# Patient Record
Sex: Male | Born: 1935 | Race: White | Hispanic: No | Smoking: Former smoker
Health system: Southern US, Community
[De-identification: ages and names within clinical notes are randomized; demographics above are authoritative.]

## PROBLEM LIST (undated history)

## (undated) DIAGNOSIS — C3490 Malignant neoplasm of unspecified part of unspecified bronchus or lung: Secondary | ICD-10-CM

## (undated) DIAGNOSIS — K219 Gastro-esophageal reflux disease without esophagitis: Secondary | ICD-10-CM

## (undated) DIAGNOSIS — E785 Hyperlipidemia, unspecified: Secondary | ICD-10-CM

## (undated) DIAGNOSIS — J449 Chronic obstructive pulmonary disease, unspecified: Secondary | ICD-10-CM

## (undated) DIAGNOSIS — I509 Heart failure, unspecified: Secondary | ICD-10-CM

## (undated) DIAGNOSIS — I214 Non-ST elevation (NSTEMI) myocardial infarction: Secondary | ICD-10-CM

## (undated) HISTORY — PX: FRACTURE SURGERY: SHX138

## (undated) HISTORY — PX: ANKLE FRACTURE SURGERY: SHX122

## (undated) HISTORY — PX: CATARACT EXTRACTION: SUR2

## (undated) HISTORY — PX: LUNG BIOPSY: SHX232

---

## 2007-05-24 DIAGNOSIS — C3490 Malignant neoplasm of unspecified part of unspecified bronchus or lung: Secondary | ICD-10-CM

## 2007-05-24 HISTORY — DX: Malignant neoplasm of unspecified part of unspecified bronchus or lung: C34.90

## 2008-05-20 ENCOUNTER — Ambulatory Visit: Payer: Self-pay | Admitting: Cardiology

## 2008-11-10 ENCOUNTER — Ambulatory Visit: Payer: Self-pay | Admitting: Cardiology

## 2009-01-21 ENCOUNTER — Ambulatory Visit (HOSPITAL_COMMUNITY): Admission: RE | Admit: 2009-01-21 | Discharge: 2009-01-21 | Payer: Self-pay | Admitting: Optometry

## 2009-02-04 ENCOUNTER — Ambulatory Visit: Payer: Self-pay | Admitting: Thoracic Surgery

## 2009-02-10 ENCOUNTER — Encounter (INDEPENDENT_AMBULATORY_CARE_PROVIDER_SITE_OTHER): Payer: Self-pay | Admitting: Interventional Radiology

## 2009-02-10 ENCOUNTER — Ambulatory Visit (HOSPITAL_COMMUNITY): Admission: RE | Admit: 2009-02-10 | Discharge: 2009-02-10 | Payer: Self-pay | Admitting: Thoracic Surgery

## 2009-02-11 ENCOUNTER — Ambulatory Visit: Payer: Self-pay | Admitting: Thoracic Surgery

## 2009-02-25 ENCOUNTER — Ambulatory Visit: Admission: RE | Admit: 2009-02-25 | Discharge: 2009-03-31 | Payer: Self-pay | Admitting: Radiation Oncology

## 2009-05-14 ENCOUNTER — Ambulatory Visit: Admission: RE | Admit: 2009-05-14 | Discharge: 2009-05-14 | Payer: Self-pay | Admitting: Radiation Oncology

## 2009-05-14 LAB — CBC WITH DIFFERENTIAL/PLATELET
Basophils Absolute: 0 10*3/uL (ref 0.0–0.1)
Eosinophils Absolute: 0.1 10*3/uL (ref 0.0–0.5)
HCT: 49 % (ref 38.4–49.9)
HGB: 16.2 g/dL (ref 13.0–17.1)
LYMPH%: 17.6 % (ref 14.0–49.0)
MCV: 90.2 fL (ref 79.3–98.0)
MONO#: 0.7 10*3/uL (ref 0.1–0.9)
MONO%: 10.9 % (ref 0.0–14.0)
NEUT#: 4.2 10*3/uL (ref 1.5–6.5)
NEUT%: 69.2 % (ref 39.0–75.0)
Platelets: 156 10*3/uL (ref 140–400)
RBC: 5.43 10*6/uL (ref 4.20–5.82)
WBC: 6.1 10*3/uL (ref 4.0–10.3)

## 2009-05-14 LAB — BUN: BUN: 14 mg/dL (ref 6–23)

## 2010-08-27 LAB — CBC
HCT: 47.5 % (ref 39.0–52.0)
Hemoglobin: 15.8 g/dL (ref 13.0–17.0)
MCHC: 33.2 g/dL (ref 30.0–36.0)
MCV: 87.6 fL (ref 78.0–100.0)
RBC: 5.42 MIL/uL (ref 4.22–5.81)
RDW: 17.6 % — ABNORMAL HIGH (ref 11.5–15.5)

## 2010-10-05 NOTE — Letter (Signed)
February 11, 2009   Elise Benne, MD  [address], [address]  [place], [place] [address]   Re:  Paul Snyder, Paul Snyder                DOB:  02-Jan-1936   Dear Dr. Orson Aloe:   I saw the patient back for followup today.  He is doing remarkably well  after his needle biopsy and unfortunately the biopsy showed  adenocarcinoma.  Since his lung functions are borderline, I already had  recommended that he get SBRT treatment for this lung cancer, which is a  clinically stage I.  The patient is in agreement with this.  I will  refer him to Dr. Lurline Hare for evaluation.  I appreciate the  opportunity of seeing the patient.   Sincerely,   Paul Snyder, M.D.  Electronically Signed   DPB/MEDQ  D:  02/11/2009  T:  02/12/2009  Job:  [id number]

## 2010-10-05 NOTE — Letter (Signed)
February 04, 2009   Paul Snyder  [address] [place], [place] [address]   Re:  VIRLAN, KEMPKER                DOB:  March 07, 1936   Dear Dr. Orson Aloe:   I saw the patient in the office today.  This 75 year old patient has a  long history of smoking and has severe obstructive pulmonary disease  with an diffusion capacity of 40% and an FEV-1 of 1.02 or 33% predicted  with an FVC of 1.81 or 39% predicted.  According to your notes, he is a  GOLD stage III.  He gets short of breath when walking up stairs.  He is  found to have a left upper lobe 2 or 3-cm lesion.  PET scan is positive  with an standard uptake value of 2.9.  He has had no fever, chills,  excessive sputum, or no hemoptysis.  He is referred here for evaluation.   MEDICATIONS:  Aspirin, Spiriva, Advair, Lasix 20 mg, Simvastatin 40 mg,  and nystatin.   ALLERGIES:  He has no allergies.   PAST MEDICAL HISTORY:  He has hypercholesterolemia, congestive heart  failure, chronic obstructive pulmonary disease, coronary artery disease.   FAMILY HISTORY:  Noncontributory.   SOCIAL HISTORY:  He is married, he has 2 children, he is retired, quit  smoking in 1996 and does not drink alcohol on a regular basis.   REVIEW OF SYSTEMS:  VITAL SIGNS:  He is 6 feet.  He is 236 pounds, his  weight has been stable.  CARDIAC:  No angina or atrial fibrillation.  PULMONARY:  He has bronchitis.  GI:  No nausea, or vomiting.  No hiatal hernia.  GU:  No kidney disease, dysuria or frequent urination.  VASCULAR:  No claudication, DVT or TIAs.  NEUROLOGIC:  No dizziness headaches, blackouts, or seizures.  MUSCULOSKELETAL:  Some arthralgias and arthritis.  PSYCHIATRIC:  No depression or nervousness.  EYE/ENT:  No changes in his eyesight or hearing.  HEMATOLOGIC:  No problems with bleeding, clotting disorders or anemia.   PHYSICAL EXAMINATION:  Vital Signs:  His blood pressure is 145/79, pulse  69, respirations 18, sats were 90% on room  air.  Head, Eyes, Ears, Nose  and Throat:  Unremarkable.  Neck:  Supple without thyromegaly.  There is  no supraclavicular or axillary adenopathy.  Chest:  Clear to  auscultation and percussion.  Heart:  Regular sinus rhythm.  No murmurs.  Abdomen:  Soft.  There is no hepatosplenomegaly.  Extremities:  Pulses  are 2+.  There is no clubbing or edema.  Neurologic:  He is oriented x3.  Sense and motor intact.  Cranial nerves intact.   He probably has non-small-cell lung cancer but we need to prove this.  Since he had a negative bronchoscopy, I think the best way to prove this  is with a needle biopsy, although it does carry some risk of  pneumothorax and hemoptysis.  I have scheduled him to have this done  here at the [facility] with our radiologist.  If that is positive  then I will refer him for SBRT.  I think this type of radiation therapy  would be beneficial with minimum loss of his lung capacity.  I  appreciate the opportunity of seeing the patient.   Sincerely,   Ines Bloomer, M.D.  Electronically Signed   DPB/MEDQ  D:  02/04/2009  T:  02/05/2009  Job:  [id number]

## 2016-07-19 ENCOUNTER — Encounter (INDEPENDENT_AMBULATORY_CARE_PROVIDER_SITE_OTHER): Payer: Self-pay

## 2017-07-16 DIAGNOSIS — I214 Non-ST elevation (NSTEMI) myocardial infarction: Secondary | ICD-10-CM

## 2017-07-16 HISTORY — DX: Non-ST elevation (NSTEMI) myocardial infarction: I21.4

## 2017-07-19 ENCOUNTER — Encounter (HOSPITAL_COMMUNITY): Payer: Self-pay | Admitting: Emergency Medicine

## 2017-07-19 ENCOUNTER — Observation Stay (HOSPITAL_COMMUNITY)
Admission: EM | Admit: 2017-07-19 | Discharge: 2017-07-20 | Disposition: A | Discharge status: Home / Self Care | Payer: Medicare Other | Attending: Interventional Cardiology | Admitting: Interventional Cardiology

## 2017-07-19 ENCOUNTER — Ambulatory Visit (HOSPITAL_COMMUNITY)
Admission: EM | Disposition: A | Discharge status: Home / Self Care | Payer: Self-pay | Source: Home / Self Care | Attending: Emergency Medicine

## 2017-07-19 ENCOUNTER — Other Ambulatory Visit: Payer: Self-pay

## 2017-07-19 ENCOUNTER — Emergency Department (HOSPITAL_COMMUNITY): Payer: Medicare Other

## 2017-07-19 DIAGNOSIS — I2584 Coronary atherosclerosis due to calcified coronary lesion: Secondary | ICD-10-CM | POA: Insufficient documentation

## 2017-07-19 DIAGNOSIS — I252 Old myocardial infarction: Secondary | ICD-10-CM | POA: Diagnosis present

## 2017-07-19 DIAGNOSIS — I4510 Unspecified right bundle-branch block: Secondary | ICD-10-CM | POA: Insufficient documentation

## 2017-07-19 DIAGNOSIS — Z85118 Personal history of other malignant neoplasm of bronchus and lung: Secondary | ICD-10-CM | POA: Diagnosis not present

## 2017-07-19 DIAGNOSIS — I2510 Atherosclerotic heart disease of native coronary artery without angina pectoris: Secondary | ICD-10-CM | POA: Diagnosis not present

## 2017-07-19 DIAGNOSIS — Z7982 Long term (current) use of aspirin: Secondary | ICD-10-CM | POA: Diagnosis not present

## 2017-07-19 DIAGNOSIS — I509 Heart failure, unspecified: Secondary | ICD-10-CM | POA: Diagnosis not present

## 2017-07-19 DIAGNOSIS — I214 Non-ST elevation (NSTEMI) myocardial infarction: Principal | ICD-10-CM | POA: Insufficient documentation

## 2017-07-19 DIAGNOSIS — Z87891 Personal history of nicotine dependence: Secondary | ICD-10-CM | POA: Diagnosis not present

## 2017-07-19 DIAGNOSIS — Z955 Presence of coronary angioplasty implant and graft: Secondary | ICD-10-CM

## 2017-07-19 DIAGNOSIS — I10 Essential (primary) hypertension: Secondary | ICD-10-CM

## 2017-07-19 DIAGNOSIS — Z79899 Other long term (current) drug therapy: Secondary | ICD-10-CM | POA: Insufficient documentation

## 2017-07-19 DIAGNOSIS — Z8249 Family history of ischemic heart disease and other diseases of the circulatory system: Secondary | ICD-10-CM | POA: Insufficient documentation

## 2017-07-19 DIAGNOSIS — E782 Mixed hyperlipidemia: Secondary | ICD-10-CM | POA: Diagnosis not present

## 2017-07-19 DIAGNOSIS — E785 Hyperlipidemia, unspecified: Secondary | ICD-10-CM | POA: Insufficient documentation

## 2017-07-19 HISTORY — PX: CORONARY ANGIOPLASTY WITH STENT PLACEMENT: SHX49

## 2017-07-19 HISTORY — DX: Hyperlipidemia, unspecified: E78.5

## 2017-07-19 HISTORY — DX: Heart failure, unspecified: I50.9

## 2017-07-19 HISTORY — PX: CORONARY STENT INTERVENTION: CATH118234

## 2017-07-19 HISTORY — DX: Gastro-esophageal reflux disease without esophagitis: K21.9

## 2017-07-19 HISTORY — PX: LEFT HEART CATH AND CORONARY ANGIOGRAPHY: CATH118249

## 2017-07-19 HISTORY — DX: Malignant neoplasm of unspecified part of unspecified bronchus or lung: C34.90

## 2017-07-19 HISTORY — DX: Chronic obstructive pulmonary disease, unspecified: J44.9

## 2017-07-19 HISTORY — DX: Non-ST elevation (NSTEMI) myocardial infarction: I21.4

## 2017-07-19 LAB — BASIC METABOLIC PANEL
Anion gap: 12 (ref 5–15)
BUN: 12 mg/dL (ref 6–20)
CHLORIDE: 101 mmol/L (ref 101–111)
CO2: 26 mmol/L (ref 22–32)
CREATININE: 1.05 mg/dL (ref 0.61–1.24)
Calcium: 9.5 mg/dL (ref 8.9–10.3)
GFR calc Af Amer: >60 mL/min (ref >60)
GFR calc non Af Amer: >60 mL/min (ref >60)
GLUCOSE: 105 mg/dL — AB (ref 65–99)
POTASSIUM: 3.6 mmol/L (ref 3.5–5.1)
SODIUM: 139 mmol/L (ref 135–145)

## 2017-07-19 LAB — CBC
HEMATOCRIT: 46.4 % (ref 39.0–52.0)
Hemoglobin: 15 g/dL (ref 13.0–17.0)
MCH: 29.4 pg (ref 26.0–34.0)
MCHC: 32.3 g/dL (ref 30.0–36.0)
MCV: 91 fL (ref 78.0–100.0)
PLATELETS: 208 10*3/uL (ref 150–400)
RBC: 5.1 MIL/uL (ref 4.22–5.81)
RDW: 13.5 % (ref 11.5–15.5)
WBC: 6.8 10*3/uL (ref 4.0–10.5)

## 2017-07-19 LAB — TROPONIN I: TROPONIN I: 0.14 ng/mL — AB (ref <0.03)

## 2017-07-19 LAB — I-STAT TROPONIN, ED: Troponin i, poc: 0.1 ng/mL (ref 0.00–0.08)

## 2017-07-19 SURGERY — LEFT HEART CATH AND CORONARY ANGIOGRAPHY
Anesthesia: LOCAL

## 2017-07-19 MED ORDER — LIDOCAINE HCL (PF) 1 % IJ SOLN
INTRAMUSCULAR | Status: DC | PRN
  Administered 2017-07-19: 2 mL

## 2017-07-19 MED ORDER — SODIUM CHLORIDE 0.9% FLUSH: 3.0000 mL | INTRAVENOUS | Status: DC | PRN

## 2017-07-19 MED ORDER — VERAPAMIL HCL 2.5 MG/ML IV SOLN
INTRAVENOUS | Status: DC | PRN
  Administered 2017-07-19: 10 mL via INTRA_ARTERIAL

## 2017-07-19 MED ORDER — LIDOCAINE HCL 1 % IJ SOLN
INTRAMUSCULAR | Status: AC
  Filled 2017-07-19: qty 20

## 2017-07-19 MED ORDER — ASPIRIN EC 81 MG PO TBEC
81.0000 mg | DELAYED_RELEASE_TABLET | Freq: Every day | ORAL | Status: DC
  Administered 2017-07-20: 10:00:00 81 mg via ORAL
  Filled 2017-07-19: qty 1

## 2017-07-19 MED ORDER — SODIUM CHLORIDE 0.9% FLUSH: 3.0000 mL | Freq: Two times a day (BID) | INTRAVENOUS | Status: DC

## 2017-07-19 MED ORDER — IOPAMIDOL (ISOVUE-370) INJECTION 76%
INTRAVENOUS | Status: AC
  Filled 2017-07-19: qty 100

## 2017-07-19 MED ORDER — HEPARIN (PORCINE) IN NACL 2-0.9 UNIT/ML-% IJ SOLN
INTRAMUSCULAR | Status: AC
  Filled 2017-07-19: qty 1000

## 2017-07-19 MED ORDER — IOPAMIDOL (ISOVUE-370) INJECTION 76%
INTRAVENOUS | Status: DC | PRN
  Administered 2017-07-19: 175 mL via INTRA_ARTERIAL

## 2017-07-19 MED ORDER — ATORVASTATIN CALCIUM 80 MG PO TABS: 80.0000 mg | ORAL_TABLET | Freq: Every day | ORAL | Status: DC

## 2017-07-19 MED ORDER — TICAGRELOR 90 MG PO TABS
ORAL_TABLET | ORAL | Status: DC | PRN
  Administered 2017-07-19: 180 mg via ORAL

## 2017-07-19 MED ORDER — ONDANSETRON HCL 4 MG/2ML IJ SOLN: 4.0000 mg | Freq: Four times a day (QID) | INTRAMUSCULAR | Status: DC | PRN

## 2017-07-19 MED ORDER — MIDAZOLAM HCL 2 MG/2ML IJ SOLN
INTRAMUSCULAR | Status: AC
  Filled 2017-07-19: qty 2

## 2017-07-19 MED ORDER — BIVALIRUDIN TRIFLUOROACETATE 250 MG IV SOLR
INTRAVENOUS | Status: AC
  Filled 2017-07-19: qty 250

## 2017-07-19 MED ORDER — ACETAMINOPHEN 325 MG PO TABS: 650.0000 mg | ORAL_TABLET | ORAL | Status: DC | PRN

## 2017-07-19 MED ORDER — SODIUM CHLORIDE 0.9 % IV SOLN: INTRAVENOUS | Status: DC

## 2017-07-19 MED ORDER — SIMVASTATIN 40 MG PO TABS: 40.0000 mg | ORAL_TABLET | Freq: Every day | ORAL | Status: DC

## 2017-07-19 MED ORDER — TICAGRELOR 90 MG PO TABS
90.0000 mg | ORAL_TABLET | Freq: Two times a day (BID) | ORAL | Status: DC
  Administered 2017-07-20: 06:00:00 90 mg via ORAL
  Filled 2017-07-19: qty 1

## 2017-07-19 MED ORDER — HEART ATTACK BOUNCING BOOK
Freq: Once | Status: AC
  Administered 2017-07-19: 1
  Filled 2017-07-19: qty 1

## 2017-07-19 MED ORDER — HEPARIN SODIUM (PORCINE) 1000 UNIT/ML IJ SOLN
INTRAMUSCULAR | Status: DC | PRN
  Administered 2017-07-19: 4200 [IU] via INTRAVENOUS

## 2017-07-19 MED ORDER — NITROGLYCERIN 0.4 MG SL SUBL: 0.4000 mg | SUBLINGUAL_TABLET | SUBLINGUAL | Status: DC | PRN

## 2017-07-19 MED ORDER — ALBUTEROL SULFATE (2.5 MG/3ML) 0.083% IN NEBU
2.5000 mL | INHALATION_SOLUTION | Freq: Four times a day (QID) | RESPIRATORY_TRACT | Status: DC
  Administered 2017-07-19: 2.5 mL via RESPIRATORY_TRACT
  Filled 2017-07-19 (×3): qty 3

## 2017-07-19 MED ORDER — ANGIOPLASTY BOOK
Freq: Once | Status: AC
  Administered 2017-07-19: 1
  Filled 2017-07-19: qty 1

## 2017-07-19 MED ORDER — SODIUM CHLORIDE 0.9 % IV SOLN
INTRAVENOUS | Status: AC | PRN
  Administered 2017-07-19 (×2): 1.75 mg/kg/h via INTRAVENOUS

## 2017-07-19 MED ORDER — FENTANYL CITRATE (PF) 100 MCG/2ML IJ SOLN
INTRAMUSCULAR | Status: AC
  Filled 2017-07-19: qty 2

## 2017-07-19 MED ORDER — HYDRALAZINE HCL 20 MG/ML IJ SOLN: 5.0000 mg | INTRAMUSCULAR | Status: AC | PRN

## 2017-07-19 MED ORDER — HEPARIN BOLUS VIA INFUSION
4000.0000 [IU] | Freq: Once | INTRAVENOUS | Status: DC
  Filled 2017-07-19: qty 4000

## 2017-07-19 MED ORDER — METOPROLOL TARTRATE 12.5 MG HALF TABLET
12.5000 mg | ORAL_TABLET | Freq: Two times a day (BID) | ORAL | Status: DC
  Administered 2017-07-19 – 2017-07-20 (×2): 12.5 mg via ORAL
  Filled 2017-07-19 (×2): qty 1

## 2017-07-19 MED ORDER — MIDAZOLAM HCL 2 MG/2ML IJ SOLN
INTRAMUSCULAR | Status: DC | PRN
  Administered 2017-07-19 (×2): 1 mg via INTRAVENOUS

## 2017-07-19 MED ORDER — ENSURE ENLIVE PO LIQD
237.0000 mL | Freq: Two times a day (BID) | ORAL | Status: DC
  Administered 2017-07-20: 10:00:00 237 mL via ORAL
  Filled 2017-07-19 (×4): qty 237

## 2017-07-19 MED ORDER — HEPARIN SODIUM (PORCINE) 1000 UNIT/ML IJ SOLN
INTRAMUSCULAR | Status: AC
  Filled 2017-07-19: qty 1

## 2017-07-19 MED ORDER — ASPIRIN 81 MG PO CHEW
81.0000 mg | CHEWABLE_TABLET | Freq: Every day | ORAL | Status: DC
  Filled 2017-07-19: qty 1

## 2017-07-19 MED ORDER — BIVALIRUDIN BOLUS VIA INFUSION - CUPID
INTRAVENOUS | Status: DC | PRN
  Administered 2017-07-19: 62.925 mg via INTRAVENOUS

## 2017-07-19 MED ORDER — NITROGLYCERIN 1 MG/10 ML FOR IR/CATH LAB
INTRA_ARTERIAL | Status: DC | PRN
  Administered 2017-07-19 (×4): 200 ug via INTRACORONARY

## 2017-07-19 MED ORDER — LABETALOL HCL 5 MG/ML IV SOLN: 10.0000 mg | INTRAVENOUS | Status: AC | PRN

## 2017-07-19 MED ORDER — SODIUM CHLORIDE 0.9 % IV SOLN: 250.0000 mL | INTRAVENOUS | Status: DC | PRN

## 2017-07-19 MED ORDER — VERAPAMIL HCL 2.5 MG/ML IV SOLN
INTRAVENOUS | Status: AC
  Filled 2017-07-19: qty 2

## 2017-07-19 MED ORDER — ACETAMINOPHEN 325 MG PO TABS
650.0000 mg | ORAL_TABLET | ORAL | Status: DC | PRN
  Administered 2017-07-19: 650 mg via ORAL
  Filled 2017-07-19: qty 2

## 2017-07-19 MED ORDER — FENTANYL CITRATE (PF) 100 MCG/2ML IJ SOLN
INTRAMUSCULAR | Status: DC | PRN
  Administered 2017-07-19 (×2): 25 ug via INTRAVENOUS

## 2017-07-19 MED ORDER — HEPARIN (PORCINE) IN NACL 100-0.45 UNIT/ML-% IJ SOLN
1050.0000 [IU]/h | INTRAMUSCULAR | Status: DC
  Filled 2017-07-19: qty 250

## 2017-07-19 MED ORDER — NITROGLYCERIN 1 MG/10 ML FOR IR/CATH LAB
INTRA_ARTERIAL | Status: AC
  Filled 2017-07-19: qty 10

## 2017-07-19 MED ORDER — SODIUM CHLORIDE 0.9 % IV SOLN
INTRAVENOUS | Status: AC | PRN
  Administered 2017-07-19: 75 mL/h via INTRAVENOUS

## 2017-07-19 SURGICAL SUPPLY — 16 items
BALLN EMERGE MR 2.5X12 (BALLOONS) ×2
BALLN SAPPHIRE ~~LOC~~ 3.25X12 (BALLOONS) ×2 IMPLANT
BALLOON EMERGE MR 2.5X12 (BALLOONS) ×1 IMPLANT
CATH OPTITORQUE TIG 4.0 5F (CATHETERS) ×2 IMPLANT
CATH VISTA GUIDE 6FR XB3.5 (CATHETERS) ×2 IMPLANT
DEVICE RAD COMP TR BAND LRG (VASCULAR PRODUCTS) ×2 IMPLANT
GLIDESHEATH SLEND SS 6F .021 (SHEATH) ×2 IMPLANT
GUIDEWIRE INQWIRE 1.5J.035X260 (WIRE) ×1 IMPLANT
INQWIRE 1.5J .035X260CM (WIRE) ×2
KIT ENCORE 26 ADVANTAGE (KITS) ×2 IMPLANT
KIT HEART LEFT (KITS) ×2 IMPLANT
PACK CARDIAC CATHETERIZATION (CUSTOM PROCEDURE TRAY) ×2 IMPLANT
STENT SYNERGY DES 3X16 (Permanent Stent) ×2 IMPLANT
TRANSDUCER W/STOPCOCK (MISCELLANEOUS) ×2 IMPLANT
TUBING CIL FLEX 10 FLL-RA (TUBING) ×2 IMPLANT
WIRE PT2 MS 185 (WIRE) ×2 IMPLANT

## 2017-07-19 NOTE — Interval H&P Note (Signed)
Cath Lab Visit (complete for each Cath Lab visit)  Clinical Evaluation Leading to the Procedure:   ACS: Yes.    Non-ACS:    Anginal Classification: CCS IV  Anti-ischemic medical therapy: No Therapy  Non-Invasive Test Results: No non-invasive testing performed  Prior CABG: No previous CABG      History and Physical Interval Note:  07/19/2017 4:26 PM  Paul Snyder  has presented today for surgery, with the diagnosis of cp  The various methods of treatment have been discussed with the patient and family. After consideration of risks, benefits and other options for treatment, the patient has consented to  Procedure(s): LEFT HEART CATH AND CORONARY ANGIOGRAPHY (N/A) as a surgical intervention .  The patient's history has been reviewed, patient examined, no change in status, stable for surgery.  I have reviewed the patient's chart and labs.  Questions were answered to the patient's satisfaction.     Shelva Majestic

## 2017-07-19 NOTE — Progress Notes (Signed)
ANTICOAGULATION CONSULT NOTE - Initial Consult  Pharmacy Consult for heparin Indication: chest pain/ACS  Not on File  Patient Measurements: Height: 6' (182.9 cm) Weight: 185 lb (83.9 kg) IBW/kg (Calculated) : 77.6 Heparin Dosing Weight: 84kg  Vital Signs: Temp: 97.5 F (36.4 C) (02/27 1128) Temp Source: Oral (02/27 1128) BP: 169/65 (02/27 1530) Pulse Rate: 58 (02/27 1530)  Labs: Recent Labs    07/19/17 1142  HGB 15.0  HCT 46.4  PLT 208  CREATININE 1.05    Estimated Creatinine Clearance: 61.6 mL/min (by C-G formula based on SCr of 1.05 mg/dL). Assessment:  58 YOM here with chest pain that started on Sunday. Cardiology planning to take him for cath. No anticoagulation PTA.  Goal of Therapy:  Heparin level 0.3-0.7 units/ml Monitor platelets by anticoagulation protocol: Yes   Plan:  Heparin bolus 4000 units IV x1, then start infusion at 1050 units/hr Heparin level in 8h Daily heparin level and CBC Follow post-cath  Liev Brockbank D. Damian Hofstra, PharmD, BCPS Clinical Pharmacist 07/19/2017 4:24 PM

## 2017-07-19 NOTE — ED Notes (Addendum)
Patient states that he had chest pain off and on Saturday and Sunday night.  Chest pain has not occurred since.  Describes pain as tightness.  Denies any chest pain at this moment.  Denies worsening SOB, lightheadedness, or diaphoresis.  Does say that when he gets up in the morning, he feels "swimmy headed".

## 2017-07-19 NOTE — H&P (Addendum)
History & Physical    Patient ID: Paul Snyder MRN: [id number], DOB/AGE: 07/27/1935   Admit date: 07/19/2017  Primary Physician: System, Pcp Not In Primary Cardiologist: New   Patient Profile    82 yo male with PMH of HL, CHF, remote Lung CA with left sided thoracotomy and remote tobacco use who presented with chest pain.   Past Medical History   Past Medical History:  Diagnosis Date  . Cancer ([place]) 2009   lung  . CHF (congestive heart failure) ([place])   . Hyperlipidemia     History reviewed. No pertinent surgical history.   Allergies  Not on File  History of Present Illness    Mr. Maraj is an 82 yo male with PMH of HL, CHF, remote Lung CA with left sided thoracotomy and tobacco use. Reports he was seen by a cardiologist back in the 90s and told he had CHF. No cardiac issues since that time. Followed by his PCP. Currently lives at home. Works around this home, mows grass, and works in his garden. Does not normally have any anginal symptoms.   Saturday night he was awoken with left sided chest pain that lasted a couple of minutes, subsided and he was able to fall back asleep. Sunday afternoon took a nap around noon and woke up with chest tightness again, but subsided quickly. Then Sunday night had terrible chest tightness, felt like he may die. Prayed and the pain went away in several minutes. Told his children about his symptoms and they brought him to the ED today for evaluation. No further chest pain since Sunday night. Does report family hx of CAD with brother having stents in his early 72s.   In the ED his labs showed stable electrolytes, Cr 1.05, POC trop 0.10, Hgb 15.0. CXR without acute edema. EKG showed SB with RBBB and 1st degree AVB. No chest pain at the time of exam.   Home Medications    Prior to Admission medications   Medication Sig Start Date End Date Taking? Authorizing Provider  albuterol (PROVENTIL HFA;VENTOLIN HFA) 108 (90 Base) MCG/ACT inhaler Inhale 2  puffs into the lungs 4 (four) times daily.    Yes [provider]  aspirin 325 MG EC tablet Take 325 mg by mouth daily.   Yes [provider]  furosemide (LASIX) 20 MG tablet Take 20 mg by mouth daily.   Yes [provider]  meclizine (ANTIVERT) 25 MG tablet Take 25 mg by mouth daily as needed for dizziness. 06/06/17  Yes [provider]  niacin 500 MG tablet Take 500 mg by mouth daily.    Yes [provider]  pantoprazole (PROTONIX) 40 MG tablet Take 40 mg by mouth daily.   Yes [provider]  simvastatin (ZOCOR) 40 MG tablet Take 40 mg by mouth at bedtime.    Yes [provider]    Family History    Family History  Problem Relation Age of Onset  . CAD Brother     Social History    Social History   Socioeconomic History  . Marital status: Married    Spouse name: Not on file  . Number of children: Not on file  . Years of education: Not on file  . Highest education level: Not on file  Social Needs  . Financial resource strain: Not on file  . Food insecurity - worry: Not on file  . Food insecurity - inability: Not on file  . Transportation needs - medical:  Not on file  . Transportation needs - non-medical: Not on file  Occupational History  . Not on file  Tobacco Use  . Smoking status: Former Smoker    Types: Cigarettes    Last attempt to quit: 1996    Years since quitting: 23.1  . Smokeless tobacco: Never Used  Substance and Sexual Activity  . Alcohol use: No    Frequency: Never  . Drug use: No  . Sexual activity: No  Other Topics Concern  . Not on file  Social History Narrative  . Not on file     Review of Systems    See HPI  All other systems reviewed and are otherwise negative except as noted above.  Physical Exam    Blood pressure (!) 169/65, pulse (!) 58, temperature (!) 97.5 F (36.4 C), temperature source Oral, resp. rate 16, height 6' (1.829 m), weight 185 lb (83.9 kg), SpO2 97 %.    General: Pleasant, NAD Psych: Normal affect. Neuro: Alert and oriented X 3. Moves all extremities spontaneously. HEENT: Normal  Neck: Supple without bruits or JVD. Lungs:  Resp regular and unlabored, crackles in the lower lobes. Heart: RRR no s3, s4, or murmurs. Abdomen: Soft, non-tender, non-distended, BS + x 4.  Extremities: No clubbing, cyanosis or edema. DP/PT/Radials 2+ and equal bilaterally.  Labs    Troponin [facility] Test) Recent Labs    07/19/17 1212  TROPIPOC 0.10*   No results for input(s): CKTOTAL, CKMB, TROPONINI in the last 72 hours. Lab Results  Component Value Date   WBC 6.8 07/19/2017   HGB 15.0 07/19/2017   HCT 46.4 07/19/2017   MCV 91.0 07/19/2017   PLT 208 07/19/2017    Recent Labs  Lab 07/19/17 1142  NA 139  K 3.6  CL 101  CO2 26  BUN 12  CREATININE 1.05  CALCIUM 9.5  GLUCOSE 105*   No results found for: CHOL, HDL, LDLCALC, TRIG No results found for: [facility]   Radiology Studies    Dg Chest 2 View  Result Date: 07/19/2017 CLINICAL DATA:  Chest pain and productive cough for 2-3 weeks. EXAM: CHEST  2 VIEW COMPARISON:  Chest CT 01/13/2011 FINDINGS: The cardiac silhouette, mediastinal and hilar contours are within normal limits and stable. There is mild tortuosity and calcification of the thoracic aorta. Emphysematous changes are noted with hyperinflation and attenuation of the pulmonary vasculature. Biapical pleural and parenchymal scarring changes. Evidence of prior left-sided thoracotomy and radiation changes in the left upper lobe. When compared to the prior scout film from the CT scan of 2012 there appears to be slightly more rounded fullness in the area of radiation change. Could not exclude the possibility of recurrent tumor. Chest CT with contrast may be helpful for further evaluation. No infiltrates or effusions. The bony thorax is intact. Stable appearing lower thoracic compression fractures are noted. IMPRESSION: No active cardiopulmonary  disease. Electronically Signed   By: Marijo Sanes M.D.   On: 07/19/2017 12:17    ECG & Cardiac Imaging    EKG: SB with RBBB, LAFB and 1st degree AVB  Assessment & Plan    82 yo male with PMH of HL, CHF, remote Lung CA with left sided thoracotomy and remote tobacco use who presented with chest pain.   1. NSTEMI: Symptoms concerning for ACS. Family hx of CAD, with RFs of remote tobacco use, and HL. POC trop 0.10. Given findings will proceed with cardiac cath. The patient understands that risks included but are  not limited to stroke (1 in 1000), death (1 in 58), kidney failure [usually temporary] (1 in 500), bleeding (1 in 200), allergic reaction [possibly serious] (1 in 200).  -- IV heparin -- cycle troponins -- check echo  2. HL: on simvastatin 40mg  daily -- check lipids in am  3. CHF: reported back in the early 90s. No overt signs of volume overload on exam.  -- check echo  4. Remote hx of Lung Ca s/p left sided thoracotomy   Signed, Reino Bellis, NP-C Pager [phone] 07/19/2017, 4:02 PM   I have examined the patient and reviewed assessment and plan and discussed with patient.  Agree with above as stated.  NSTEMI.  Discussed cath with the patient.  He is willing to proceed. Cardiac catheterization was discussed with the patient fully. The patient understands that risks include but are not limited to stroke (1 in 1000), death (1 in 2), kidney failure [usually temporary] (1 in 500), bleeding (1 in 200), allergic reaction [possibly serious] (1 in 200).  The patient understands and is willing to proceed.    All questions answered.   Start IV heparin.  Right radial pulse palpable, 2+.    H/o CHF. Medical treatment for hyperlipidemia.  Larae Grooms

## 2017-07-19 NOTE — ED Triage Notes (Signed)
Per Pt; Chest tightness started Saturday PM. Pain started to get better sun morning but returned worse Sun night. Since Sun night pt reports no pain/tighness. Pt A&Ox4, ambulatory, Hx of CHF and took 324 of ASP this AM.  R and Central Chest.

## 2017-07-19 NOTE — ED Provider Notes (Signed)
[facility] EMERGENCY DEPARTMENT Provider Note   CSN: [id number] Arrival date & time: 07/19/17  1120     History   Chief Complaint Chief Complaint  Patient presents with  . Chest Pain    HPI Paul Snyder is a 82 y.o. male.  Chief complaint is chest pain 2 days ago.  HPI 82 year old male.  Distant history of tobacco use.  He stopped in 1993 after an episode of CHF.  Also history of lung cancer status post left thoracotomy and resection.  No history of known coronary artery disease.  Today is Wednesday.  He states on Saturday night he had an episode of pain in his anterior chest that radiated to the base of his neck.  This happened again Sunday night and was "much much worse".  He states that he prayed "that if it was going to me and by the time that the Reita Cliche take me quickly".  He has been pain-free since but told his family about it last night and presents now for evaluation.  Past Medical History:  Diagnosis Date  . Cancer ([place]) 2009   lung  . CHF (congestive heart failure) ([place])   . Hyperlipidemia     Patient Active Problem List   Diagnosis Date Noted  . NSTEMI (non-ST elevated myocardial infarction) ([place]) 07/19/2017    History reviewed. No pertinent surgical history.     Home Medications    Prior to Admission medications   Medication Sig Start Date End Date Taking? Authorizing Provider  albuterol (PROVENTIL HFA;VENTOLIN HFA) 108 (90 Base) MCG/ACT inhaler Inhale 2 puffs into the lungs 4 (four) times daily.    Yes [provider]  aspirin 325 MG EC tablet Take 325 mg by mouth daily.   Yes [provider]  furosemide (LASIX) 20 MG tablet Take 20 mg by mouth daily.   Yes [provider]  meclizine (ANTIVERT) 25 MG tablet Take 25 mg by mouth daily as needed for dizziness. 06/06/17  Yes [provider]  niacin 500 MG tablet Take 500 mg by mouth daily.    Yes [provider]  pantoprazole (PROTONIX) 40 MG  tablet Take 40 mg by mouth daily.   Yes [provider]  simvastatin (ZOCOR) 40 MG tablet Take 40 mg by mouth at bedtime.    Yes [provider]    Family History Family History  Problem Relation Age of Onset  . CAD Brother     Social History Social History   Tobacco Use  . Smoking status: Former Smoker    Types: Cigarettes    Last attempt to quit: 1996    Years since quitting: 23.1  . Smokeless tobacco: Never Used  Substance Use Topics  . Alcohol use: No    Frequency: Never  . Drug use: No     Allergies   Patient has no allergy information on record.   Review of Systems Review of Systems  Constitutional: Negative for appetite change, chills, diaphoresis, fatigue and fever.  HENT: Negative for mouth sores, sore throat and trouble swallowing.   Eyes: Negative for visual disturbance.  Respiratory: Negative for cough, chest tightness, shortness of breath and wheezing.   Cardiovascular: Positive for chest pain.  Gastrointestinal: Negative for abdominal distention, abdominal pain, diarrhea, nausea and vomiting.  Endocrine: Negative for polydipsia, polyphagia and polyuria.  Genitourinary: Negative for dysuria, frequency and hematuria.  Musculoskeletal: Negative for gait problem.  Skin: Negative for color change, pallor and rash.  Neurological: Negative for  dizziness, syncope, light-headedness and headaches.  Hematological: Does not bruise/bleed easily.  Psychiatric/Behavioral: Negative for behavioral problems and confusion.     Physical Exam Updated Vital Signs BP (!) 169/65   Pulse (!) 58   Temp (!) 97.5 F (36.4 C) (Oral)   Resp 16   Ht 6' (1.829 m)   Wt 83.9 kg (185 lb)   SpO2 97%   BMI 25.09 kg/m   Physical Exam  Constitutional: He is oriented to person, place, and time. He appears well-developed and well-nourished. No distress.  HENT:  Head: Normocephalic.  Eyes: Conjunctivae are normal. Pupils are equal, round, and reactive to light.  No scleral icterus.  Neck: Normal range of motion. Neck supple. No thyromegaly present.  Cardiovascular: Normal rate and regular rhythm. Exam reveals no gallop and no friction rub.  No murmur heard. Pulmonary/Chest: Effort normal and breath sounds normal. No respiratory distress. He has no wheezes. He has no rales.  Abdominal: Soft. Bowel sounds are normal. He exhibits no distension. There is no tenderness. There is no rebound.  Musculoskeletal: Normal range of motion.  Neurological: He is alert and oriented to person, place, and time.  Skin: Skin is warm and dry. No rash noted.  Psychiatric: He has a normal mood and affect. His behavior is normal.     ED Treatments / Results  Labs (all labs ordered are listed, but only abnormal results are displayed) Labs Reviewed  BASIC METABOLIC PANEL - Abnormal; Notable for the following components:      Result Value   Glucose, Bld 105 (*)    All other components within normal limits  I-STAT TROPONIN, ED - Abnormal; Notable for the following components:   Troponin i, poc 0.10 (*)    All other components within normal limits  CBC  TROPONIN I  TROPONIN I  TROPONIN I    EKG   Radiology Dg Chest 2 View  Result Date: 07/19/2017 CLINICAL DATA:  Chest pain and productive cough for 2-3 weeks. EXAM: CHEST  2 VIEW COMPARISON:  Chest CT 01/13/2011 FINDINGS: The cardiac silhouette, mediastinal and hilar contours are within normal limits and stable. There is mild tortuosity and calcification of the thoracic aorta. Emphysematous changes are noted with hyperinflation and attenuation of the pulmonary vasculature. Biapical pleural and parenchymal scarring changes. Evidence of prior left-sided thoracotomy and radiation changes in the left upper lobe. When compared to the prior scout film from the CT scan of 2012 there appears to be slightly more rounded fullness in the area of radiation change. Could not exclude the possibility of recurrent tumor. Chest CT with  contrast may be helpful for further evaluation. No infiltrates or effusions. The bony thorax is intact. Stable appearing lower thoracic compression fractures are noted. IMPRESSION: No active cardiopulmonary disease. Electronically Signed   By: Marijo Sanes M.D.   On: 07/19/2017 12:17    Procedures Procedures (including critical care time)  Medications Ordered in ED Medications  albuterol (PROVENTIL HFA;VENTOLIN HFA) 108 (90 Base) MCG/ACT inhaler 2 puff (not administered)  simvastatin (ZOCOR) tablet 40 mg (not administered)  aspirin EC tablet 81 mg (not administered)  nitroGLYCERIN (NITROSTAT) SL tablet 0.4 mg (not administered)  acetaminophen (TYLENOL) tablet 650 mg (not administered)  ondansetron (ZOFRAN) injection 4 mg (not administered)     Initial Impression / Assessment and Plan / ED Course  I have reviewed the triage vital signs and the nursing notes.  Pertinent labs & imaging results that were available during my care of the patient were  reviewed by me and considered in my medical decision making (see chart for details).  EKG Interpretation  Date/Time:  Wednesday July 19 2017 11:30:39 EST Ventricular Rate:  59 PR Interval:  232 QRS Duration: 154 QT Interval:  440 QTC Calculation: 435 R Axis:   -78 Text Interpretation: Bifascicular block.  No comparison.  No obvious ST elevation or concordant changes.     Pain-free now.  Elevated troponin.  Probable N STEMI.  EKG shows bifascicular block.  I contacted his primary care physician Dr. Edrick Oh in [place].  He does not have old EKG, nor is there one in our system.  He has had aspirin today and symptom-free here.  Care discussed with cardiology.  They are planning admission for coronary angiography.  Final Clinical Impressions(s) / ED Diagnoses   Final diagnoses:  NSTEMI (non-ST elevated myocardial infarction) [facility])    ED Discharge Orders    None       Tanna Furry, MD 07/19/17 [phone]

## 2017-07-20 ENCOUNTER — Encounter (HOSPITAL_COMMUNITY): Payer: Self-pay | Admitting: Cardiovascular Disease

## 2017-07-20 ENCOUNTER — Observation Stay (HOSPITAL_BASED_OUTPATIENT_CLINIC_OR_DEPARTMENT_OTHER): Payer: Medicare Other

## 2017-07-20 DIAGNOSIS — I10 Essential (primary) hypertension: Secondary | ICD-10-CM

## 2017-07-20 DIAGNOSIS — I214 Non-ST elevation (NSTEMI) myocardial infarction: Secondary | ICD-10-CM | POA: Diagnosis not present

## 2017-07-20 DIAGNOSIS — E782 Mixed hyperlipidemia: Secondary | ICD-10-CM | POA: Diagnosis not present

## 2017-07-20 DIAGNOSIS — E785 Hyperlipidemia, unspecified: Secondary | ICD-10-CM | POA: Diagnosis not present

## 2017-07-20 DIAGNOSIS — I2584 Coronary atherosclerosis due to calcified coronary lesion: Secondary | ICD-10-CM | POA: Diagnosis not present

## 2017-07-20 DIAGNOSIS — R072 Precordial pain: Secondary | ICD-10-CM | POA: Diagnosis not present

## 2017-07-20 DIAGNOSIS — I2510 Atherosclerotic heart disease of native coronary artery without angina pectoris: Secondary | ICD-10-CM | POA: Diagnosis not present

## 2017-07-20 LAB — BASIC METABOLIC PANEL
ANION GAP: 9 (ref 5–15)
BUN: 10 mg/dL (ref 6–20)
CHLORIDE: 104 mmol/L (ref 101–111)
CO2: 25 mmol/L (ref 22–32)
Calcium: 9 mg/dL (ref 8.9–10.3)
Creatinine, Ser: 0.84 mg/dL (ref 0.61–1.24)
GFR calc Af Amer: >60 mL/min (ref >60)
GLUCOSE: 122 mg/dL — AB (ref 65–99)
POTASSIUM: 3.2 mmol/L — AB (ref 3.5–5.1)
SODIUM: 138 mmol/L (ref 135–145)

## 2017-07-20 LAB — ECHOCARDIOGRAM COMPLETE
CHL CUP RV SYS PRESS: 23 mmHg
CHL CUP TV REG PEAK VELOCITY: 223 cm/s
EERAT: 16.89
EWDT: 324 ms
FS: 33 % (ref 28–44)
HEIGHTINCHES: 72 in
IV/PV OW: 1.22
LA diam end sys: 39 mm
LA vol A4C: 55.4 ml
LA vol: 59.7 mL
LADIAMINDEX: 1.89 cm/m2
LASIZE: 39 mm
LAVOLIN: 29 mL/m2
LV E/e' medial: 16.89
LV TDI E'MEDIAL: 6.2
LV e' LATERAL: 5.98 cm/s
LVEEAVG: 16.89
LVOT SV: 87 mL
LVOT VTI: 27.6 cm
LVOT area: 3.14 cm2
LVOT diameter: 20 mm
LVOT peak grad rest: 5 mmHg
LVOT peak vel: 117 cm/s
Lateral S' vel: 10.7 cm/s
MV Dec: 324
MV Peak grad: 4 mmHg
MVPKAVEL: 109 m/s
MVPKEVEL: 101 m/s
PV Reg grad dias: 8 mmHg
PV Reg vel dias: 140 cm/s
PW: 9 mm — AB (ref 0.6–1.1)
RV TAPSE: 18.2 mm
TDI e' lateral: 5.98
TR max vel: 223 cm/s
Weight: 2962.98 oz

## 2017-07-20 LAB — LIPID PANEL
CHOLESTEROL: 126 mg/dL (ref 0–200)
HDL: 32 mg/dL — ABNORMAL LOW (ref >40)
LDL Cholesterol: 71 mg/dL (ref 0–99)
Total CHOL/HDL Ratio: 3.9 RATIO
Triglycerides: 117 mg/dL (ref <150)
VLDL: 23 mg/dL (ref 0–40)

## 2017-07-20 LAB — CBC
HCT: 41.6 % (ref 39.0–52.0)
Hemoglobin: 13.5 g/dL (ref 13.0–17.0)
MCH: 29.3 pg (ref 26.0–34.0)
MCHC: 32.5 g/dL (ref 30.0–36.0)
MCV: 90.2 fL (ref 78.0–100.0)
Platelets: 182 10*3/uL (ref 150–400)
RBC: 4.61 MIL/uL (ref 4.22–5.81)
RDW: 13.8 % (ref 11.5–15.5)
WBC: 5.4 10*3/uL (ref 4.0–10.5)

## 2017-07-20 LAB — POCT ACTIVATED CLOTTING TIME: ACTIVATED CLOTTING TIME: 560 s

## 2017-07-20 LAB — TROPONIN I
TROPONIN I: 0.13 ng/mL — AB (ref <0.03)
Troponin I: 0.18 ng/mL (ref <0.03)

## 2017-07-20 LAB — HEMOGLOBIN A1C
Hgb A1c MFr Bld: 5.8 % — ABNORMAL HIGH (ref 4.8–5.6)
MEAN PLASMA GLUCOSE: 119.76 mg/dL

## 2017-07-20 MED ORDER — ASPIRIN 81 MG PO TBEC: 81.0000 mg | DELAYED_RELEASE_TABLET | Freq: Every day | ORAL | Status: DC

## 2017-07-20 MED ORDER — POTASSIUM CHLORIDE CRYS ER 20 MEQ PO TBCR
40.0000 meq | EXTENDED_RELEASE_TABLET | Freq: Once | ORAL | Status: AC
  Administered 2017-07-20: 10:00:00 40 meq via ORAL
  Filled 2017-07-20: qty 2

## 2017-07-20 MED ORDER — NITROGLYCERIN 0.4 MG SL SUBL: 0.4000 mg | SUBLINGUAL_TABLET | SUBLINGUAL | 2 refills | Status: DC | PRN

## 2017-07-20 MED ORDER — TICAGRELOR 90 MG PO TABS: 90.0000 mg | ORAL_TABLET | Freq: Two times a day (BID) | ORAL | 2 refills | Status: DC

## 2017-07-20 MED ORDER — ATORVASTATIN CALCIUM 80 MG PO TABS: 80.0000 mg | ORAL_TABLET | Freq: Every day | ORAL | 0 refills | Status: DC

## 2017-07-20 MED ORDER — METOPROLOL TARTRATE 25 MG PO TABS: 12.5000 mg | ORAL_TABLET | Freq: Two times a day (BID) | ORAL | 1 refills | Status: DC

## 2017-07-20 MED FILL — Heparin Sodium (Porcine) 2 Unit/ML in Sodium Chloride 0.9%: INTRAMUSCULAR | Qty: 1000 | Status: AC

## 2017-07-20 MED FILL — Lidocaine HCl Local Inj 1%: INTRAMUSCULAR | Qty: 20 | Status: AC

## 2017-07-20 NOTE — Progress Notes (Signed)
  Echocardiogram 2D Echocardiogram has been performed.  Paul Snyder 07/20/2017, 8:49 AM

## 2017-07-20 NOTE — Discharge Summary (Addendum)
Discharge Summary    Patient ID: JAYVION STEFANSKI,  MRN: [id number], DOB/AGE: 1936/01/11 82 y.o.  Admit date: 07/19/2017 Discharge date: 07/20/2017  Primary Care Provider: [name] Primary Cardiologist: Reola Calkins [name])  Discharge Diagnoses    Principal Problem:   NSTEMI (non-ST elevated myocardial infarction) [facility]) Active Problems:   Hyperlipidemia   Hypertension   Allergies Not on File  Diagnostic Studies/Procedures    Cath: 07/19/17  Conclusion     Prox Cx lesion is 30% stenosed.  Prox RCA to Mid RCA lesion is 55% stenosed.  Mid RCA lesion is 50% stenosed.  Prox LAD to Mid LAD lesion is 20% stenosed.  A stent was successfully placed.  Post intervention, there is a 0% residual stenosis.  Mid Cx lesion is 99% stenosed.   Multi vessel CAD with proximal irregularity of the LAD, 20% narrowing of the LAD in an intramyocardial segment beyond the diagonal vessel; mildly calcified proximal circumflex with 20-30% narrowing followed by a 99% stenosis after a bend in the mid vessel; 50-60% mid, and 50% stenosis at the acute margin of the RCA.  LVEDP 14 mmHg.  Successful percutaneous coronary intervention to the left circumflex vessel with ultimate insertion of a 3.016 mm Synergy DES stent postdilated 3.21 mm with a 99% stenosis being reduced to 0%.  RECOMMENDATION: DAPT for a minimum of 1 year.  Medical therapy for concomitant CAD.    _____________   History of Present Illness     82 yo male with PMH of HL, CHF, remote Lung CA with left sided thoracotomy and tobacco use. Reports he was seen by a cardiologist back in the 90s and told he had CHF. No cardiac issues since that time. Followed by his PCP. Currently lives at home. Works around this home, mows grass, and works in his garden. Does not normally have any anginal symptoms.   Saturday night he was awoken with left sided chest pain that lasted a couple of minutes, subsided and he was able to fall  back asleep. Sunday afternoon took a nap around noon and woke up with chest tightness again, but subsided quickly. Then Sunday night had terrible chest tightness, felt like he may die. Prayed and the pain went away in several minutes. Told his children about his symptoms and they brought him to the ED today for evaluation. No further chest pain since Sunday night. Does report family hx of CAD with brother having stents in his early 72s.   In the ED his labs showed stable electrolytes, Cr 1.05, POC trop 0.10, Hgb 15.0. CXR without acute edema. EKG showed SB with RBBB and 1st degree AVB. No chest pain at the time of exam.   Hospital Course     Given symptoms and clinical findings he was set for cardiac cath noted above with PCI/DES to mLCx, with residual disease 50% mRCA and 20% mLAD. Plan for DAPT with ASA/Brilinta for at least one year. Medical therapy for residual disease. His statin was switched to high dose Lipitor 80mg  daily. LDL 71. Also added metoprolol titrated to 12.5mg  BID. No recurrent chest pain. Worked well with cardiac rehab. Follow up echo was completed but official read was pending at the time of discharge.   Ayson Cherubini Arntson was seen by Dr. Irish Lack and determined stable for discharge home. Follow up in the office has been arranged. Medications are listed below.   _____________  Discharge Vitals Blood pressure 140/65, pulse (!) 56, temperature 98.1 F (36.7 C), temperature source  Oral, resp. rate 18, height 6' (1.829 m), weight 185 lb 3 oz (84 kg), SpO2 96 %.  Filed Weights   07/19/17 1141 07/20/17 0653  Weight: 185 lb (83.9 kg) 185 lb 3 oz (84 kg)    Labs & Radiologic Studies    CBC Recent Labs    07/19/17 1142 07/20/17 0012  WBC 6.8 5.4  HGB 15.0 13.5  HCT 46.4 41.6  MCV 91.0 90.2  PLT 208 767   Basic Metabolic Panel Recent Labs    07/19/17 1142 07/20/17 0012  NA 139 138  K 3.6 3.2*  CL 101 104  CO2 26 25  GLUCOSE 105* 122*  BUN 12 10  CREATININE 1.05 0.84   CALCIUM 9.5 9.0   Liver Function Tests No results for input(s): AST, ALT, ALKPHOS, BILITOT, PROT, ALBUMIN in the last 72 hours. No results for input(s): LIPASE, AMYLASE in the last 72 hours. Cardiac Enzymes Recent Labs    07/19/17 1821 07/20/17 0012 07/20/17 0714  TROPONINI 0.14* 0.18* 0.13*   BNP Invalid input(s): POCBNP D-Dimer No results for input(s): DDIMER in the last 72 hours. Hemoglobin A1C Recent Labs    07/20/17 0012  HGBA1C 5.8*   Fasting Lipid Panel Recent Labs    07/20/17 0012  CHOL 126  HDL 32*  LDLCALC 71  TRIG 117  CHOLHDL 3.9   Thyroid Function Tests No results for input(s): TSH, T4TOTAL, T3FREE, THYROIDAB in the last 72 hours.  Invalid input(s): FREET3 _____________  Dg Chest 2 View  Result Date: 07/19/2017 CLINICAL DATA:  Chest pain and productive cough for 2-3 weeks. EXAM: CHEST  2 VIEW COMPARISON:  Chest CT 01/13/2011 FINDINGS: The cardiac silhouette, mediastinal and hilar contours are within normal limits and stable. There is mild tortuosity and calcification of the thoracic aorta. Emphysematous changes are noted with hyperinflation and attenuation of the pulmonary vasculature. Biapical pleural and parenchymal scarring changes. Evidence of prior left-sided thoracotomy and radiation changes in the left upper lobe. When compared to the prior scout film from the CT scan of 2012 there appears to be slightly more rounded fullness in the area of radiation change. Could not exclude the possibility of recurrent tumor. Chest CT with contrast may be helpful for further evaluation. No infiltrates or effusions. The bony thorax is intact. Stable appearing lower thoracic compression fractures are noted. IMPRESSION: No active cardiopulmonary disease. Electronically Signed   By: Marijo Sanes M.D.   On: 07/19/2017 12:17   Disposition   Pt is being discharged home today in good condition.  Follow-up Plans & Appointments    Follow-up Information    Erma Heritage, PA-C Follow up on 08/10/2017.   Specialties:  Physician Assistant, Cardiology Why:  at 1:30pm for your follow up appt.  Contact information: [place] [id number] [phone]          Discharge Instructions    Amb Referral to Cardiac Rehabilitation   Complete by:  As directed    Diagnosis:   NSTEMI Coronary Stents PTCA     Call MD for:  redness, tenderness, or signs of infection (pain, swelling, redness, odor or green/yellow discharge around incision site)   Complete by:  As directed    Diet - low sodium heart healthy   Complete by:  As directed    Discharge instructions   Complete by:  As directed    Radial Site Care Refer to this sheet in the next few weeks. These instructions provide you with information on  caring for yourself after your procedure. Your caregiver may also give you more specific instructions. Your treatment has been planned according to current medical practices, but problems sometimes occur. Call your caregiver if you have any problems or questions after your procedure. HOME CARE INSTRUCTIONS You may shower the day after the procedure.Remove the bandage (dressing) and gently wash the site with plain soap and water.Gently pat the site dry.  Do not apply powder or lotion to the site.  Do not submerge the affected site in water for 3 to 5 days.  Inspect the site at least twice daily.  Do not flex or bend the affected arm for 24 hours.  No lifting over 5 pounds (2.3 kg) for 5 days after your procedure.  Do not drive home if you are discharged the same day of the procedure. Have someone else drive you.  You may drive 24 hours after the procedure unless otherwise instructed by your caregiver.  What to expect: Any bruising will usually fade within 1 to 2 weeks.  Blood that collects in the tissue (hematoma) may be painful to the touch. It should usually decrease in size and tenderness within 1 to 2 weeks.  SEEK IMMEDIATE MEDICAL CARE  IF: You have unusual pain at the radial site.  You have redness, warmth, swelling, or pain at the radial site.  You have drainage (other than a small amount of blood on the dressing).  You have chills.  You have a fever or persistent symptoms for more than 72 hours.  You have a fever and your symptoms suddenly get worse.  Your arm becomes pale, cool, tingly, or numb.  You have heavy bleeding from the site. Hold pressure on the site.   PLEASE DO NOT MISS ANY DOSES OF YOUR BRILINTA!!!!! Also keep a log of you blood pressures and bring back to your follow up appt. Please call the office with any questions.   Patients taking blood thinners should generally stay away from medicines like ibuprofen, Advil, Motrin, naproxen, and Aleve due to risk of stomach bleeding. You may take Tylenol as directed or talk to your primary doctor about alternatives.   Increase activity slowly   Complete by:  As directed       Discharge Medications     Medication List    STOP taking these medications   simvastatin 40 MG tablet Commonly known as:  ZOCOR     TAKE these medications   albuterol 108 (90 Base) MCG/ACT inhaler Commonly known as:  PROVENTIL HFA;VENTOLIN HFA Inhale 2 puffs into the lungs 4 (four) times daily.   aspirin 81 MG EC tablet Take 1 tablet (81 mg total) by mouth daily. What changed:    medication strength  how much to take   atorvastatin 80 MG tablet Commonly known as:  LIPITOR Take 1 tablet (80 mg total) by mouth daily at 6 PM.   furosemide 20 MG tablet Commonly known as:  LASIX Take 20 mg by mouth daily.   meclizine 25 MG tablet Commonly known as:  ANTIVERT Take 25 mg by mouth daily as needed for dizziness.   metoprolol tartrate 25 MG tablet Commonly known as:  LOPRESSOR Take 0.5 tablets (12.5 mg total) by mouth 2 (two) times daily.   niacin 500 MG tablet Take 500 mg by mouth daily.   nitroGLYCERIN 0.4 MG SL tablet Commonly known as:  NITROSTAT Place 1 tablet  (0.4 mg total) under the tongue every 5 (five) minutes x 3 doses as needed  for chest pain.   pantoprazole 40 MG tablet Commonly known as:  PROTONIX Take 40 mg by mouth daily.   ticagrelor 90 MG Tabs tablet Commonly known as:  BRILINTA Take 1 tablet (90 mg total) by mouth 2 (two) times daily.        Aspirin prescribed at discharge?  Yes High Intensity Statin Prescribed? (Lipitor 40-80mg  or Crestor 20-40mg ): Yes Beta Blocker Prescribed? Yes For EF <40%, was ACEI/ARB Prescribed? No ADP Receptor Inhibitor Prescribed? (i.e. Plavix etc.-Includes Medically Managed Patients): Yes For EF <40%, Aldosterone Inhibitor Prescribed? No Was EF assessed during THIS hospitalization? Yes, echo pending.  Was Cardiac Rehab II ordered? (Included Medically managed Patients): Yes   Outstanding Labs/Studies   FLP/LFTs in 6 weeks if tolerating statin.   Duration of Discharge Encounter   Greater than 30 minutes including physician time.  Signed, Reino Bellis NP-C 07/20/2017, 10:50 AM   I have examined the patient and reviewed assessment and plan and discussed with patient.  Agree with above as stated.   GEN: Well nourished, well developed, in no acute distress  HEENT: normal  Neck: no JVD, carotid bruits, or masses Cardiac: RRR; no murmurs, rubs, or gallops,no edema  Respiratory:  clear to auscultation bilaterally, normal work of breathing GI: soft, nontender, nondistended,  MS: no deformity or atrophy ; right wrist is stable Skin: warm and dry, no rash Neuro:  Strength and sensation are intact Psych: euthymic mood, full affect  Doing well post cath.  OK to discharge.  I stressed the importance of DAPT.  Aggressive secondary prevention should be followed with LDL target of 70.   Larae Grooms

## 2017-07-20 NOTE — Progress Notes (Signed)
TR BAND REMOVAL  LOCATION:    right radial  DEFLATED PER PROTOCOL:    Yes.    TIME BAND OFF / DRESSING APPLIED:    01:30   SITE UPON ARRIVAL:    Level 0  SITE AFTER BAND REMOVAL:    Level 0  CIRCULATION SENSATION AND MOVEMENT:    Within Normal Limits   Yes.    COMMENTS:   Post TR band instructions given. Pt tolerated well.

## 2017-07-20 NOTE — Progress Notes (Addendum)
CARDIAC REHAB PHASE I   PRE:  Rate/Rhythm: 29 SR  BP:  Sitting: 124/66        MODE:  Ambulation: 450 ft   POST:  Rate/Rhythm: 77 SR  BP:  Sitting: 107/97      0900-1005 Pt ambulated with cardiac rehab with standby assist. Gait unsteady at first but with some improvements at the end of the walk.  Pt has a cane at home but does not use it.  Encourage patient to use.  Education provided to patient and family including information about stent, Brilinta use, Nitroglycerin, Exercise guidelines, and restrictions.  Case manager to see about Brilinta.  Referral to [facility] made.  Would prefer follow up in [facility] for cardiology.  Noel Christmas, RN 07/20/2017 10:12 AM

## 2017-07-20 NOTE — Care Management Obs Status (Signed)
[place] NOTIFICATION   Patient Details  Name: Paul Snyder MRN: [id number] Date of Birth: 1935-09-13   Medicare Observation Status Notification Given:       Zenon Mayo, RN 07/20/2017, 10:08 AM

## 2017-07-20 NOTE — Care Management Obs Status (Deleted)
[place] NOTIFICATION   Patient Details  Name: Paul Snyder MRN: [id number] Date of Birth: 11/12/1935   Medicare Observation Status Notification Given:  Yes    Zenon Mayo, RN 07/20/2017, 10:08 AM

## 2017-07-20 NOTE — Care Management Note (Signed)
Case Management Note  Patient Details  Name: Paul Snyder MRN: [id number] Date of Birth: Oct 07, 1935  Subjective/Objective:  From home, pta indep, s/p stent intervention, will be on brilinta.  NCM gave patient the 30 day savings coupon, he will be going to the [facility] in [place] and they do have in stock.  NCM informed Girtha Rm, RN Nere and Patient and daughter of co pay of 141.00 for refills.  Per PA they will let him take the 30 day free and then will switch to a different medication on hospital follow up with cardiologist.                   Action/Plan: DC home today with daughter transporting.    Expected Discharge Date:  07/20/17               Expected Discharge Plan:  (P) Home/Self Care  In-House Referral:     Discharge planning Services  (P) CM Consult  Post Acute Care Choice:    Choice offered to:     DME Arranged:    DME Agency:     HH Arranged:    HH Agency:     Status of Service:  (P) Completed, signed off  If discussed at [place] of Stay Meetings, dates discussed:    Additional Comments:  Zenon Mayo, RN 07/20/2017, 11:08 AM

## 2017-07-20 NOTE — Progress Notes (Signed)
#   2.  S/W DEBRA @ [employer] # [phone] OPT- 2    BRILINTA 90 MG BID  COVER- YES  CO-PAY- $ 141.00  TIER- 3 DRUG  PRIOR APPROVAL- NO  NO DEDUCTIBLE   PREFERRED PHARMACY : [facility]

## 2017-08-09 NOTE — Progress Notes (Signed)
Cardiology Office Note    Date:  08/10/2017   ID:  Paul Snyder, DOB 1936-02-05, MRN [id number]  PCP:  Paul Housekeeper, MD  Cardiologist: Paul Snyder --> To follow-up with Dr. [name]  Chief Complaint  Patient presents with  . Hospitalization Follow-up    s/p NSTEMI    History of Present Illness:    Paul Snyder is a 82 y.o. male with past medical history of HTN, HLD, GERD, and remote lung cancer (s/p left thoracotomy) who presents to the office today for hospital follow-up.  He was recently admitted to [facility] on 07/19/2017 for evaluation of chest pain. Reported having episodes of chest pain which awoke him from sleep with associated dyspnea. Troponin values were found to be elevated to 0.18 and he was admitted for further management and started on IV Heparin. He underwent a cardiac catheterization later that day which showed multivessel CAD with 20% stenosis of proximal LAD, 55-60% stenosis along the proximal to mid RCA, 30% proximal LCx stenosis, and 99% mid LCx stenosis.  Successful PCI was performed with insertion of a synergy DES to the mid LCx. He was started on DAPT with ASA and Brilinta along with BB and statin therapy.   In talking with the patient today, he reports overall doing well since his recent hospitalization. He denies any repeat episodes of chest pain or dyspnea on exertion. No recent orthopnea, PND, or lower extremity edema.  He has been increasing his activity daily and is walking over 400 feet to his mailbox.  He reports good compliance with his medication regimen including ASA and Brilinta. Mentions his Brilinta co-pay is going to be over $140 per month and he is unable to afford this. Asks to be switched to a different medication.   Past Medical History:  Diagnosis Date  . CHF (congestive heart failure) ([place])   . COPD (chronic obstructive pulmonary disease) ([place])   . GERD (gastroesophageal reflux disease)   . Hyperlipidemia   . Lung cancer ([place])  2009  . NSTEMI (non-ST elevated myocardial infarction) ([place]) 07/16/2017   2/19 PCI/DES x1 to Lcx with moderate (50%) in the RCA, mild (20%) in LAD    Past Surgical History:  Procedure Laterality Date  . ANKLE FRACTURE SURGERY Right 1970s  . CATARACT EXTRACTION Left   . CORONARY ANGIOPLASTY WITH STENT PLACEMENT  07/19/2017  . CORONARY STENT INTERVENTION N/A 07/19/2017   Procedure: CORONARY STENT INTERVENTION;  Surgeon: [name], MD;  Location: [facility];  Service: Cardiovascular;  Laterality: N/A;  . FRACTURE SURGERY    . LEFT HEART CATH AND CORONARY ANGIOGRAPHY N/A 07/19/2017   Procedure: LEFT HEART CATH AND CORONARY ANGIOGRAPHY;  Surgeon: [name], MD;  Location: [facility];  Service: Cardiovascular;  Laterality: N/A;  . LUNG BIOPSY      Current Medications: Outpatient Medications Prior to Visit  Medication Sig Dispense Refill  . albuterol (PROVENTIL HFA;VENTOLIN HFA) 108 (90 Base) MCG/ACT inhaler Inhale 2 puffs into the lungs 4 (four) times daily.     Marland Kitchen aspirin EC 81 MG EC tablet Take 1 tablet (81 mg total) by mouth daily.    . furosemide (LASIX) 20 MG tablet Take 20 mg by mouth daily.    . meclizine (ANTIVERT) 25 MG tablet Take 25 mg by mouth daily as needed for dizziness.    . niacin 500 MG tablet Take 500 mg by mouth daily.     . nitroGLYCERIN (NITROSTAT) 0.4 MG SL  tablet Place 1 tablet (0.4 mg total) under the tongue every 5 (five) minutes x 3 doses as needed for chest pain. 25 tablet 2  . pantoprazole (PROTONIX) 40 MG tablet Take 40 mg by mouth daily.    . ticagrelor (BRILINTA) 90 MG TABS tablet Take 1 tablet (90 mg total) by mouth 2 (two) times daily. 180 tablet 2  . atorvastatin (LIPITOR) 80 MG tablet Take 1 tablet (80 mg total) by mouth daily at 6 PM. 90 tablet 0  . metoprolol tartrate (LOPRESSOR) 25 MG tablet Take 0.5 tablets (12.5 mg total) by mouth 2 (two) times daily. 60 tablet 1   No facility-administered medications prior to visit.       Allergies:   Patient has no allergy information on record.   Social History   Socioeconomic History  . Marital status: Widowed    Spouse name: Not on file  . Number of children: Not on file  . Years of education: Not on file  . Highest education level: Not on file  Occupational History  . Not on file  Social Needs  . Financial resource strain: Not on file  . Food insecurity:    Worry: Not on file    Inability: Not on file  . Transportation needs:    Medical: Not on file    Non-medical: Not on file  Tobacco Use  . Smoking status: Former Smoker    Packs/day: 1.00    Years: 42.00    Pack years: 42.00    Types: Cigarettes    Last attempt to quit: 1996    Years since quitting: 23.2  . Smokeless tobacco: Never Used  Substance and Sexual Activity  . Alcohol use: No    Frequency: Never  . Drug use: No  . Sexual activity: Never  Lifestyle  . Physical activity:    Days per week: Not on file    Minutes per session: Not on file  . Stress: Not on file  Relationships  . Social connections:    Talks on phone: Not on file    Gets together: Not on file    Attends religious service: Not on file    Active member of club or organization: Not on file    Attends meetings of clubs or organizations: Not on file    Relationship status: Not on file  Other Topics Concern  . Not on file  Social History Narrative  . Not on file     Family History:  The patient's family history includes CAD in his brother.   Review of Systems:   Please see the history of present illness.     General:  No chills, fever, night sweats or weight changes.  Cardiovascular:  No chest pain, edema, orthopnea, palpitations, paroxysmal nocturnal dyspnea. Positive for dyspnea on exertion (improving).  Dermatological: No rash, lesions/masses Respiratory: No cough, dyspnea Urologic: No hematuria, dysuria Abdominal:   No nausea, vomiting, diarrhea, bright red blood per rectum, melena, or  hematemesis Neurologic:  No visual changes, wkns, changes in mental status. All other systems reviewed and are otherwise negative except as noted above.   Physical Exam:    VS:  BP (!) 146/76 (BP Location: Left Arm)   Pulse (!) 56   Ht 6' (1.829 m)   Wt 182 lb (82.6 kg)   SpO2 95%   BMI 24.68 kg/m    General: Well developed, well nourished Caucasian male appearing in no acute distress. Head: Normocephalic, atraumatic, sclera non-icteric, no xanthomas, nares are  without discharge.  Neck: No carotid bruits. JVD not elevated.  Lungs: Respirations regular and unlabored, without wheezes or rales.  Heart: Regular rate and rhythm. No S3 or S4.  No murmur, no rubs, or gallops appreciated. Abdomen: Soft, non-tender, non-distended with normoactive bowel sounds. No hepatomegaly. No rebound/guarding. No obvious abdominal masses. Msk:  Strength and tone appear normal for age. No joint deformities or effusions. Extremities: No clubbing or cyanosis. No lower extremity edema.  Distal pedal pulses are 2+ bilaterally. Radial cath site stable with no ecchymosis or evidence of a hematoma.  Neuro: Alert and oriented X 3. Moves all extremities spontaneously. No focal deficits noted. Psych:  Responds to questions appropriately with a normal affect. Skin: No rashes or lesions noted  Wt Readings from Last 3 Encounters:  08/10/17 182 lb (82.6 kg)  07/20/17 185 lb 3 oz (84 kg)     Studies/Labs Reviewed:   EKG:  EKG is ordered today.  The ekg ordered today demonstrates sinus bradycardia, HR 54, with known RBBB and LAFB.   Recent Labs: 07/20/2017: BUN 10; Creatinine, Ser 0.84; Hemoglobin 13.5; Platelets 182; Potassium 3.2; Sodium 138   Lipid Panel    Component Value Date/Time   CHOL 126 07/20/2017 0012   TRIG 117 07/20/2017 0012   HDL 32 (L) 07/20/2017 0012   CHOLHDL 3.9 07/20/2017 0012   VLDL 23 07/20/2017 0012   LDLCALC 71 07/20/2017 0012    Additional studies/ records that were reviewed today  include:   Cardiac Catheterization: 07/19/2017  Prox Cx lesion is 30% stenosed.  Prox RCA to Mid RCA lesion is 55% stenosed.  Mid RCA lesion is 50% stenosed.  Prox LAD to Mid LAD lesion is 20% stenosed.  A stent was successfully placed.  Post intervention, there is a 0% residual stenosis.  Mid Cx lesion is 99% stenosed.   Multi vessel CAD with proximal irregularity of the LAD, 20% narrowing of the LAD in an intramyocardial segment beyond the diagonal vessel; mildly calcified proximal circumflex with 20-30% narrowing followed by a 99% stenosis after a bend in the mid vessel; 50-60% mid, and 50% stenosis at the acute margin of the RCA.  LVEDP 14 mmHg.  Successful percutaneous coronary intervention to the left circumflex vessel with ultimate insertion of a 3.016 mm Synergy DES stent postdilated 3.21 mm with a 99% stenosis being reduced to 0%.  RECOMMENDATION: DAPT for a minimum of 1 year.  Medical therapy for concomitant CAD.   Echocardiogram: 07/20/2017 Study Conclusions - Left ventricle: The cavity size was normal. Systolic function was   normal. The estimated ejection fraction was in the range of 55%   to 60%. Mild hypokinesis of the basal-midinferolateral   myocardium; consistent with ischemia in the distribution of the   right coronary or left circumflex coronary artery. Features are   consistent with a pseudonormal left ventricular filling pattern,   with concomitant abnormal relaxation and increased filling   pressure (grade 2 diastolic dysfunction). - Mitral valve: Calcified annulus.  Assessment:    1. Coronary artery disease involving native coronary artery of native heart without angina pectoris   2. Non-ST elevation myocardial infarction (NSTEMI), subsequent episode of care ([place])   3. Essential hypertension   4. Hyperlipidemia LDL goal <70      Plan:   In order of problems listed above:  1. CAD/ Subsequent Episode of Care for NSTEMI - recently admitted  for evaluation of chest pain and was found to have an NSTEMI. Cath showed multivessel CAD with  20% stenosis of proximal LAD, 55-60% stenosis along the proximal to mid RCA, 30% proximal LCx stenosis, and 99% mid LCx stenosis. Successful PCI was performed with insertion of a synergy DES to the mid LCx. Echocardiogram showed a preserved EF of 55 to 60% as outlined above.  - He reports overall doing well since hospital discharge and denies any recent chest pain.  His dyspnea continues to improve and he is increasing his ambulation.  - will continue on DAPT for at least one year. He is unable to afford Brilinta as his co-pay is going to be over $140 per month. Will finish out 30-day supply and provided with samples for an additional two weeks. Will then switch to Plavix 75mg  daily and continue on ASA 81mg  daily. Continue BB and statin therapy. Has been referred to cardiac rehab.   2. HTN - BP is slightly elevated at 146/76 during today's visit. Reports this has been well controlled when checked at home. -We will continue Lopressor 12.5 mg twice daily and Lasix 20 mg daily. Would not further titrate Lopressor as heart rate is in the 50's during today's visit.  3. HLD - FLP during recent admission showed total cholesterol 126, triglycerides 117, HDL 32, and LDL 71. He was switched from Simvastatin to Atorvastatin 80 mg daily at that time.  Will need repeat FLP and LFT's in 6-8 weeks.   4. COPD - Denies any recent changes in his respiratory status. Followed by PCP.   Medication Adjustments/Labs and Tests Ordered: Current medicines are reviewed at length with the patient today.  Concerns regarding medicines are outlined above.  Medication changes, Labs and Tests ordered today are listed in the Patient Instructions below. Patient Instructions  Medication Instructions:  START PLAVIX 75 MG DAILY (AFTER YOU FINISH THE SAMPLES OF BRILINTA)  Labwork: NONE  Testing/Procedures: NONE  Follow-Up: Your  physician recommends that you schedule a follow-up appointment in: 3 MONTHS   Any Other Special Instructions Will Be Listed Below (If Applicable).  If you need a refill on your cardiac medications before your next appointment, please call your pharmacy.    Signed, Erma Heritage, PA-C  08/10/2017 4:30 PM    [place] [address] [place], [place] [address] Phone: [phone]

## 2017-08-10 ENCOUNTER — Encounter: Payer: Self-pay | Admitting: Student

## 2017-08-10 ENCOUNTER — Ambulatory Visit (INDEPENDENT_AMBULATORY_CARE_PROVIDER_SITE_OTHER): Payer: Medicare Other | Admitting: Student

## 2017-08-10 VITALS — BP 146/76 | HR 56 | Ht 72.0 in | Wt 182.0 lb

## 2017-08-10 DIAGNOSIS — E785 Hyperlipidemia, unspecified: Secondary | ICD-10-CM

## 2017-08-10 DIAGNOSIS — I10 Essential (primary) hypertension: Secondary | ICD-10-CM | POA: Diagnosis not present

## 2017-08-10 DIAGNOSIS — I2510 Atherosclerotic heart disease of native coronary artery without angina pectoris: Secondary | ICD-10-CM | POA: Diagnosis not present

## 2017-08-10 DIAGNOSIS — I214 Non-ST elevation (NSTEMI) myocardial infarction: Secondary | ICD-10-CM | POA: Diagnosis not present

## 2017-08-10 MED ORDER — CLOPIDOGREL BISULFATE 75 MG PO TABS: 75.0000 mg | ORAL_TABLET | Freq: Every day | ORAL | 3 refills | Status: DC

## 2017-08-10 MED ORDER — ATORVASTATIN CALCIUM 80 MG PO TABS: 80.0000 mg | ORAL_TABLET | Freq: Every day | ORAL | 3 refills | Status: DC

## 2017-08-10 MED ORDER — METOPROLOL TARTRATE 25 MG PO TABS: 12.5000 mg | ORAL_TABLET | Freq: Two times a day (BID) | ORAL | 3 refills | Status: DC

## 2017-08-10 NOTE — Patient Instructions (Addendum)
Medication Instructions:  START PLAVIX 75 MG DAILY (AFTER YOU FINISH THE SAMPLES OF BRILINTA)  Labwork: NONE  Testing/Procedures: NONE  Follow-Up: Your physician recommends that you schedule a follow-up appointment in: 3 MONTHS     Any Other Special Instructions Will Be Listed Below (If Applicable).     If you need a refill on your cardiac medications before your next appointment, please call your pharmacy.

## 2017-08-24 ENCOUNTER — Encounter (HOSPITAL_COMMUNITY)
Admission: RE | Admit: 2017-08-24 | Discharge: 2017-08-24 | Disposition: A | Discharge status: Home / Self Care | Payer: Medicare Other | Source: Ambulatory Visit | Attending: Cardiology | Admitting: Cardiology

## 2017-08-24 VITALS — BP 100/50 | HR 51 | Ht 72.0 in | Wt 178.2 lb

## 2017-08-24 DIAGNOSIS — Z955 Presence of coronary angioplasty implant and graft: Secondary | ICD-10-CM | POA: Insufficient documentation

## 2017-08-24 DIAGNOSIS — I214 Non-ST elevation (NSTEMI) myocardial infarction: Secondary | ICD-10-CM | POA: Diagnosis present

## 2017-08-24 NOTE — Progress Notes (Signed)
Cardiac Individual Treatment Plan  Patient Details  Name: Paul Snyder MRN: [id number] Date of Birth: [birth year] Referring Provider:     CARDIAC REHAB PHASE II ORIENTATION from 08/24/2017 in [place]  Referring Provider  Dr. [name]      Initial Encounter Date:    CARDIAC REHAB PHASE II ORIENTATION from 08/24/2017 in [place]  Date  08/24/17  Referring Provider  Dr. [name]      Visit Diagnosis: NSTEMI (non-ST elevated myocardial infarction) [facility])  Status post coronary artery stent placement  Patient's Home Medications on Admission:  Current Outpatient Medications:  .  albuterol (PROVENTIL HFA;VENTOLIN HFA) 108 (90 Base) MCG/ACT inhaler, Inhale 2 puffs into the lungs 4 (four) times daily. , Disp: , Rfl:  .  aspirin EC 81 MG EC tablet, Take 1 tablet (81 mg total) by mouth daily., Disp: , Rfl:  .  atorvastatin (LIPITOR) 80 MG tablet, Take 1 tablet (80 mg total) by mouth daily at 6 PM., Disp: 90 tablet, Rfl: 3 .  furosemide (LASIX) 20 MG tablet, Take 20 mg by mouth daily., Disp: , Rfl:  .  metoprolol tartrate (LOPRESSOR) 25 MG tablet, Take 0.5 tablets (12.5 mg total) by mouth 2 (two) times daily., Disp: 180 tablet, Rfl: 3 .  niacin 500 MG tablet, Take 500 mg by mouth daily. , Disp: , Rfl:  .  nitroGLYCERIN (NITROSTAT) 0.4 MG SL tablet, Place 1 tablet (0.4 mg total) under the tongue every 5 (five) minutes x 3 doses as needed for chest pain., Disp: 25 tablet, Rfl: 2 .  pantoprazole (PROTONIX) 40 MG tablet, Take 40 mg by mouth daily., Disp: , Rfl:  .  ticagrelor (BRILINTA) 90 MG TABS tablet, Take 1 tablet (90 mg total) by mouth 2 (two) times daily., Disp: 180 tablet, Rfl: 2  Past Medical History: Past Medical History:  Diagnosis Date  . CHF (congestive heart failure) ([place])   . COPD (chronic obstructive pulmonary disease) ([place])   . GERD (gastroesophageal reflux disease)   . Hyperlipidemia   . Lung cancer ([place]) 2009  . NSTEMI (non-ST  elevated myocardial infarction) ([place]) 07/16/2017   2/19 PCI/DES x1 to Lcx with moderate (50%) in the RCA, mild (20%) in LAD    Tobacco Use: Social History   Tobacco Use  Smoking Status Former Smoker  . Packs/day: 1.00  . Years: 42.00  . Pack years: 42.00  . Types: Cigarettes  . Last attempt to quit: 1996  . Years since quitting: 23.2  Smokeless Tobacco Never Used    Labs: Recent Review Scientist, physiological    Labs for ITP Cardiac and Pulmonary Rehab Latest Ref Rng & Units 07/20/2017   Cholestrol 0 - 200 mg/dL 126   LDLCALC 0 - 99 mg/dL 71   HDL >40 mg/dL 32(L)   Trlycerides <150 mg/dL 117   Hemoglobin A1c 4.8 - 5.6 % 5.8(H)      Capillary Blood Glucose: Lab Results  Component Value Date   GLUCAP 94 01/21/2009     Exercise Target Goals: Date: 08/24/17  Exercise Program Goal: Individual exercise prescription set using results from initial 6 min walk test and THRR while considering  patient's activity barriers and safety.   Exercise Prescription Goal: Starting with aerobic activity 30 plus minutes a day, 3 days per week for initial exercise prescription. Provide home exercise prescription and guidelines that participant acknowledges understanding prior to discharge.  Activity Barriers & Risk Stratification: Activity Barriers & Cardiac Risk Stratification - 08/24/17 1002  Activity Barriers & Cardiac Risk Stratification   Activity Barriers  Shortness of Breath;Deconditioning    Cardiac Risk Stratification  High       6 Minute Walk: 6 Minute Walk    Row Name 08/24/17 1002         6 Minute Walk   Phase  Initial     Distance  850 feet     Distance % Change  0 %     Distance Feet Change  0 ft     Walk Time  6 minutes     # of Rest Breaks  0     MPH  1.6     METS  2.23     RPE  12     Perceived Dyspnea   11     VO2 Peak  6.16     Symptoms  No     Resting HR  51 bpm     Resting BP  100/50     Resting Oxygen Saturation   95 %     Exercise Oxygen Saturation   during 6 min walk  93 %     Max Ex. HR  89 bpm     Max Ex. BP  128/58     2 Minute Post BP  106/52        Oxygen Initial Assessment:   Oxygen Re-Evaluation:   Oxygen Discharge (Final Oxygen Re-Evaluation):   Initial Exercise Prescription: Initial Exercise Prescription - 08/24/17 1000      Date of Initial Exercise RX and Referring Provider   Date  08/24/17    Referring Provider  Dr. [name]      Treadmill   MPH  0.8    Grade  0    Minutes  15    METs  1.6      NuStep   Level  1    SPM  56    Minutes  20    METs  1.6      Prescription Details   Frequency (times per week)  3    Duration  Progress to 30 minutes of continuous aerobic without signs/symptoms of physical distress      Intensity   THRR 40-80% of Max Heartrate  [phone]    Ratings of Perceived Exertion  11-13    Perceived Dyspnea  0-4      Progression   Progression  Continue progressive overload as per policy without signs/symptoms or physical distress.      Resistance Training   Training Prescription  Yes    Weight  1    Reps  10-15       Perform Capillary Blood Glucose checks as needed.  Exercise Prescription Changes:   Exercise Comments:   Exercise Goals and Review:  Exercise Goals    Row Name 08/24/17 1004             Exercise Goals   Increase Physical Activity  Yes       Intervention  Develop an individualized exercise prescription for aerobic and resistive training based on initial evaluation findings, risk stratification, comorbidities and participant's personal goals.;Provide advice, education, support and counseling about physical activity/exercise needs.       Expected Outcomes  Short Term: Attend rehab on a regular basis to increase amount of physical activity.;Long Term: Exercising regularly at least 3-5 days a week.;Long Term: Add in home exercise to make exercise part of routine and to increase amount of physical activity.  Increase Strength and Stamina  Yes        Intervention  Develop an individualized exercise prescription for aerobic and resistive training based on initial evaluation findings, risk stratification, comorbidities and participant's personal goals.;Provide advice, education, support and counseling about physical activity/exercise needs.       Expected Outcomes  Short Term: Increase workloads from initial exercise prescription for resistance, speed, and METs.       Able to understand and use rate of perceived exertion (RPE) scale  Yes       Intervention  Provide education and explanation on how to use RPE scale       Expected Outcomes  Short Term: Able to use RPE daily in rehab to express subjective intensity level;Long Term:  Able to use RPE to guide intensity level when exercising independently       Able to understand and use Dyspnea scale  Yes       Intervention  Provide education and explanation on how to use Dyspnea scale       Expected Outcomes  Short Term: Able to use Dyspnea scale daily in rehab to express subjective sense of shortness of breath during exertion;Long Term: Able to use Dyspnea scale to guide intensity level when exercising independently       Knowledge and understanding of Target Heart Rate Range (THRR)  Yes       Intervention  Provide education and explanation of THRR including how the numbers were predicted and where they are located for reference       Expected Outcomes  Short Term: Able to state/look up THRR;Long Term: Able to use THRR to govern intensity when exercising independently;Short Term: Able to use daily as guideline for intensity in rehab       Able to check pulse independently  Yes       Intervention  Provide education and demonstration on how to check pulse in carotid and radial arteries.;Review the importance of being able to check your own pulse for safety during independent exercise       Expected Outcomes  Short Term: Able to explain why pulse checking is important during independent exercise;Long Term:  Able to check pulse independently and accurately       Understanding of Exercise Prescription  Yes       Intervention  Provide education, explanation, and written materials on patient's individual exercise prescription       Expected Outcomes  Short Term: Able to explain program exercise prescription;Long Term: Able to explain home exercise prescription to exercise independently          Exercise Goals Re-Evaluation :    Discharge Exercise Prescription (Final Exercise Prescription Changes):   Nutrition:  Target Goals: Understanding of nutrition guidelines, daily intake of sodium 1500mg , cholesterol 200mg , calories 30% from fat and 7% or less from saturated fats, daily to have 5 or more servings of fruits and vegetables.  Biometrics: Pre Biometrics - 08/24/17 1004      Pre Biometrics   Height  6' (1.829 m)    Weight  178 lb 3.2 oz (80.8 kg)    Waist Circumference  41.5 inches    Hip Circumference  39 inches    Waist to Hip Ratio  1.06 %    BMI (Calculated)  24.16    Triceps Skinfold  8 mm    % Body Fat  24.6 %    Grip Strength  61.26 kg    Flexibility  0 in    Single  Leg Stand  1 seconds        Nutrition Therapy Plan and Nutrition Goals: Nutrition Therapy & Goals - 08/24/17 1018      Personal Nutrition Goals   Personal Goal #2  Patient's family (son and daughter) are helping him with a heart healthy diet.     Additional Goals?  No       Nutrition Assessments:   Nutrition Goals Re-Evaluation:   Nutrition Goals Discharge (Final Nutrition Goals Re-Evaluation):   Psychosocial: Target Goals: Acknowledge presence or absence of significant depression and/or stress, maximize coping skills, provide positive support system. Participant is able to verbalize types and ability to use techniques and skills needed for reducing stress and depression.  Initial Review & Psychosocial Screening: Initial Psych Review & Screening - 08/24/17 1020      Initial Review   Current  issues with  None Identified      Family Dynamics   Good Support System?  Yes      Barriers   Psychosocial barriers to participate in program  There are no identifiable barriers or psychosocial needs.      Screening Interventions   Interventions  Encouraged to exercise    Expected Outcomes  Short Term goal: Identification and review with participant of any Quality of Life or Depression concerns found by scoring the questionnaire.;Long Term goal: The participant improves quality of Life and PHQ9 Scores as seen by post scores and/or verbalization of changes       Quality of Life Scores: Quality of Life - 08/24/17 1005      Quality of Life Scores   Health/Function Pre  22.83 %    Socioeconomic Pre  23.88 %    Psych/Spiritual Pre  29.14 %    Family Pre  28.8 %    GLOBAL Pre  25.19 %      Scores of 19 and below usually indicate a poorer quality of life in these areas.  A difference of  2-3 points is a clinically meaningful difference.  A difference of 2-3 points in the total score of the Quality of Life Index has been associated with significant improvement in overall quality of life, self-image, physical symptoms, and general health in studies assessing change in quality of life.  PHQ-9: Recent Review Flowsheet Data    Depression screen [facility] 2/9 08/24/2017   Decreased Interest 0   Down, Depressed, Hopeless 0   PHQ - 2 Score 0   Altered sleeping 0   Tired, decreased energy 0   Change in appetite 0   Feeling bad or failure about yourself  0   Trouble concentrating 0   Moving slowly or fidgety/restless 0   Suicidal thoughts 0   PHQ-9 Score 0   Difficult doing work/chores Not difficult at all     Interpretation of Total Score  Total Score Depression Severity:  1-4 = Minimal depression, 5-9 = Mild depression, 10-14 = Moderate depression, 15-19 = Moderately severe depression, 20-27 = Severe depression   Psychosocial Evaluation and Intervention: Psychosocial Evaluation - 08/24/17  1021      Psychosocial Evaluation & Interventions   Interventions  Encouraged to exercise with the program and follow exercise prescription    Continue Psychosocial Services   No Follow up required       Psychosocial Re-Evaluation:   Psychosocial Discharge (Final Psychosocial Re-Evaluation):   Vocational Rehabilitation: Provide vocational rehab assistance to qualifying candidates.   Vocational Rehab Evaluation & Intervention: Vocational Rehab - 08/24/17 1014  Initial Vocational Rehab Evaluation & Intervention   Assessment shows need for Vocational Rehabilitation  No       Education: Education Goals: Education classes will be provided on a weekly basis, covering required topics. Participant will state understanding/return demonstration of topics presented.  Learning Barriers/Preferences: Learning Barriers/Preferences - 08/24/17 1012      Learning Barriers/Preferences   Learning Barriers  Hearing    Learning Preferences  Video;Written Material;Pictoral;Computer/Internet       Education Topics: Hypertension, Hypertension Reduction -Define heart disease and high blood pressure. Discus how high blood pressure affects the body and ways to reduce high blood pressure.   Exercise and Your Heart -Discuss why it is important to exercise, the FITT principles of exercise, normal and abnormal responses to exercise, and how to exercise safely.   Angina -Discuss definition of angina, causes of angina, treatment of angina, and how to decrease risk of having angina.   Cardiac Medications -Review what the following cardiac medications are used for, how they affect the body, and side effects that may occur when taking the medications.  Medications include Aspirin, Beta blockers, calcium channel blockers, ACE Inhibitors, angiotensin receptor blockers, diuretics, digoxin, and antihyperlipidemics.   Congestive Heart Failure -Discuss the definition of CHF, how to live with CHF, the  signs and symptoms of CHF, and how keep track of weight and sodium intake.   Heart Disease and Intimacy -Discus the effect sexual activity has on the heart, how changes occur during intimacy as we age, and safety during sexual activity.   Smoking Cessation / COPD -Discuss different methods to quit smoking, the health benefits of quitting smoking, and the definition of COPD.   Nutrition I: Fats -Discuss the types of cholesterol, what cholesterol does to the heart, and how cholesterol levels can be controlled.   Nutrition II: Labels -Discuss the different components of food labels and how to read food label   Heart Parts/Heart Disease and PAD -Discuss the anatomy of the heart, the pathway of blood circulation through the heart, and these are affected by heart disease.   Stress I: Signs and Symptoms -Discuss the causes of stress, how stress may lead to anxiety and depression, and ways to limit stress.   Stress II: Relaxation -Discuss different types of relaxation techniques to limit stress.   Warning Signs of Stroke / TIA -Discuss definition of a stroke, what the signs and symptoms are of a stroke, and how to identify when someone is having stroke.   Knowledge Questionnaire Score: Knowledge Questionnaire Score - 08/24/17 1014      Knowledge Questionnaire Score   Pre Score  20/24       Core Components/Risk Factors/Patient Goals at Admission: Personal Goals and Risk Factors at Admission - 08/24/17 1018      Core Components/Risk Factors/Patient Goals on Admission    Weight Management  Weight Maintenance    Personal Goal Other  Yes    Personal Goal  Become more heart healthy and breathe better.     Intervention  Attend CR 3 x week and supplement with home exercise 2 x week.    Expected Outcomes  Reach personal goals       Core Components/Risk Factors/Patient Goals Review:    Core Components/Risk Factors/Patient Goals at Discharge (Final Review):    ITP  Comments: ITP Comments    Row Name 08/24/17 1016 08/24/17 1046 08/24/17 1247       ITP Comments  Patient is been referred to Cardiac Rehab by Dr. Irish Lack due  to NSTEMI and Stent placement.   Patient is beening referred to Cardiac Rehab by Dr. Irish Lack due to NSTEMI and Stent placement.   Patient new to program. Plans to start Monday 08/28/17. Will continue to monitor for progress.         Comments: ITP 30 Day REVIEW Patient new to program. Plans to start Monday 08/28/17. Will continue to monitor for progress.

## 2017-08-24 NOTE — Progress Notes (Signed)
Cardiac/Pulmonary Rehab Medication Review by a Pharmacist  Does the patient  feel that his/her medications are working for him/her?  yes  Has the patient been experiencing any side effects to the medications prescribed?  no  Does the patient measure his/her own blood pressure or blood glucose at home?  no   Does the patient have any problems obtaining medications due to transportation or finances?   no  Understanding of regimen: good Understanding of indications: fair Potential of compliance: good  Questions asked to Determine Patient Understanding of Medication Regimen:  1. What is the name of the medication?  2. What is the medication used for?  3. When should it be taken?  4. How much should be taken?  5. How will you take it?  6. What side effects should you report?  Understanding Defined as: Excellent: All questions above are correct Good: Questions 1-4 are correct Fair: Questions 1-2 are correct  Poor: 1 or none of the above questions are correct   Pharmacist comments: Pt does not c/o any side effects from medications.  Med list updated.  No problems noted.  Pt stated he does have a BP cuff and recommended he start checking his BP and keep a journal to show MD.  Pt states he will do that.    Hart Robinsons A 08/24/2017 9:03 AM

## 2017-08-24 NOTE — Progress Notes (Signed)
Cardiac Individual Treatment Plan  Patient Details  Name: Paul Snyder MRN: [id number] Date of Birth: 06/26/1935 Referring Provider:     CARDIAC REHAB PHASE II ORIENTATION from 08/24/2017 in [place]  Referring Provider  Dr. [name]      Initial Encounter Date:    CARDIAC REHAB PHASE II ORIENTATION from 08/24/2017 in [place]  Date  08/24/17  Referring Provider  Dr. [name]      Visit Diagnosis: NSTEMI (non-ST elevated myocardial infarction) [facility])  Status post coronary artery stent placement  Patient's Home Medications on Admission:  Current Outpatient Medications:  .  albuterol (PROVENTIL HFA;VENTOLIN HFA) 108 (90 Base) MCG/ACT inhaler, Inhale 2 puffs into the lungs 4 (four) times daily. , Disp: , Rfl:  .  aspirin EC 81 MG EC tablet, Take 1 tablet (81 mg total) by mouth daily., Disp: , Rfl:  .  atorvastatin (LIPITOR) 80 MG tablet, Take 1 tablet (80 mg total) by mouth daily at 6 PM., Disp: 90 tablet, Rfl: 3 .  furosemide (LASIX) 20 MG tablet, Take 20 mg by mouth daily., Disp: , Rfl:  .  metoprolol tartrate (LOPRESSOR) 25 MG tablet, Take 0.5 tablets (12.5 mg total) by mouth 2 (two) times daily., Disp: 180 tablet, Rfl: 3 .  niacin 500 MG tablet, Take 500 mg by mouth daily. , Disp: , Rfl:  .  nitroGLYCERIN (NITROSTAT) 0.4 MG SL tablet, Place 1 tablet (0.4 mg total) under the tongue every 5 (five) minutes x 3 doses as needed for chest pain., Disp: 25 tablet, Rfl: 2 .  pantoprazole (PROTONIX) 40 MG tablet, Take 40 mg by mouth daily., Disp: , Rfl:  .  ticagrelor (BRILINTA) 90 MG TABS tablet, Take 1 tablet (90 mg total) by mouth 2 (two) times daily., Disp: 180 tablet, Rfl: 2  Past Medical History: Past Medical History:  Diagnosis Date  . CHF (congestive heart failure) ([place])   . COPD (chronic obstructive pulmonary disease) ([place])   . GERD (gastroesophageal reflux disease)   . Hyperlipidemia   . Lung cancer ([place]) 2009  . NSTEMI (non-ST  elevated myocardial infarction) ([place]) 07/16/2017   2/19 PCI/DES x1 to Lcx with moderate (50%) in the RCA, mild (20%) in LAD    Tobacco Use: Social History   Tobacco Use  Smoking Status Former Smoker  . Packs/day: 1.00  . Years: 42.00  . Pack years: 42.00  . Types: Cigarettes  . Last attempt to quit: 1996  . Years since quitting: 23.2  Smokeless Tobacco Never Used    Labs: Recent Review Scientist, physiological    Labs for ITP Cardiac and Pulmonary Rehab Latest Ref Rng & Units 07/20/2017   Cholestrol 0 - 200 mg/dL 126   LDLCALC 0 - 99 mg/dL 71   HDL >40 mg/dL 32(L)   Trlycerides <150 mg/dL 117   Hemoglobin A1c 4.8 - 5.6 % 5.8(H)      Capillary Blood Glucose: Lab Results  Component Value Date   GLUCAP 94 01/21/2009     Exercise Target Goals: Date: 08/24/17  Exercise Program Goal: Individual exercise prescription set using results from initial 6 min walk test and THRR while considering  patient's activity barriers and safety.   Exercise Prescription Goal: Starting with aerobic activity 30 plus minutes a day, 3 days per week for initial exercise prescription. Provide home exercise prescription and guidelines that participant acknowledges understanding prior to discharge.  Activity Barriers & Risk Stratification: Activity Barriers & Cardiac Risk Stratification - 08/24/17 1002  Activity Barriers & Cardiac Risk Stratification   Activity Barriers  Shortness of Breath;Deconditioning    Cardiac Risk Stratification  High       6 Minute Walk: 6 Minute Walk    Row Name 08/24/17 1002         6 Minute Walk   Phase  Initial     Distance  850 feet     Distance % Change  0 %     Distance Feet Change  0 ft     Walk Time  6 minutes     # of Rest Breaks  0     MPH  1.6     METS  2.23     RPE  12     Perceived Dyspnea   11     VO2 Peak  6.16     Symptoms  No     Resting HR  51 bpm     Resting BP  100/50     Resting Oxygen Saturation   95 %     Exercise Oxygen Saturation   during 6 min walk  93 %     Max Ex. HR  89 bpm     Max Ex. BP  128/58     2 Minute Post BP  106/52        Oxygen Initial Assessment:   Oxygen Re-Evaluation:   Oxygen Discharge (Final Oxygen Re-Evaluation):   Initial Exercise Prescription: Initial Exercise Prescription - 08/24/17 1000      Date of Initial Exercise RX and Referring Provider   Date  08/24/17    Referring Provider  Dr. [name]      Treadmill   MPH  0.8    Grade  0    Minutes  15    METs  1.6      NuStep   Level  1    SPM  56    Minutes  20    METs  1.6      Prescription Details   Frequency (times per week)  3    Duration  Progress to 30 minutes of continuous aerobic without signs/symptoms of physical distress      Intensity   THRR 40-80% of Max Heartrate  [phone]    Ratings of Perceived Exertion  11-13    Perceived Dyspnea  0-4      Progression   Progression  Continue progressive overload as per policy without signs/symptoms or physical distress.      Resistance Training   Training Prescription  Yes    Weight  1    Reps  10-15       Perform Capillary Blood Glucose checks as needed.  Exercise Prescription Changes:   Exercise Comments:   Exercise Goals and Review:  Exercise Goals    Row Name 08/24/17 1004             Exercise Goals   Increase Physical Activity  Yes       Intervention  Develop an individualized exercise prescription for aerobic and resistive training based on initial evaluation findings, risk stratification, comorbidities and participant's personal goals.;Provide advice, education, support and counseling about physical activity/exercise needs.       Expected Outcomes  Short Term: Attend rehab on a regular basis to increase amount of physical activity.;Long Term: Exercising regularly at least 3-5 days a week.;Long Term: Add in home exercise to make exercise part of routine and to increase amount of physical activity.  Increase Strength and Stamina  Yes        Intervention  Develop an individualized exercise prescription for aerobic and resistive training based on initial evaluation findings, risk stratification, comorbidities and participant's personal goals.;Provide advice, education, support and counseling about physical activity/exercise needs.       Expected Outcomes  Short Term: Increase workloads from initial exercise prescription for resistance, speed, and METs.       Able to understand and use rate of perceived exertion (RPE) scale  Yes       Intervention  Provide education and explanation on how to use RPE scale       Expected Outcomes  Short Term: Able to use RPE daily in rehab to express subjective intensity level;Long Term:  Able to use RPE to guide intensity level when exercising independently       Able to understand and use Dyspnea scale  Yes       Intervention  Provide education and explanation on how to use Dyspnea scale       Expected Outcomes  Short Term: Able to use Dyspnea scale daily in rehab to express subjective sense of shortness of breath during exertion;Long Term: Able to use Dyspnea scale to guide intensity level when exercising independently       Knowledge and understanding of Target Heart Rate Range (THRR)  Yes       Intervention  Provide education and explanation of THRR including how the numbers were predicted and where they are located for reference       Expected Outcomes  Short Term: Able to state/look up THRR;Long Term: Able to use THRR to govern intensity when exercising independently;Short Term: Able to use daily as guideline for intensity in rehab       Able to check pulse independently  Yes       Intervention  Provide education and demonstration on how to check pulse in carotid and radial arteries.;Review the importance of being able to check your own pulse for safety during independent exercise       Expected Outcomes  Short Term: Able to explain why pulse checking is important during independent exercise;Long Term:  Able to check pulse independently and accurately       Understanding of Exercise Prescription  Yes       Intervention  Provide education, explanation, and written materials on patient's individual exercise prescription       Expected Outcomes  Short Term: Able to explain program exercise prescription;Long Term: Able to explain home exercise prescription to exercise independently          Exercise Goals Re-Evaluation :    Discharge Exercise Prescription (Final Exercise Prescription Changes):   Nutrition:  Target Goals: Understanding of nutrition guidelines, daily intake of sodium 1500mg , cholesterol 200mg , calories 30% from fat and 7% or less from saturated fats, daily to have 5 or more servings of fruits and vegetables.  Biometrics: Pre Biometrics - 08/24/17 1004      Pre Biometrics   Height  6' (1.829 m)    Weight  178 lb 3.2 oz (80.8 kg)    Waist Circumference  41.5 inches    Hip Circumference  39 inches    Waist to Hip Ratio  1.06 %    BMI (Calculated)  24.16    Triceps Skinfold  8 mm    % Body Fat  24.6 %    Grip Strength  61.26 kg    Flexibility  0 in    Single  Leg Stand  1 seconds        Nutrition Therapy Plan and Nutrition Goals: Nutrition Therapy & Goals - 08/24/17 1018      Personal Nutrition Goals   Personal Goal #2  Patient's family (son and daughter) are helping him with a heart healthy diet.     Additional Goals?  No       Nutrition Assessments:   Nutrition Goals Re-Evaluation:   Nutrition Goals Discharge (Final Nutrition Goals Re-Evaluation):   Psychosocial: Target Goals: Acknowledge presence or absence of significant depression and/or stress, maximize coping skills, provide positive support system. Participant is able to verbalize types and ability to use techniques and skills needed for reducing stress and depression.  Initial Review & Psychosocial Screening: Initial Psych Review & Screening - 08/24/17 1020      Initial Review   Current  issues with  None Identified      Family Dynamics   Good Support System?  Yes      Barriers   Psychosocial barriers to participate in program  There are no identifiable barriers or psychosocial needs.      Screening Interventions   Interventions  Encouraged to exercise    Expected Outcomes  Short Term goal: Identification and review with participant of any Quality of Life or Depression concerns found by scoring the questionnaire.;Long Term goal: The participant improves quality of Life and PHQ9 Scores as seen by post scores and/or verbalization of changes       Quality of Life Scores: Quality of Life - 08/24/17 1005      Quality of Life Scores   Health/Function Pre  22.83 %    Socioeconomic Pre  23.88 %    Psych/Spiritual Pre  29.14 %    Family Pre  28.8 %    GLOBAL Pre  25.19 %      Scores of 19 and below usually indicate a poorer quality of life in these areas.  A difference of  2-3 points is a clinically meaningful difference.  A difference of 2-3 points in the total score of the Quality of Life Index has been associated with significant improvement in overall quality of life, self-image, physical symptoms, and general health in studies assessing change in quality of life.  PHQ-9: Recent Review Flowsheet Data    Depression screen [facility] 2/9 08/24/2017   Decreased Interest 0   Down, Depressed, Hopeless 0   PHQ - 2 Score 0   Altered sleeping 0   Tired, decreased energy 0   Change in appetite 0   Feeling bad or failure about yourself  0   Trouble concentrating 0   Moving slowly or fidgety/restless 0   Suicidal thoughts 0   PHQ-9 Score 0   Difficult doing work/chores Not difficult at all     Interpretation of Total Score  Total Score Depression Severity:  1-4 = Minimal depression, 5-9 = Mild depression, 10-14 = Moderate depression, 15-19 = Moderately severe depression, 20-27 = Severe depression   Psychosocial Evaluation and Intervention: Psychosocial Evaluation - 08/24/17  1021      Psychosocial Evaluation & Interventions   Interventions  Encouraged to exercise with the program and follow exercise prescription    Continue Psychosocial Services   No Follow up required       Psychosocial Re-Evaluation:   Psychosocial Discharge (Final Psychosocial Re-Evaluation):   Vocational Rehabilitation: Provide vocational rehab assistance to qualifying candidates.   Vocational Rehab Evaluation & Intervention: Vocational Rehab - 08/24/17 1014  Initial Vocational Rehab Evaluation & Intervention   Assessment shows need for Vocational Rehabilitation  No       Education: Education Goals: Education classes will be provided on a weekly basis, covering required topics. Participant will state understanding/return demonstration of topics presented.  Learning Barriers/Preferences: Learning Barriers/Preferences - 08/24/17 1012      Learning Barriers/Preferences   Learning Barriers  Hearing    Learning Preferences  Video;Written Material;Pictoral;Computer/Internet       Education Topics: Hypertension, Hypertension Reduction -Define heart disease and high blood pressure. Discus how high blood pressure affects the body and ways to reduce high blood pressure.   Exercise and Your Heart -Discuss why it is important to exercise, the FITT principles of exercise, normal and abnormal responses to exercise, and how to exercise safely.   Angina -Discuss definition of angina, causes of angina, treatment of angina, and how to decrease risk of having angina.   Cardiac Medications -Review what the following cardiac medications are used for, how they affect the body, and side effects that may occur when taking the medications.  Medications include Aspirin, Beta blockers, calcium channel blockers, ACE Inhibitors, angiotensin receptor blockers, diuretics, digoxin, and antihyperlipidemics.   Congestive Heart Failure -Discuss the definition of CHF, how to live with CHF, the  signs and symptoms of CHF, and how keep track of weight and sodium intake.   Heart Disease and Intimacy -Discus the effect sexual activity has on the heart, how changes occur during intimacy as we age, and safety during sexual activity.   Smoking Cessation / COPD -Discuss different methods to quit smoking, the health benefits of quitting smoking, and the definition of COPD.   Nutrition I: Fats -Discuss the types of cholesterol, what cholesterol does to the heart, and how cholesterol levels can be controlled.   Nutrition II: Labels -Discuss the different components of food labels and how to read food label   Heart Parts/Heart Disease and PAD -Discuss the anatomy of the heart, the pathway of blood circulation through the heart, and these are affected by heart disease.   Stress I: Signs and Symptoms -Discuss the causes of stress, how stress may lead to anxiety and depression, and ways to limit stress.   Stress II: Relaxation -Discuss different types of relaxation techniques to limit stress.   Warning Signs of Stroke / TIA -Discuss definition of a stroke, what the signs and symptoms are of a stroke, and how to identify when someone is having stroke.   Knowledge Questionnaire Score: Knowledge Questionnaire Score - 08/24/17 1014      Knowledge Questionnaire Score   Pre Score  20/24       Core Components/Risk Factors/Patient Goals at Admission: Personal Goals and Risk Factors at Admission - 08/24/17 1018      Core Components/Risk Factors/Patient Goals on Admission    Weight Management  Weight Maintenance    Personal Goal Other  Yes    Personal Goal  Become more heart healthy and breathe better.     Intervention  Attend CR 3 x week and supplement with home exercise 2 x week.    Expected Outcomes  Reach personal goals       Core Components/Risk Factors/Patient Goals Review:    Core Components/Risk Factors/Patient Goals at Discharge (Final Review):    ITP  Comments: ITP Comments    Row Name 08/24/17 1016           ITP Comments  Patient is been referred to Cardiac Rehab by Dr. Irish Lack due  to NSTEMI and Stent placement.           Comments: Patient arrived for 1st visit/orientation/education at 0800. Patient was referred to CR by Dr. Irish Lack due to NSTEMI (I21.4) and Stent Placement (Z95.5). During orientation advised patient on arrival and appointment times what to wear, what to do before, during and after exercise. Reviewed attendance and class policy. Talked about inclement weather and class consultation policy. Pt is scheduled to return Cardiac Rehab on 08/28/17 at 0815. Pt was advised to come to class 15 minutes before class starts. Patient was also given instructions on meeting with the dietician and attending the Family Structure classes. Discussed RPE/Dpysnea scales. Discussed initial THR and how to find their radial and/or carotid pulse. Discussed the initial exercise prescription and how this effects their progress. Pt is eager to get started. Patient participated in warm up stretches followed by light weights and resistance bands. Patient was able to complete 6 minute walk test. Patient had to push w/c at 4 minutes 30 seconds due to fatigue and SOB, but was able to complete the test. Patient did not c/o pain. Patient was measured for the equipment. Discussed equipment safety with patient. Took patient pre-anthropometric measurements. Patient finished visit at 10:00.

## 2017-08-24 NOTE — Progress Notes (Signed)
Daily Session Note  Patient Details  Name: LOIS OSTROM MRN: [id number] Date of Birth: 04-10-1936 Referring Provider:     CARDIAC REHAB PHASE II ORIENTATION from 08/24/2017 in [place]  Referring Provider  Dr. [name]      Encounter Date: 08/24/2017  Check In: Session Check In - 08/24/17 1014      Check-In   Location  [facility]    Staff Present  Clerence Gubser Angelina Pih, MS, EP, [facility], Exercise Physiologist;Gregory Luther Parody, BS, EP, Exercise Physiologist;Debra Wynetta Emery, RN, BSN    Supervising physician immediately available to respond to emergencies  See telemetry face sheet for immediately available MD    Medication changes reported      No    Fall or balance concerns reported     No    Tobacco Cessation  -- Quit 07/27/1994    Warm-up and Cool-down  Performed as group-led instruction    Resistance Training Performed  Yes    VAD Patient?  No      Pain Assessment   Currently in Pain?  No/denies    Pain Score  0-No pain    Multiple Pain Sites  No       Capillary Blood Glucose: No results found for this or any previous visit (from the past 24 hour(s)).    Social History   Tobacco Use  Smoking Status Former Smoker  . Packs/day: 1.00  . Years: 42.00  . Pack years: 42.00  . Types: Cigarettes  . Last attempt to quit: 1996  . Years since quitting: 23.2  Smokeless Tobacco Never Used    Goals Met:  Independence with exercise equipment Exercise tolerated well No report of cardiac concerns or symptoms Strength training completed today  Goals Unmet:  Not Applicable  Comments: Check out: 10:00   Dr. Kate Sable is Medical Director for [place] and Pulmonary Rehab.

## 2017-08-28 ENCOUNTER — Encounter (HOSPITAL_COMMUNITY)
Admission: RE | Admit: 2017-08-28 | Discharge: 2017-08-28 | Disposition: A | Discharge status: Home / Self Care | Payer: Medicare Other | Source: Ambulatory Visit | Attending: Cardiology | Admitting: Cardiology

## 2017-08-28 DIAGNOSIS — I214 Non-ST elevation (NSTEMI) myocardial infarction: Secondary | ICD-10-CM | POA: Diagnosis not present

## 2017-08-28 NOTE — Progress Notes (Signed)
Daily Session Note  Patient Details  Name: Paul Snyder MRN: [id number] Date of Birth: [birth year] Referring Provider:     CARDIAC REHAB PHASE II ORIENTATION from 08/24/2017 in [place]  Referring Provider  Dr. [name]      Encounter Date: 08/28/2017  Check In: Session Check In - 08/28/17 0815      Check-In   Location  [facility]    Staff Present  Russella Dar, MS, EP, [facility], Exercise Physiologist;Gregory Luther Parody, BS, EP, Exercise Physiologist    Supervising physician immediately available to respond to emergencies  See telemetry face sheet for immediately available MD    Medication changes reported      No    Fall or balance concerns reported     No    Warm-up and Cool-down  Performed as group-led instruction    Resistance Training Performed  Yes    VAD Patient?  No      Pain Assessment   Currently in Pain?  No/denies    Pain Score  0-No pain    Multiple Pain Sites  No       Capillary Blood Glucose: No results found for this or any previous visit (from the past 24 hour(s)).    Social History   Tobacco Use  Smoking Status Former Smoker  . Packs/day: 1.00  . Years: 42.00  . Pack years: 42.00  . Types: Cigarettes  . Last attempt to quit: 1996  . Years since quitting: 23.2  Smokeless Tobacco Never Used    Goals Met:  Independence with exercise equipment Exercise tolerated well No report of cardiac concerns or symptoms Strength training completed today  Goals Unmet:  Not Applicable  Comments: Check out:    Dr. Kate Sable is Medical Director for [place] and Pulmonary Rehab.

## 2017-08-30 ENCOUNTER — Encounter (HOSPITAL_COMMUNITY)
Admission: RE | Admit: 2017-08-30 | Discharge: 2017-08-30 | Disposition: A | Discharge status: Home / Self Care | Payer: Medicare Other | Source: Ambulatory Visit | Attending: Cardiology | Admitting: Cardiology

## 2017-08-30 DIAGNOSIS — I214 Non-ST elevation (NSTEMI) myocardial infarction: Secondary | ICD-10-CM | POA: Diagnosis not present

## 2017-08-30 DIAGNOSIS — Z955 Presence of coronary angioplasty implant and graft: Secondary | ICD-10-CM

## 2017-08-30 NOTE — Progress Notes (Signed)
Daily Session Note  Patient Details  Name: Paul Snyder MRN: [id number] Date of Birth: [birth year] Referring Provider:     CARDIAC REHAB PHASE II ORIENTATION from 08/24/2017 in [place]  Referring Provider  Dr. [name]      Encounter Date: 08/30/2017  Check In: Session Check In - 08/30/17 0851      Check-In   Location  [facility]    Staff Present  Russella Dar, MS, EP, [facility], Exercise Physiologist;Ceonna Frazzini Luther Parody, BS, EP, Exercise Physiologist;Debra Wynetta Emery, RN, BSN    Supervising physician immediately available to respond to emergencies  See telemetry face sheet for immediately available MD    Medication changes reported      No    Fall or balance concerns reported     No    Warm-up and Cool-down  Performed as group-led instruction    Resistance Training Performed  Yes    VAD Patient?  No      Pain Assessment   Currently in Pain?  No/denies    Pain Score  0-No pain    Multiple Pain Sites  No       Capillary Blood Glucose: No results found for this or any previous visit (from the past 24 hour(s)).    Social History   Tobacco Use  Smoking Status Former Smoker  . Packs/day: 1.00  . Years: 42.00  . Pack years: 42.00  . Types: Cigarettes  . Last attempt to quit: 1996  . Years since quitting: 23.2  Smokeless Tobacco Never Used    Goals Met:  Independence with exercise equipment Exercise tolerated well No report of cardiac concerns or symptoms Strength training completed today  Goals Unmet:  Not Applicable  Comments: Check out 915   Dr. Kate Sable is Medical Director for [place] and Pulmonary Rehab.

## 2017-09-01 ENCOUNTER — Encounter (HOSPITAL_COMMUNITY)
Admission: RE | Admit: 2017-09-01 | Discharge: 2017-09-01 | Disposition: A | Discharge status: Home / Self Care | Payer: Medicare Other | Source: Ambulatory Visit | Attending: Cardiology | Admitting: Cardiology

## 2017-09-01 DIAGNOSIS — I214 Non-ST elevation (NSTEMI) myocardial infarction: Secondary | ICD-10-CM

## 2017-09-01 DIAGNOSIS — Z955 Presence of coronary angioplasty implant and graft: Secondary | ICD-10-CM

## 2017-09-01 NOTE — Progress Notes (Signed)
Daily Session Note  Patient Details  Name: Paul Snyder MRN: [id number] Date of Birth: 25-Jan-1936 Referring Provider:     CARDIAC REHAB PHASE II ORIENTATION from 08/24/2017 in [place]  Referring Provider  Dr. [name]      Encounter Date: 09/01/2017  Check In: Session Check In - 09/01/17 0815      Check-In   Location  [facility]    Staff Present  Aundra Dubin, RN, BSN;Gregory Luther Parody, BS, EP, Exercise Physiologist    Supervising physician immediately available to respond to emergencies  See telemetry face sheet for immediately available MD    Medication changes reported      No    Fall or balance concerns reported     No    Warm-up and Cool-down  Performed as group-led instruction    Resistance Training Performed  Yes    VAD Patient?  No      Pain Assessment   Currently in Pain?  No/denies    Pain Score  0-No pain    Multiple Pain Sites  No       Capillary Blood Glucose: No results found for this or any previous visit (from the past 24 hour(s)).    Social History   Tobacco Use  Smoking Status Former Smoker  . Packs/day: 1.00  . Years: 42.00  . Pack years: 42.00  . Types: Cigarettes  . Last attempt to quit: 1996  . Years since quitting: 23.2  Smokeless Tobacco Never Used    Goals Met:  Independence with exercise equipment Exercise tolerated well No report of cardiac concerns or symptoms Strength training completed today  Goals Unmet:  Not Applicable  Comments: Check out 915.   Dr. Kate Sable is Medical Director for [facility] Cardiac and Pulmonary Rehab.

## 2017-09-04 ENCOUNTER — Encounter (HOSPITAL_COMMUNITY)
Admission: RE | Admit: 2017-09-04 | Discharge: 2017-09-04 | Disposition: A | Discharge status: Home / Self Care | Payer: Medicare Other | Source: Ambulatory Visit | Attending: Cardiology | Admitting: Cardiology

## 2017-09-04 DIAGNOSIS — I214 Non-ST elevation (NSTEMI) myocardial infarction: Secondary | ICD-10-CM | POA: Diagnosis not present

## 2017-09-04 DIAGNOSIS — Z955 Presence of coronary angioplasty implant and graft: Secondary | ICD-10-CM

## 2017-09-04 NOTE — Progress Notes (Signed)
Daily Session Note  Patient Details  Name: HAFIZ IRION MRN: [id number] Date of Birth: December 29, 1935 Referring Provider:     CARDIAC REHAB PHASE II ORIENTATION from 08/24/2017 in [place]  Referring Provider  Dr. [name]      Encounter Date: 09/04/2017  Check In: Session Check In - 09/04/17 0815      Check-In   Location  [facility]    Staff Present  Aundra Dubin, RN, BSN;Gregory Luther Parody, BS, EP, Exercise Physiologist    Supervising physician immediately available to respond to emergencies  See telemetry face sheet for immediately available MD    Medication changes reported      No    Fall or balance concerns reported     No    Warm-up and Cool-down  Performed as group-led instruction    Resistance Training Performed  Yes    VAD Patient?  No      Pain Assessment   Currently in Pain?  No/denies    Pain Score  0-No pain    Multiple Pain Sites  No       Capillary Blood Glucose: No results found for this or any previous visit (from the past 24 hour(s)).    Social History   Tobacco Use  Smoking Status Former Smoker  . Packs/day: 1.00  . Years: 42.00  . Pack years: 42.00  . Types: Cigarettes  . Last attempt to quit: 1996  . Years since quitting: 23.3  Smokeless Tobacco Never Used    Goals Met:  Independence with exercise equipment Exercise tolerated well No report of cardiac concerns or symptoms Strength training completed today  Goals Unmet:  Not Applicable  Comments: Check out 915.   Dr. Kate Sable is Medical Director for [facility] and [facility]monary Rehab.

## 2017-09-06 ENCOUNTER — Encounter (HOSPITAL_COMMUNITY)
Admission: RE | Admit: 2017-09-06 | Discharge: 2017-09-06 | Disposition: A | Discharge status: Home / Self Care | Payer: Medicare Other | Source: Ambulatory Visit | Attending: Cardiology | Admitting: Cardiology

## 2017-09-06 DIAGNOSIS — Z955 Presence of coronary angioplasty implant and graft: Secondary | ICD-10-CM

## 2017-09-06 DIAGNOSIS — I214 Non-ST elevation (NSTEMI) myocardial infarction: Secondary | ICD-10-CM | POA: Diagnosis not present

## 2017-09-06 NOTE — Progress Notes (Signed)
Daily Session Note  Patient Details  Name: Paul Snyder MRN: [id number] Date of Birth: 1936/03/11 Referring Provider:     CARDIAC REHAB PHASE II ORIENTATION from 08/24/2017 in [place]  Referring Provider  Dr. [name]      Encounter Date: 09/06/2017  Check In: Session Check In - 09/06/17 1478      Check-In   Location  [facility]    Staff Present  Aundra Dubin, RN, BSN;Sparsh Callens Luther Parody, BS, EP, Exercise Physiologist    Supervising physician immediately available to respond to emergencies  See telemetry face sheet for immediately available MD    Medication changes reported      No    Fall or balance concerns reported     No    Warm-up and Cool-down  Performed as group-led instruction    Resistance Training Performed  Yes    VAD Patient?  No      Pain Assessment   Currently in Pain?  No/denies    Pain Score  0-No pain    Multiple Pain Sites  No       Capillary Blood Glucose: No results found for this or any previous visit (from the past 24 hour(s)).    Social History   Tobacco Use  Smoking Status Former Smoker  . Packs/day: 1.00  . Years: 42.00  . Pack years: 42.00  . Types: Cigarettes  . Last attempt to quit: 1996  . Years since quitting: 23.3  Smokeless Tobacco Never Used    Goals Met:  Independence with exercise equipment Exercise tolerated well No report of cardiac concerns or symptoms Strength training completed today  Goals Unmet:  Not Applicable  Comments: Check out 915   Dr. Kate Sable is Medical Director for [place] and Pulmonary Rehab.

## 2017-09-07 DIAGNOSIS — K219 Gastro-esophageal reflux disease without esophagitis: Secondary | ICD-10-CM | POA: Insufficient documentation

## 2017-09-07 DIAGNOSIS — J449 Chronic obstructive pulmonary disease, unspecified: Secondary | ICD-10-CM | POA: Insufficient documentation

## 2017-09-08 ENCOUNTER — Encounter (HOSPITAL_COMMUNITY)
Admission: RE | Admit: 2017-09-08 | Discharge: 2017-09-08 | Disposition: A | Discharge status: Home / Self Care | Payer: Medicare Other | Source: Ambulatory Visit | Attending: Cardiology | Admitting: Cardiology

## 2017-09-08 DIAGNOSIS — I214 Non-ST elevation (NSTEMI) myocardial infarction: Secondary | ICD-10-CM

## 2017-09-08 DIAGNOSIS — Z955 Presence of coronary angioplasty implant and graft: Secondary | ICD-10-CM

## 2017-09-08 NOTE — Progress Notes (Signed)
Daily Session Note  Patient Details  Name: Paul Snyder MRN: [id number] Date of Birth: 1936/01/04 Referring Provider:     CARDIAC REHAB PHASE II ORIENTATION from 08/24/2017 in [place]  Referring Provider  Dr. [name]      Encounter Date: 09/08/2017  Check In: Session Check In - 09/08/17 0815      Check-In   Location  [facility]    Staff Present  Aundra Dubin, RN, BSN;Gregory Luther Parody, BS, EP, Exercise Physiologist    Supervising physician immediately available to respond to emergencies  See telemetry face sheet for immediately available MD    Medication changes reported      No    Fall or balance concerns reported     No    Warm-up and Cool-down  Performed as group-led instruction    Resistance Training Performed  Yes    VAD Patient?  No      Pain Assessment   Currently in Pain?  No/denies    Pain Score  0-No pain    Multiple Pain Sites  No       Capillary Blood Glucose: No results found for this or any previous visit (from the past 24 hour(s)).    Social History   Tobacco Use  Smoking Status Former Smoker  . Packs/day: 1.00  . Years: 42.00  . Pack years: 42.00  . Types: Cigarettes  . Last attempt to quit: 1996  . Years since quitting: 23.3  Smokeless Tobacco Never Used    Goals Met:  Independence with exercise equipment Exercise tolerated well No report of cardiac concerns or symptoms Strength training completed today  Goals Unmet:  Not Applicable  Comments: Check out 915.   Dr. Kate Sable is Medical Director for [facility] Card[facility] and Pulmonary Rehab.

## 2017-09-11 ENCOUNTER — Encounter (HOSPITAL_COMMUNITY)
Admission: RE | Admit: 2017-09-11 | Discharge: 2017-09-11 | Disposition: A | Discharge status: Home / Self Care | Payer: Medicare Other | Source: Ambulatory Visit | Attending: Cardiology | Admitting: Cardiology

## 2017-09-11 DIAGNOSIS — I214 Non-ST elevation (NSTEMI) myocardial infarction: Secondary | ICD-10-CM | POA: Diagnosis not present

## 2017-09-11 DIAGNOSIS — Z955 Presence of coronary angioplasty implant and graft: Secondary | ICD-10-CM

## 2017-09-11 NOTE — Progress Notes (Signed)
Daily Session Note  Patient Details  Name: GUERIN LASHOMB MRN: [id number] Date of Birth: [birth year] Referring Provider:     CARDIAC REHAB PHASE II ORIENTATION from 08/24/2017 in [place]  Referring Provider  Dr. [name]      Encounter Date: 09/11/2017  Check In: Session Check In - 09/11/17 0815      Check-In   Location  [facility]    Staff Present  Aundra Dubin, RN, BSN;Gregory Luther Parody, BS, EP, Exercise Physiologist    Supervising physician immediately available to respond to emergencies  See telemetry face sheet for immediately available MD    Medication changes reported      No    Fall or balance concerns reported     No    Warm-up and Cool-down  Performed as group-led instruction    Resistance Training Performed  Yes    VAD Patient?  No      Pain Assessment   Currently in Pain?  No/denies    Pain Score  0-No pain    Multiple Pain Sites  No       Capillary Blood Glucose: No results found for this or any previous visit (from the past 24 hour(s)).    Social History   Tobacco Use  Smoking Status Former Smoker  . Packs/day: 1.00  . Years: 42.00  . Pack years: 42.00  . Types: Cigarettes  . Last attempt to quit: 1996  . Years since quitting: 23.3  Smokeless Tobacco Never Used    Goals Met:  Independence with exercise equipment Exercise tolerated well No report of cardiac concerns or symptoms Strength training completed today  Goals Unmet:  Not Applicable  Comments: Check out 915.   Dr. Kate Sable is Medical Director for [facility] Card[facility] and Pulmonary Rehab.

## 2017-09-13 ENCOUNTER — Encounter (HOSPITAL_COMMUNITY)
Admission: RE | Admit: 2017-09-13 | Discharge: 2017-09-13 | Disposition: A | Discharge status: Home / Self Care | Payer: Medicare Other | Source: Ambulatory Visit | Attending: Cardiology | Admitting: Cardiology

## 2017-09-13 DIAGNOSIS — Z955 Presence of coronary angioplasty implant and graft: Secondary | ICD-10-CM

## 2017-09-13 DIAGNOSIS — I214 Non-ST elevation (NSTEMI) myocardial infarction: Secondary | ICD-10-CM | POA: Diagnosis not present

## 2017-09-13 NOTE — Progress Notes (Signed)
Daily Session Note  Patient Details  Name: Paul Snyder MRN: [id number] Date of Birth: 1936-03-30 Referring Provider:     CARDIAC REHAB PHASE II ORIENTATION from 08/24/2017 in [place]  Referring Provider  Dr. [name]      Encounter Date: 09/13/2017  Check In: Session Check In - 09/13/17 0815      Check-In   Location  [facility]    Staff Present  Aundra Dubin, RN, BSN;Gregory Luther Parody, BS, EP, Exercise Physiologist    Supervising physician immediately available to respond to emergencies  See telemetry face sheet for immediately available MD    Medication changes reported      No    Fall or balance concerns reported     No    Warm-up and Cool-down  Performed as group-led instruction    Resistance Training Performed  Yes    VAD Patient?  No      Pain Assessment   Currently in Pain?  No/denies    Pain Score  0-No pain    Multiple Pain Sites  No       Capillary Blood Glucose: No results found for this or any previous visit (from the past 24 hour(s)).    Social History   Tobacco Use  Smoking Status Former Smoker  . Packs/day: 1.00  . Years: 42.00  . Pack years: 42.00  . Types: Cigarettes  . Last attempt to quit: 1996  . Years since quitting: 23.3  Smokeless Tobacco Never Used    Goals Met:  Independence with exercise equipment Exercise tolerated well No report of cardiac concerns or symptoms Strength training completed today  Goals Unmet:  Not Applicable  Comments: Check out 915.   Dr. Kate Sable is Medical Director for [facility] Card[facility] and Pulmonary Rehab.

## 2017-09-15 ENCOUNTER — Encounter (HOSPITAL_COMMUNITY)
Admission: RE | Admit: 2017-09-15 | Discharge: 2017-09-15 | Disposition: A | Discharge status: Home / Self Care | Payer: Medicare Other | Source: Ambulatory Visit | Attending: Cardiology | Admitting: Cardiology

## 2017-09-15 DIAGNOSIS — I214 Non-ST elevation (NSTEMI) myocardial infarction: Secondary | ICD-10-CM | POA: Diagnosis not present

## 2017-09-15 NOTE — Progress Notes (Signed)
Daily Session Note  Patient Details  Name: Paul Snyder MRN: [id number] Date of Birth: 1935/10/19 Referring Provider:     [place] from 08/24/2017 in [place]  Referring Provider  Dr. [name]      Encounter Date: 09/15/2017  Check In: Session Check In - 09/15/17 0815      Check-In   Location  [facility]    Staff Present  Russella Dar, MS, EP, [facility], Exercise Physiologist;Gregory Luther Parody, BS, EP, Exercise Physiologist    Supervising physician immediately available to respond to emergencies  See telemetry face sheet for immediately available MD    Medication changes reported      No    Fall or balance concerns reported     No    Warm-up and Cool-down  Performed as group-led instruction    Resistance Training Performed  Yes    VAD Patient?  No      Pain Assessment   Currently in Pain?  No/denies    Pain Score  0-No pain    Multiple Pain Sites  No       Capillary Blood Glucose: No results found for this or any previous visit (from the past 24 hour(s)).    Social History   Tobacco Use  Smoking Status Former Smoker  . Packs/day: 1.00  . Years: 42.00  . Pack years: 42.00  . Types: Cigarettes  . Last attempt to quit: 1996  . Years since quitting: 23.3  Smokeless Tobacco Never Used    Goals Met:  Independence with exercise equipment Exercise tolerated well No report of cardiac concerns or symptoms Strength training completed today  Goals Unmet:  Not Applicable  Comments: Check out: 0915   Dr. Kate Sable is Medical Director for [place] and Pulmonary Rehab.

## 2017-09-18 ENCOUNTER — Encounter (HOSPITAL_COMMUNITY)
Admission: RE | Admit: 2017-09-18 | Discharge: 2017-09-18 | Disposition: A | Discharge status: Home / Self Care | Payer: Medicare Other | Source: Ambulatory Visit | Attending: Cardiology | Admitting: Cardiology

## 2017-09-18 DIAGNOSIS — I214 Non-ST elevation (NSTEMI) myocardial infarction: Secondary | ICD-10-CM | POA: Diagnosis not present

## 2017-09-18 NOTE — Progress Notes (Signed)
Daily Session Note  Patient Details  Name: EDOUARD GIKAS MRN: [id number] Date of Birth: 04/13/1936 Referring Provider:     CARDIAC REHAB PHASE II ORIENTATION from 08/24/2017 in [place]  Referring Provider  Dr. [name]      Encounter Date: 09/18/2017  Check In: Session Check In - 09/18/17 0859      Check-In   Location  [facility]    Staff Present  Russella Dar, MS, EP, [facility], Exercise Physiologist;Gregory Luther Parody, BS, EP, Exercise Physiologist    Supervising physician immediately available to respond to emergencies  See telemetry face sheet for immediately available MD    Medication changes reported      No    Fall or balance concerns reported     No    Warm-up and Cool-down  Performed as group-led instruction    Resistance Training Performed  Yes    VAD Patient?  No      Pain Assessment   Currently in Pain?  No/denies    Pain Score  0-No pain    Multiple Pain Sites  No       Capillary Blood Glucose: No results found for this or any previous visit (from the past 24 hour(s)).    Social History   Tobacco Use  Smoking Status Former Smoker  . Packs/day: 1.00  . Years: 42.00  . Pack years: 42.00  . Types: Cigarettes  . Last attempt to quit: 1996  . Years since quitting: 23.3  Smokeless Tobacco Never Used    Goals Met:  Independence with exercise equipment Improved SOB with ADL's Exercise tolerated well No report of cardiac concerns or symptoms Strength training completed today  Goals Unmet:  Not Applicable  Comments: Check out: 0915   Dr. Kate Sable is Medical Director for [place] and Pulmonary Rehab.

## 2017-09-20 ENCOUNTER — Encounter (HOSPITAL_COMMUNITY)
Admission: RE | Admit: 2017-09-20 | Discharge: 2017-09-20 | Disposition: A | Discharge status: Home / Self Care | Payer: Medicare Other | Source: Ambulatory Visit | Attending: Cardiology | Admitting: Cardiology

## 2017-09-20 DIAGNOSIS — I214 Non-ST elevation (NSTEMI) myocardial infarction: Secondary | ICD-10-CM | POA: Diagnosis not present

## 2017-09-20 DIAGNOSIS — Z955 Presence of coronary angioplasty implant and graft: Secondary | ICD-10-CM

## 2017-09-20 NOTE — Progress Notes (Signed)
Cardiac Individual Treatment Plan  Patient Details  Name: Paul Snyder MRN: [id number] Date of Birth: 1936-01-14 Referring Provider:     CARDIAC REHAB PHASE II ORIENTATION from 08/24/2017 in [place]  Referring Provider  Dr. [name]      Initial Encounter Date:    CARDIAC REHAB PHASE II ORIENTATION from 08/24/2017 in [place]  Date  08/24/17  Referring Provider  Dr. [name]      Visit Diagnosis: NSTEMI (non-ST elevated myocardial infarction) [facility])  Status post coronary artery stent placement  Patient's Home Medications on Admission:  Current Outpatient Medications:  .  albuterol (PROVENTIL HFA;VENTOLIN HFA) 108 (90 Base) MCG/ACT inhaler, Inhale 2 puffs into the lungs 4 (four) times daily. , Disp: , Rfl:  .  aspirin EC 81 MG EC tablet, Take 1 tablet (81 mg total) by mouth daily., Disp: , Rfl:  .  atorvastatin (LIPITOR) 80 MG tablet, Take 1 tablet (80 mg total) by mouth daily at 6 PM., Disp: 90 tablet, Rfl: 3 .  furosemide (LASIX) 20 MG tablet, Take 20 mg by mouth daily., Disp: , Rfl:  .  metoprolol tartrate (LOPRESSOR) 25 MG tablet, Take 0.5 tablets (12.5 mg total) by mouth 2 (two) times daily., Disp: 180 tablet, Rfl: 3 .  niacin 500 MG tablet, Take 500 mg by mouth daily. , Disp: , Rfl:  .  nitroGLYCERIN (NITROSTAT) 0.4 MG SL tablet, Place 1 tablet (0.4 mg total) under the tongue every 5 (five) minutes x 3 doses as needed for chest pain., Disp: 25 tablet, Rfl: 2 .  pantoprazole (PROTONIX) 40 MG tablet, Take 40 mg by mouth daily., Disp: , Rfl:  .  ticagrelor (BRILINTA) 90 MG TABS tablet, Take 1 tablet (90 mg total) by mouth 2 (two) times daily., Disp: 180 tablet, Rfl: 2  Past Medical History: Past Medical History:  Diagnosis Date  . CHF (congestive heart failure) ([place])   . COPD (chronic obstructive pulmonary disease) ([place])   . GERD (gastroesophageal reflux disease)   . Hyperlipidemia   . Lung cancer ([place]) 2009  . NSTEMI (non-ST  elevated myocardial infarction) ([place]) 07/16/2017   2/19 PCI/DES x1 to Lcx with moderate (50%) in the RCA, mild (20%) in LAD    Tobacco Use: Social History   Tobacco Use  Smoking Status Former Smoker  . Packs/day: 1.00  . Years: 42.00  . Pack years: 42.00  . Types: Cigarettes  . Last attempt to quit: 1996  . Years since quitting: 23.3  Smokeless Tobacco Never Used    Labs: Recent Chemical engineer    Labs for ITP Cardiac and Pulmonary Rehab Latest Ref Rng & Units 07/20/2017   Cholestrol 0 - 200 mg/dL 126   LDLCALC 0 - 99 mg/dL 71   HDL >40 mg/dL 32(L)   Trlycerides <150 mg/dL 117   Hemoglobin A1c 4.8 - 5.6 % 5.8(H)      Capillary Blood Glucose: Lab Results  Component Value Date   GLUCAP 94 01/21/2009     Exercise Target Goals:    Exercise Program Goal: Individual exercise prescription set using results from initial 6 min walk test and THRR while considering  patient's activity barriers and safety.   Exercise Prescription Goal: Starting with aerobic activity 30 plus minutes a day, 3 days per week for initial exercise prescription. Provide home exercise prescription and guidelines that participant acknowledges understanding prior to discharge.  Activity Barriers & Risk Stratification: Activity Barriers & Cardiac Risk Stratification - 08/24/17 1002  Activity Barriers & Cardiac Risk Stratification   Activity Barriers  Shortness of Breath;Deconditioning    Cardiac Risk Stratification  High       6 Minute Walk: 6 Minute Walk    Row Name 08/24/17 1002         6 Minute Walk   Phase  Initial     Distance  850 feet     Distance % Change  0 %     Distance Feet Change  0 ft     Walk Time  6 minutes     # of Rest Breaks  0     MPH  1.6     METS  2.23     RPE  12     Perceived Dyspnea   11     VO2 Peak  6.16     Symptoms  No     Resting HR  51 bpm     Resting BP  100/50     Resting Oxygen Saturation   95 %     Exercise Oxygen Saturation  during 6  min walk  93 %     Max Ex. HR  89 bpm     Max Ex. BP  128/58     2 Minute Post BP  106/52        Oxygen Initial Assessment:   Oxygen Re-Evaluation:   Oxygen Discharge (Final Oxygen Re-Evaluation):   Initial Exercise Prescription: Initial Exercise Prescription - 08/24/17 1000      Date of Initial Exercise RX and Referring Provider   Date  08/24/17    Referring Provider  Dr. [name]      Treadmill   MPH  0.8    Grade  0    Minutes  15    METs  1.6      NuStep   Level  1    SPM  56    Minutes  20    METs  1.6      Prescription Details   Frequency (times per week)  3    Duration  Progress to 30 minutes of continuous aerobic without signs/symptoms of physical distress      Intensity   THRR 40-80% of Max Heartrate  [phone]    Ratings of Perceived Exertion  11-13    Perceived Dyspnea  0-4      Progression   Progression  Continue progressive overload as per policy without signs/symptoms or physical distress.      Resistance Training   Training Prescription  Yes    Weight  1    Reps  10-15       Perform Capillary Blood Glucose checks as needed.  Exercise Prescription Changes:  Exercise Prescription Changes    Row Name 09/11/17 1500             Response to Exercise   Blood Pressure (Admit)  114/62       Blood Pressure (Exercise)  100/54       Blood Pressure (Exit)  114/50       Heart Rate (Admit)  77 bpm       Heart Rate (Exercise)  85 bpm       Heart Rate (Exit)  66 bpm       Rating of Perceived Exertion (Exercise)  10       Duration  Progress to 30 minutes of  aerobic without signs/symptoms of physical distress       Intensity  THRR New 102-115-127  Progression   Progression  Continue to progress workloads to maintain intensity without signs/symptoms of physical distress.         Resistance Training   Training Prescription  Yes       Weight  1       Reps  10-15         Treadmill   MPH  1       Grade  0       Minutes  15        METs  1.7         NuStep   Level  1       SPM  97       Minutes  20       METs  1.9          Exercise Comments:  Exercise Comments    Row Name 09/11/17 1507           Exercise Comments  Patient is doing very well in CR and has been very productive on the treadmill as well as the nustep machines. Patient has increased his speed on the treadmill to 1.0 and has increased his watts on the nustep to 97. Patient is still only on his eigth session and has been doing wel. He will be monitored throughout the remainder of the program.           Exercise Goals and Review:  Exercise Goals    Row Name 08/24/17 1004             Exercise Goals   Increase Physical Activity  Yes       Intervention  Develop an individualized exercise prescription for aerobic and resistive training based on initial evaluation findings, risk stratification, comorbidities and participant's personal goals.;Provide advice, education, support and counseling about physical activity/exercise needs.       Expected Outcomes  Short Term: Attend rehab on a regular basis to increase amount of physical activity.;Long Term: Exercising regularly at least 3-5 days a week.;Long Term: Add in home exercise to make exercise part of routine and to increase amount of physical activity.       Increase Strength and Stamina  Yes       Intervention  Develop an individualized exercise prescription for aerobic and resistive training based on initial evaluation findings, risk stratification, comorbidities and participant's personal goals.;Provide advice, education, support and counseling about physical activity/exercise needs.       Expected Outcomes  Short Term: Increase workloads from initial exercise prescription for resistance, speed, and METs.       Able to understand and use rate of perceived exertion (RPE) scale  Yes       Intervention  Provide education and explanation on how to use RPE scale       Expected Outcomes  Short Term:  Able to use RPE daily in rehab to express subjective intensity level;Long Term:  Able to use RPE to guide intensity level when exercising independently       Able to understand and use Dyspnea scale  Yes       Intervention  Provide education and explanation on how to use Dyspnea scale       Expected Outcomes  Short Term: Able to use Dyspnea scale daily in rehab to express subjective sense of shortness of breath during exertion;Long Term: Able to use Dyspnea scale to guide intensity level when exercising independently       Knowledge and understanding of Target Heart  Rate Range (THRR)  Yes       Intervention  Provide education and explanation of THRR including how the numbers were predicted and where they are located for reference       Expected Outcomes  Short Term: Able to state/look up THRR;Long Term: Able to use THRR to govern intensity when exercising independently;Short Term: Able to use daily as guideline for intensity in rehab       Able to check pulse independently  Yes       Intervention  Provide education and demonstration on how to check pulse in carotid and radial arteries.;Review the importance of being able to check your own pulse for safety during independent exercise       Expected Outcomes  Short Term: Able to explain why pulse checking is important during independent exercise;Long Term: Able to check pulse independently and accurately       Understanding of Exercise Prescription  Yes       Intervention  Provide education, explanation, and written materials on patient's individual exercise prescription       Expected Outcomes  Short Term: Able to explain program exercise prescription;Long Term: Able to explain home exercise prescription to exercise independently          Exercise Goals Re-Evaluation : Exercise Goals Re-Evaluation    Row Name 09/11/17 1505             Exercise Goal Re-Evaluation   Exercise Goals Review  Increase Physical Activity;Increase Strength and  Stamina;Able to understand and use Dyspnea scale;Able to check pulse independently;Able to understand and use rate of perceived exertion (RPE) scale;Knowledge and understanding of Target Heart Rate Range (THRR);Understanding of Exercise Prescription       Comments  Patient is doing very well in CR and has been very productive on the treadmill as well as the nustep machines. Patient has increased his speed on the treadmill to 1.0 and has increased his watts on the nustep to 97. Patient is still only on his eigth session and has been doing wel. He will be monitored throughout the remainder of the program.        Expected Outcomes  Patient wishes to become heart healthy and be able to breathe better.            Discharge Exercise Prescription (Final Exercise Prescription Changes): Exercise Prescription Changes - 09/11/17 1500      Response to Exercise   Blood Pressure (Admit)  114/62    Blood Pressure (Exercise)  100/54    Blood Pressure (Exit)  114/50    Heart Rate (Admit)  77 bpm    Heart Rate (Exercise)  85 bpm    Heart Rate (Exit)  66 bpm    Rating of Perceived Exertion (Exercise)  10    Duration  Progress to 30 minutes of  aerobic without signs/symptoms of physical distress    Intensity  THRR New 102-115-127      Progression   Progression  Continue to progress workloads to maintain intensity without signs/symptoms of physical distress.      Resistance Training   Training Prescription  Yes    Weight  1    Reps  10-15      Treadmill   MPH  1    Grade  0    Minutes  15    METs  1.7      NuStep   Level  1    SPM  97    Minutes  20  METs  1.9       Nutrition:  Target Goals: Understanding of nutrition guidelines, daily intake of sodium 1500mg , cholesterol 200mg , calories 30% from fat and 7% or less from saturated fats, daily to have 5 or more servings of fruits and vegetables.  Biometrics: Pre Biometrics - 08/24/17 1004      Pre Biometrics   Height  6' (1.829 m)     Weight  178 lb 3.2 oz (80.8 kg)    Waist Circumference  41.5 inches    Hip Circumference  39 inches    Waist to Hip Ratio  1.06 %    BMI (Calculated)  24.16    Triceps Skinfold  8 mm    % Body Fat  24.6 %    Grip Strength  61.26 kg    Flexibility  0 in    Single Leg Stand  1 seconds        Nutrition Therapy Plan and Nutrition Goals: Nutrition Therapy & Goals - 09/20/17 1301      Nutrition Therapy   RD appointment deferred  Yes      Personal Nutrition Goals   Personal Goal #2  Patient's family (son and daughter) are helping him with a heart healthy diet.     Additional Goals?  No       Nutrition Assessments:   Nutrition Goals Re-Evaluation:   Nutrition Goals Discharge (Final Nutrition Goals Re-Evaluation):   Psychosocial: Target Goals: Acknowledge presence or absence of significant depression and/or stress, maximize coping skills, provide positive support system. Participant is able to verbalize types and ability to use techniques and skills needed for reducing stress and depression.  Initial Review & Psychosocial Screening: Initial Psych Review & Screening - 08/24/17 1020      Initial Review   Current issues with  None Identified      Family Dynamics   Good Support System?  Yes      Barriers   Psychosocial barriers to participate in program  There are no identifiable barriers or psychosocial needs.      Screening Interventions   Interventions  Encouraged to exercise    Expected Outcomes  Short Term goal: Identification and review with participant of any Quality of Life or Depression concerns found by scoring the questionnaire.;Long Term goal: The participant improves quality of Life and PHQ9 Scores as seen by post scores and/or verbalization of changes       Quality of Life Scores: Quality of Life - 08/24/17 1005      Quality of Life Scores   Health/Function Pre  22.83 %    Socioeconomic Pre  23.88 %    Psych/Spiritual Pre  29.14 %    Family Pre  28.8 %     GLOBAL Pre  25.19 %      Scores of 19 and below usually indicate a poorer quality of life in these areas.  A difference of  2-3 points is a clinically meaningful difference.  A difference of 2-3 points in the total score of the Quality of Life Index has been associated with significant improvement in overall quality of life, self-image, physical symptoms, and general health in studies assessing change in quality of life.  PHQ-9: Recent Review Flowsheet Data    Depression screen [facility] 2/9 08/24/2017   Decreased Interest 0   Down, Depressed, Hopeless 0   PHQ - 2 Score 0   Altered sleeping 0   Tired, decreased energy 0   Change in appetite 0  Feeling bad or failure about yourself  0   Trouble concentrating 0   Moving slowly or fidgety/restless 0   Suicidal thoughts 0   PHQ-9 Score 0   Difficult doing work/chores Not difficult at all     Interpretation of Total Score  Total Score Depression Severity:  1-4 = Minimal depression, 5-9 = Mild depression, 10-14 = Moderate depression, 15-19 = Moderately severe depression, 20-27 = Severe depression   Psychosocial Evaluation and Intervention: Psychosocial Evaluation - 08/24/17 1021      Psychosocial Evaluation & Interventions   Interventions  Encouraged to exercise with the program and follow exercise prescription    Continue Psychosocial Services   No Follow up required       Psychosocial Re-Evaluation: Psychosocial Re-Evaluation    [place] Name 09/20/17 1304             Psychosocial Re-Evaluation   Current issues with  None Identified       Comments  Patient's initial QOL score was 25.19 and his PHQ-9 score was 0 with no psychosocial issues identified.        Expected Outcomes  Patient will have no psychosocial issues identified at discharge.        Interventions  Stress management education;Encouraged to attend Cardiac Rehabilitation for the exercise;Relaxation education       Continue Psychosocial Services   No Follow up required           Psychosocial Discharge (Final Psychosocial Re-Evaluation): Psychosocial Re-Evaluation - 09/20/17 1304      Psychosocial Re-Evaluation   Current issues with  None Identified    Comments  Patient's initial QOL score was 25.19 and his PHQ-9 score was 0 with no psychosocial issues identified.     Expected Outcomes  Patient will have no psychosocial issues identified at discharge.     Interventions  Stress management education;Encouraged to attend Cardiac Rehabilitation for the exercise;Relaxation education    Continue Psychosocial Services   No Follow up required       Vocational Rehabilitation: Provide vocational rehab assistance to qualifying candidates.   Vocational Rehab Evaluation & Intervention: Vocational Rehab - 08/24/17 1014      Initial Vocational Rehab Evaluation & Intervention   Assessment shows need for Vocational Rehabilitation  No       Education: Education Goals: Education classes will be provided on a weekly basis, covering required topics. Participant will state understanding/return demonstration of topics presented.  Learning Barriers/Preferences: Learning Barriers/Preferences - 08/24/17 1012      Learning Barriers/Preferences   Learning Barriers  Hearing    Learning Preferences  Video;Written Material;Pictoral;Computer/Internet       Education Topics: Hypertension, Hypertension Reduction -Define heart disease and high blood pressure. Discus how high blood pressure affects the body and ways to reduce high blood pressure.   Exercise and Your Heart -Discuss why it is important to exercise, the FITT principles of exercise, normal and abnormal responses to exercise, and how to exercise safely.   Angina -Discuss definition of angina, causes of angina, treatment of angina, and how to decrease risk of having angina.   Cardiac Medications -Review what the following cardiac medications are used for, how they affect the body, and side effects that may  occur when taking the medications.  Medications include Aspirin, Beta blockers, calcium channel blockers, ACE Inhibitors, angiotensin receptor blockers, diuretics, digoxin, and antihyperlipidemics.   Congestive Heart Failure -Discuss the definition of CHF, how to live with CHF, the signs and symptoms of CHF, and how keep  track of weight and sodium intake.   Heart Disease and Intimacy -Discus the effect sexual activity has on the heart, how changes occur during intimacy as we age, and safety during sexual activity.   CARDIAC REHAB PHASE II EXERCISE from 09/13/2017 in [place]  Date  08/30/17  Educator  DJ  Instruction Review Code  2- Demonstrated Understanding      Smoking Cessation / COPD -Discuss different methods to quit smoking, the health benefits of quitting smoking, and the definition of COPD.   Nutrition I: Fats -Discuss the types of cholesterol, what cholesterol does to the heart, and how cholesterol levels can be controlled.   Nutrition II: Labels -Discuss the different components of food labels and how to read food label   Heart Parts/Heart Disease and PAD -Discuss the anatomy of the heart, the pathway of blood circulation through the heart, and these are affected by heart disease.   Stress I: Signs and Symptoms -Discuss the causes of stress, how stress may lead to anxiety and depression, and ways to limit stress.   CARDIAC REHAB PHASE II EXERCISE from 09/13/2017 in [place]  Date  09/06/17  Educator  [place]  Instruction Review Code  2- Demonstrated Understanding      Stress II: Relaxation -Discuss different types of relaxation techniques to limit stress.   CARDIAC REHAB PHASE II EXERCISE from 09/13/2017 in [place]  Date  09/13/17  Educator  DJ  Instruction Review Code  2- Demonstrated Understanding      Warning Signs of Stroke / TIA -Discuss definition of a stroke, what the signs and  symptoms are of a stroke, and how to identify when someone is having stroke.   Knowledge Questionnaire Score: Knowledge Questionnaire Score - 08/24/17 1014      Knowledge Questionnaire Score   Pre Score  20/24       Core Components/Risk Factors/Patient Goals at Admission: Personal Goals and Risk Factors at Admission - 08/24/17 1018      Core Components/Risk Factors/Patient Goals on Admission    Weight Management  Weight Maintenance    Personal Goal Other  Yes    Personal Goal  Become more heart healthy and breathe better.     Intervention  Attend CR 3 x week and supplement with home exercise 2 x week.    Expected Outcomes  Reach personal goals       Core Components/Risk Factors/Patient Goals Review:  Goals and Risk Factor Review    Row Name 09/20/17 1301             Core Components/Risk Factors/Patient Goals Review   Personal Goals Review  Weight Management/Obesity Breathe better; be heart healthy.        Review  Patient has completed 12 sessions gaining 2.5 lbs since he started the program. He is doing well in the program with some progression. He says he feels the program has helped him so far with increased energy. He has not seen an improvement in his breathing. He hopes to see more improvement at he continues. Will continue to monitor for progress.        Expected Outcomes  Patient will continue to attend sessions and complete the program meeting his personal goals.           Core Components/Risk Factors/Patient Goals at Discharge (Final Review):  Goals and Risk Factor Review - 09/20/17 1301      Core Components/Risk Factors/Patient Goals Review   Personal Goals  Review  Weight Management/Obesity Breathe better; be heart healthy.     Review  Patient has completed 12 sessions gaining 2.5 lbs since he started the program. He is doing well in the program with some progression. He says he feels the program has helped him so far with increased energy. He has not seen an  improvement in his breathing. He hopes to see more improvement at he continues. Will continue to monitor for progress.     Expected Outcomes  Patient will continue to attend sessions and complete the program meeting his personal goals.        ITP Comments: ITP Comments    Row Name 08/24/17 1016 08/24/17 1046 08/24/17 1247       ITP Comments  Patient is been referred to Cardiac Rehab by Dr. Irish Lack due to NSTEMI and Stent placement.   Patient is beening referred to Cardiac Rehab by Dr. Irish Lack due to NSTEMI and Stent placement.   Patient new to program. Plans to start Monday 08/28/17. Will continue to monitor for progress.         Comments: ITP 30 Day REVIEW Patient doing well in the program. Will continue to monitor for progress.

## 2017-09-20 NOTE — Progress Notes (Signed)
Daily Session Note  Patient Details  Name: Paul Snyder MRN: [id number] Date of Birth: [birth year] Referring Provider:     CARDIAC REHAB PHASE II ORIENTATION from 08/24/2017 in [place]  Referring Provider  Dr. [name]      Encounter Date: 09/20/2017  Check In: Session Check In - 09/20/17 0844      Check-In   Location  [facility]    Staff Present  Diane Angelina Pih, MS, EP, [facility], Exercise Physiologist;Yardley Beltran Luther Parody, BS, EP, Exercise Physiologist;Debra Wynetta Emery, RN, BSN    Supervising physician immediately available to respond to emergencies  See telemetry face sheet for immediately available MD    Medication changes reported      No    Fall or balance concerns reported     No    Warm-up and Cool-down  Performed as group-led instruction    Resistance Training Performed  Yes    VAD Patient?  No      Pain Assessment   Currently in Pain?  No/denies    Pain Score  0-No pain    Multiple Pain Sites  No       Capillary Blood Glucose: No results found for this or any previous visit (from the past 24 hour(s)).    Social History   Tobacco Use  Smoking Status Former Smoker  . Packs/day: 1.00  . Years: 42.00  . Pack years: 42.00  . Types: Cigarettes  . Last attempt to quit: 1996  . Years since quitting: 23.3  Smokeless Tobacco Never Used    Goals Met:  Independence with exercise equipment Exercise tolerated well No report of cardiac concerns or symptoms Strength training completed today  Goals Unmet:  Not Applicable  Comments: Check out 915   Dr. Kate Sable is Medical Director for [place] and Pulmonary Rehab.

## 2017-09-22 ENCOUNTER — Encounter (HOSPITAL_COMMUNITY)
Admission: RE | Admit: 2017-09-22 | Discharge: 2017-09-22 | Disposition: A | Discharge status: Home / Self Care | Payer: Medicare Other | Source: Ambulatory Visit | Attending: Cardiology | Admitting: Cardiology

## 2017-09-22 DIAGNOSIS — I214 Non-ST elevation (NSTEMI) myocardial infarction: Secondary | ICD-10-CM | POA: Diagnosis not present

## 2017-09-22 NOTE — Progress Notes (Signed)
Daily Session Note  Patient Details  Name: Paul Snyder MRN: [id number] Date of Birth: 01/16/1936 Referring Provider:     CARDIAC REHAB PHASE II ORIENTATION from 08/24/2017 in [place]  Referring Provider  Dr. [name]      Encounter Date: 09/22/2017  Check In: Session Check In - 09/22/17 0815      Check-In   Location  [facility]    Staff Present  Russella Dar, MS, EP, [facility], Exercise Physiologist;Gregory Luther Parody, BS, EP, Exercise Physiologist    Supervising physician immediately available to respond to emergencies  See telemetry face sheet for immediately available MD    Medication changes reported      No    Fall or balance concerns reported     No    Warm-up and Cool-down  Performed as group-led instruction    Resistance Training Performed  Yes    VAD Patient?  No      Pain Assessment   Currently in Pain?  No/denies    Pain Score  0-No pain    Multiple Pain Sites  No       Capillary Blood Glucose: No results found for this or any previous visit (from the past 24 hour(s)).    Social History   Tobacco Use  Smoking Status Former Smoker  . Packs/day: 1.00  . Years: 42.00  . Pack years: 42.00  . Types: Cigarettes  . Last attempt to quit: 1996  . Years since quitting: 23.3  Smokeless Tobacco Never Used    Goals Met:  Independence with exercise equipment Exercise tolerated well No report of cardiac concerns or symptoms Strength training completed today  Goals Unmet:  Not Applicable  Comments: Check out: 0915   Dr. Kate Sable is Medical Director for [place] and Pulmonary Rehab.

## 2017-09-25 ENCOUNTER — Encounter (HOSPITAL_COMMUNITY)
Admission: RE | Admit: 2017-09-25 | Discharge: 2017-09-25 | Disposition: A | Discharge status: Home / Self Care | Payer: Medicare Other | Source: Ambulatory Visit | Attending: Cardiology | Admitting: Cardiology

## 2017-09-25 DIAGNOSIS — I214 Non-ST elevation (NSTEMI) myocardial infarction: Secondary | ICD-10-CM | POA: Diagnosis not present

## 2017-09-25 DIAGNOSIS — Z955 Presence of coronary angioplasty implant and graft: Secondary | ICD-10-CM

## 2017-09-25 NOTE — Progress Notes (Signed)
Daily Session Note  Patient Details  Name: Cougar J Wymer MRN: [id number] Date of Birth: 05/06/1936 Referring Provider:     CARDIAC REHAB PHASE II ORIENTATION from 08/24/2017 in [place] CARDI[facility] REHABILITATION  Referring Provider  Dr. Branch      Encounter Date: 09/25/2017  Check In: Session Check In - 09/25/17 0815      Check-In   Location  [facility]    Staff Present  Diane Coad, MS, EP, [facility], Exercise Physiologist;Gregory Cowan, BS, EP, Exercise Physiologist;Debra Johnson, RN, BSN    Supervising physician immediately available to respond to emergencies  See telemetry face sheet for immediately available MD    Medication changes reported      No    Fall or balance concerns reported     No    Warm-up and Cool-down  Performed as group-led instruction    Resistance Training Performed  Yes    VAD Patient?  No      Pain Assessment   Currently in Pain?  No/denies    Pain Score  0-No pain    Multiple Pain Sites  No       Capillary Blood Glucose: No results found for this or any previous visit (from the past 24 hour(s)).    Social History   Tobacco Use  Smoking Status Former Smoker  . Packs/day: 1.00  . Years: 42.00  . Pack years: 42.00  . Types: Cigarettes  . Last attempt to quit: 1996  . Years since quitting: 23.3  Smokeless Tobacco Never Used    Goals Met:  Independence with exercise equipment Exercise tolerated well No report of cardiac concerns or symptoms Strength training completed today  Goals Unmet:  Not Applicable  Comments: Check out 915.   Dr. Suresh Koneswaran is Medical Director for [place] [facility] and [facility]. 

## 2017-09-27 ENCOUNTER — Encounter (HOSPITAL_COMMUNITY)
Admission: RE | Admit: 2017-09-27 | Discharge: 2017-09-27 | Disposition: A | Discharge status: Home / Self Care | Payer: Medicare Other | Source: Ambulatory Visit | Attending: Cardiology | Admitting: Cardiology

## 2017-09-27 DIAGNOSIS — I214 Non-ST elevation (NSTEMI) myocardial infarction: Secondary | ICD-10-CM | POA: Diagnosis not present

## 2017-09-27 NOTE — Progress Notes (Signed)
Daily Session Note  Patient Details  Name: Paul Snyder MRN: [id number] Date of Birth: 1935-07-04 Referring Provider:     CARDIAC REHAB PHASE II ORIENTATION from 08/24/2017 in [place]  Referring Provider  Dr. [name]      Encounter Date: 09/27/2017  Check In: Session Check In - 09/27/17 0815      Check-In   Location  [facility]    Staff Present  Russella Dar, MS, EP, [facility], Exercise Physiologist;Gregory Luther Parody, BS, EP, Exercise Physiologist    Supervising physician immediately available to respond to emergencies  See telemetry face sheet for immediately available MD    Medication changes reported      No    Fall or balance concerns reported     No    Warm-up and Cool-down  Performed as group-led instruction    Resistance Training Performed  Yes    VAD Patient?  No      Pain Assessment   Currently in Pain?  No/denies    Pain Score  0-No pain    Multiple Pain Sites  No       Capillary Blood Glucose: No results found for this or any previous visit (from the past 24 hour(s)).    Social History   Tobacco Use  Smoking Status Former Smoker  . Packs/day: 1.00  . Years: 42.00  . Pack years: 42.00  . Types: Cigarettes  . Last attempt to quit: 1996  . Years since quitting: 23.3  Smokeless Tobacco Never Used    Goals Met:  Independence with exercise equipment Exercise tolerated well No report of cardiac concerns or symptoms Strength training completed today  Goals Unmet:  Not Applicable  Comments: Check out: 0915   Dr. Kate Sable is Medical Director for [place] and Pulmonary Rehab.

## 2017-09-29 ENCOUNTER — Encounter (HOSPITAL_COMMUNITY)
Admission: RE | Admit: 2017-09-29 | Discharge: 2017-09-29 | Disposition: A | Discharge status: Home / Self Care | Payer: Medicare Other | Source: Ambulatory Visit | Attending: Cardiology | Admitting: Cardiology

## 2017-09-29 DIAGNOSIS — Z955 Presence of coronary angioplasty implant and graft: Secondary | ICD-10-CM

## 2017-09-29 DIAGNOSIS — I214 Non-ST elevation (NSTEMI) myocardial infarction: Secondary | ICD-10-CM

## 2017-09-29 NOTE — Progress Notes (Signed)
Daily Session Note  Patient Details  Name: Paul Snyder MRN: [id number] Date of Birth: 03/08/1936 Referring Provider:     CARDIAC REHAB PHASE II ORIENTATION from 08/24/2017 in [place]  Referring Provider  Dr. [name]      Encounter Date: 09/29/2017  Check In: Session Check In - 09/29/17 0847      Check-In   Location  [facility]    Staff Present  Diane Angelina Pih, MS, EP, [facility], Exercise Physiologist;Kailynn Satterly Luther Parody, BS, EP, Exercise Physiologist;Debra Wynetta Emery, RN, BSN    Supervising physician immediately available to respond to emergencies  See telemetry face sheet for immediately available MD    Medication changes reported      No    Fall or balance concerns reported     No    Warm-up and Cool-down  Performed as group-led instruction    Resistance Training Performed  Yes    VAD Patient?  No      Pain Assessment   Currently in Pain?  No/denies    Pain Score  0-No pain    Multiple Pain Sites  No       Capillary Blood Glucose: No results found for this or any previous visit (from the past 24 hour(s)).    Social History   Tobacco Use  Smoking Status Former Smoker  . Packs/day: 1.00  . Years: 42.00  . Pack years: 42.00  . Types: Cigarettes  . Last attempt to quit: 1996  . Years since quitting: 23.3  Smokeless Tobacco Never Used    Goals Met:  Independence with exercise equipment Exercise tolerated well No report of cardiac concerns or symptoms Strength training completed today  Goals Unmet:  Not Applicable  Comments: Check out 915   Dr. Kate Sable is Medical Director for [place] and Pulmonary Rehab.

## 2017-10-02 ENCOUNTER — Encounter (HOSPITAL_COMMUNITY)
Admission: RE | Admit: 2017-10-02 | Discharge: 2017-10-02 | Disposition: A | Discharge status: Home / Self Care | Payer: Medicare Other | Source: Ambulatory Visit | Attending: Cardiology | Admitting: Cardiology

## 2017-10-02 DIAGNOSIS — I214 Non-ST elevation (NSTEMI) myocardial infarction: Secondary | ICD-10-CM | POA: Diagnosis not present

## 2017-10-02 DIAGNOSIS — Z955 Presence of coronary angioplasty implant and graft: Secondary | ICD-10-CM

## 2017-10-02 NOTE — Progress Notes (Signed)
Daily Session Note  Patient Details  Name: Paul Snyder MRN: [id number] Date of Birth: 10/17/1935 Referring Provider:     CARDIAC REHAB PHASE II ORIENTATION from 08/24/2017 in [place]  Referring Provider  Dr. [name]      Encounter Date: 10/02/2017  Check In: Session Check In - 10/02/17 0815      Check-In   Location  [facility]    Staff Present  Diane Angelina Pih, MS, EP, [facility], Exercise Physiologist;Gregory Luther Parody, BS, EP, Exercise Physiologist;Ashtin Melichar Wynetta Emery, RN, BSN    Supervising physician immediately available to respond to emergencies  See telemetry face sheet for immediately available MD    Medication changes reported      No    Fall or balance concerns reported     No    Warm-up and Cool-down  Performed as group-led instruction    Resistance Training Performed  Yes    VAD Patient?  No      Pain Assessment   Currently in Pain?  No/denies    Pain Score  0-No pain    Multiple Pain Sites  No       Capillary Blood Glucose: No results found for this or any previous visit (from the past 24 hour(s)).    Social History   Tobacco Use  Smoking Status Former Smoker  . Packs/day: 1.00  . Years: 42.00  . Pack years: 42.00  . Types: Cigarettes  . Last attempt to quit: 1996  . Years since quitting: 23.3  Smokeless Tobacco Never Used    Goals Met:  Independence with exercise equipment Exercise tolerated well No report of cardiac concerns or symptoms Strength training completed today  Goals Unmet:  Not Applicable  Comments: Check out 915.   Dr. Kate Sable is Medical Director for [facility] and Pulmonary Rehab.

## 2017-10-04 ENCOUNTER — Encounter (HOSPITAL_COMMUNITY)
Admission: RE | Admit: 2017-10-04 | Discharge: 2017-10-04 | Disposition: A | Discharge status: Home / Self Care | Payer: Medicare Other | Source: Ambulatory Visit | Attending: Cardiology | Admitting: Cardiology

## 2017-10-04 DIAGNOSIS — Z955 Presence of coronary angioplasty implant and graft: Secondary | ICD-10-CM

## 2017-10-04 DIAGNOSIS — I214 Non-ST elevation (NSTEMI) myocardial infarction: Secondary | ICD-10-CM | POA: Diagnosis not present

## 2017-10-04 NOTE — Progress Notes (Signed)
Daily Session Note  Patient Details  Name: Paul Snyder MRN: [id number] Date of Birth: 05-11-1936 Referring Provider:     CARDIAC REHAB PHASE II ORIENTATION from 08/24/2017 in [place]  Referring Provider  Dr. [name]      Encounter Date: 10/04/2017  Check In: Session Check In - 10/04/17 0815      Check-In   Location  [facility]    Staff Present  Suzanne Boron, BS, EP, Exercise Physiologist;Karalyn Kadel Wynetta Emery, RN, BSN    Supervising physician immediately available to respond to emergencies  See telemetry face sheet for immediately available MD    Medication changes reported      No    Fall or balance concerns reported     No    Warm-up and Cool-down  Performed as group-led instruction    Resistance Training Performed  Yes    VAD Patient?  No      Pain Assessment   Currently in Pain?  No/denies    Pain Score  0-No pain    Multiple Pain Sites  No       Capillary Blood Glucose: No results found for this or any previous visit (from the past 24 hour(s)).    Social History   Tobacco Use  Smoking Status Former Smoker  . Packs/day: 1.00  . Years: 42.00  . Pack years: 42.00  . Types: Cigarettes  . Last attempt to quit: 1996  . Years since quitting: 23.3  Smokeless Tobacco Never Used    Goals Met:  Independence with exercise equipment Exercise tolerated well No report of cardiac concerns or symptoms Strength training completed today  Goals Unmet:  Not Applicable  Comments: Check out 915.   Dr. Kate Sable is Medical Director for [facility] Cardiac and Pulmonary Rehab.

## 2017-10-06 ENCOUNTER — Encounter (HOSPITAL_COMMUNITY)
Admission: RE | Admit: 2017-10-06 | Discharge: 2017-10-06 | Disposition: A | Discharge status: Home / Self Care | Payer: Medicare Other | Source: Ambulatory Visit | Attending: Cardiology | Admitting: Cardiology

## 2017-10-06 DIAGNOSIS — Z955 Presence of coronary angioplasty implant and graft: Secondary | ICD-10-CM

## 2017-10-06 DIAGNOSIS — I214 Non-ST elevation (NSTEMI) myocardial infarction: Secondary | ICD-10-CM | POA: Diagnosis not present

## 2017-10-06 NOTE — Progress Notes (Signed)
Daily Session Note  Patient Details  Name: JESSUP OGAS MRN: [id number] Date of Birth: 06-Dec-1935 Referring Provider:     CARDIAC REHAB PHASE II ORIENTATION from 08/24/2017 in [place]  Referring Provider  Dr. [name]      Encounter Date: 10/06/2017  Check In: Session Check In - 10/06/17 0850      Check-In   Location  [facility]    Staff Present  Suzanne Boron, BS, EP, Exercise Physiologist;Debra Wynetta Emery, RN, BSN    Supervising physician immediately available to respond to emergencies  See telemetry face sheet for immediately available MD    Medication changes reported      No    Fall or balance concerns reported     No    Warm-up and Cool-down  Performed as group-led instruction    Resistance Training Performed  Yes    VAD Patient?  No      Pain Assessment   Currently in Pain?  No/denies    Pain Score  0-No pain    Multiple Pain Sites  No       Capillary Blood Glucose: No results found for this or any previous visit (from the past 24 hour(s)).    Social History   Tobacco Use  Smoking Status Former Smoker  . Packs/day: 1.00  . Years: 42.00  . Pack years: 42.00  . Types: Cigarettes  . Last attempt to quit: 1996  . Years since quitting: 23.3  Smokeless Tobacco Never Used    Goals Met:  Independence with exercise equipment Exercise tolerated well No report of cardiac concerns or symptoms Strength training completed today  Goals Unmet:  Not Applicable  Comments: Check out 915   Dr. Kate Sable is Medical Director for [place] and Pulmonary Rehab.

## 2017-10-09 ENCOUNTER — Encounter (HOSPITAL_COMMUNITY)
Admission: RE | Admit: 2017-10-09 | Discharge: 2017-10-09 | Disposition: A | Discharge status: Home / Self Care | Payer: Medicare Other | Source: Ambulatory Visit | Attending: Cardiology | Admitting: Cardiology

## 2017-10-09 DIAGNOSIS — Z955 Presence of coronary angioplasty implant and graft: Secondary | ICD-10-CM

## 2017-10-09 DIAGNOSIS — I214 Non-ST elevation (NSTEMI) myocardial infarction: Secondary | ICD-10-CM | POA: Diagnosis not present

## 2017-10-09 NOTE — Progress Notes (Signed)
Daily Session Note  Patient Details  Name: Paul Snyder MRN: [id number] Date of Birth: 02-19-1936 Referring Provider:     CARDIAC REHAB PHASE II ORIENTATION from 08/24/2017 in [place]  Referring Provider  Dr. [name]      Encounter Date: 10/09/2017  Check In: Session Check In - 10/09/17 0815      Check-In   Location  [facility]    Staff Present  Suzanne Boron, BS, EP, Exercise Physiologist;Julian Medina Wynetta Emery, RN, BSN;Diane Coad, MS, EP, [facility], Exercise Physiologist    Supervising physician immediately available to respond to emergencies  See telemetry face sheet for immediately available MD    Medication changes reported      No    Fall or balance concerns reported     No    Warm-up and Cool-down  Performed as group-led instruction    Resistance Training Performed  Yes    VAD Patient?  No      Pain Assessment   Currently in Pain?  No/denies    Pain Score  0-No pain    Multiple Pain Sites  No       Capillary Blood Glucose: No results found for this or any previous visit (from the past 24 hour(s)).    Social History   Tobacco Use  Smoking Status Former Smoker  . Packs/day: 1.00  . Years: 42.00  . Pack years: 42.00  . Types: Cigarettes  . Last attempt to quit: 1996  . Years since quitting: 23.3  Smokeless Tobacco Never Used    Goals Met:  Independence with exercise equipment Exercise tolerated well No report of cardiac concerns or symptoms Strength training completed today  Goals Unmet:  Not Applicable  Comments: Check out 915.   Dr. Kate Sable is Medical Director for [facility] Card[facility] and Pulmonary Rehab.

## 2017-10-09 NOTE — Progress Notes (Signed)
Cardiac Individual Treatment Plan  Patient Details  Name: Paul Snyder MRN: [id number] Date of Birth: Apr 25, 1936 Referring Provider:     CARDIAC REHAB PHASE II ORIENTATION from 08/24/2017 in [place]  Referring Provider  Dr. [name]      Initial Encounter Date:    CARDIAC REHAB PHASE II ORIENTATION from 08/24/2017 in [place]  Date  08/24/17  Referring Provider  Dr. [name]      Visit Diagnosis: NSTEMI (non-ST elevated myocardial infarction) [facility])  Status post coronary artery stent placement  Patient's Home Medications on Admission:  Current Outpatient Medications:  .  albuterol (PROVENTIL HFA;VENTOLIN HFA) 108 (90 Base) MCG/ACT inhaler, Inhale 2 puffs into the lungs 4 (four) times daily. , Disp: , Rfl:  .  aspirin EC 81 MG EC tablet, Take 1 tablet (81 mg total) by mouth daily., Disp: , Rfl:  .  atorvastatin (LIPITOR) 80 MG tablet, Take 1 tablet (80 mg total) by mouth daily at 6 PM., Disp: 90 tablet, Rfl: 3 .  furosemide (LASIX) 20 MG tablet, Take 20 mg by mouth daily., Disp: , Rfl:  .  metoprolol tartrate (LOPRESSOR) 25 MG tablet, Take 0.5 tablets (12.5 mg total) by mouth 2 (two) times daily., Disp: 180 tablet, Rfl: 3 .  niacin 500 MG tablet, Take 500 mg by mouth daily. , Disp: , Rfl:  .  nitroGLYCERIN (NITROSTAT) 0.4 MG SL tablet, Place 1 tablet (0.4 mg total) under the tongue every 5 (five) minutes x 3 doses as needed for chest pain., Disp: 25 tablet, Rfl: 2 .  pantoprazole (PROTONIX) 40 MG tablet, Take 40 mg by mouth daily., Disp: , Rfl:  .  ticagrelor (BRILINTA) 90 MG TABS tablet, Take 1 tablet (90 mg total) by mouth 2 (two) times daily., Disp: 180 tablet, Rfl: 2  Past Medical History: Past Medical History:  Diagnosis Date  . CHF (congestive heart failure) ([place])   . COPD (chronic obstructive pulmonary disease) ([place])   . GERD (gastroesophageal reflux disease)   . Hyperlipidemia   . Lung cancer ([place]) 2009  . NSTEMI (non-ST  elevated myocardial infarction) ([place]) 07/16/2017   2/19 PCI/DES x1 to Lcx with moderate (50%) in the RCA, mild (20%) in LAD    Tobacco Use: Social History   Tobacco Use  Smoking Status Former Smoker  . Packs/day: 1.00  . Years: 42.00  . Pack years: 42.00  . Types: Cigarettes  . Last attempt to quit: 1996  . Years since quitting: 23.3  Smokeless Tobacco Never Used    Labs: Recent Chemical engineer    Labs for ITP Cardiac and Pulmonary Rehab Latest Ref Rng & Units 07/20/2017   Cholestrol 0 - 200 mg/dL 126   LDLCALC 0 - 99 mg/dL 71   HDL >40 mg/dL 32(L)   Trlycerides <150 mg/dL 117   Hemoglobin A1c 4.8 - 5.6 % 5.8(H)      Capillary Blood Glucose: Lab Results  Component Value Date   GLUCAP 94 01/21/2009     Exercise Target Goals:    Exercise Program Goal: Individual exercise prescription set using results from initial 6 min walk test and THRR while considering  patient's activity barriers and safety.   Exercise Prescription Goal: Starting with aerobic activity 30 plus minutes a day, 3 days per week for initial exercise prescription. Provide home exercise prescription and guidelines that participant acknowledges understanding prior to discharge.  Activity Barriers & Risk Stratification: Activity Barriers & Cardiac Risk Stratification - 08/24/17 1002  Activity Barriers & Cardiac Risk Stratification   Activity Barriers  Shortness of Breath;Deconditioning    Cardiac Risk Stratification  High       6 Minute Walk: 6 Minute Walk    Row Name 08/24/17 1002         6 Minute Walk   Phase  Initial     Distance  850 feet     Distance % Change  0 %     Distance Feet Change  0 ft     Walk Time  6 minutes     # of Rest Breaks  0     MPH  1.6     METS  2.23     RPE  12     Perceived Dyspnea   11     VO2 Peak  6.16     Symptoms  No     Resting HR  51 bpm     Resting BP  100/50     Resting Oxygen Saturation   95 %     Exercise Oxygen Saturation  during 6  min walk  93 %     Max Ex. HR  89 bpm     Max Ex. BP  128/58     2 Minute Post BP  106/52        Oxygen Initial Assessment:   Oxygen Re-Evaluation:   Oxygen Discharge (Final Oxygen Re-Evaluation):   Initial Exercise Prescription: Initial Exercise Prescription - 08/24/17 1000      Date of Initial Exercise RX and Referring Provider   Date  08/24/17    Referring Provider  Dr. [name]      Treadmill   MPH  0.8    Grade  0    Minutes  15    METs  1.6      NuStep   Level  1    SPM  56    Minutes  20    METs  1.6      Prescription Details   Frequency (times per week)  3    Duration  Progress to 30 minutes of continuous aerobic without signs/symptoms of physical distress      Intensity   THRR 40-80% of Max Heartrate  [phone]    Ratings of Perceived Exertion  11-13    Perceived Dyspnea  0-4      Progression   Progression  Continue progressive overload as per policy without signs/symptoms or physical distress.      Resistance Training   Training Prescription  Yes    Weight  1    Reps  10-15       Perform Capillary Blood Glucose checks as needed.  Exercise Prescription Changes:  Exercise Prescription Changes    Row Name 09/11/17 1500 09/26/17 0700 10/06/17 1400         Response to Exercise   Blood Pressure (Admit)  114/62  120/50  124/72     Blood Pressure (Exercise)  100/54  104/50  128/72     Blood Pressure (Exit)  114/50  110/50  110/50     Heart Rate (Admit)  77 bpm  55 bpm  46 bpm     Heart Rate (Exercise)  85 bpm  65 bpm  67 bpm     Heart Rate (Exit)  66 bpm  59 bpm  55 bpm     Rating of Perceived Exertion (Exercise)  10  10  10      Duration  Progress to 30 minutes  of  aerobic without signs/symptoms of physical distress  Progress to 30 minutes of  aerobic without signs/symptoms of physical distress  Progress to 30 minutes of  aerobic without signs/symptoms of physical distress     Intensity  THRR New 102-115-127  THRR New 89-106-123  THRR New  84-102-121       Progression   Progression  Continue to progress workloads to maintain intensity without signs/symptoms of physical distress.  Continue to progress workloads to maintain intensity without signs/symptoms of physical distress.  Continue to progress workloads to maintain intensity without signs/symptoms of physical distress.       Resistance Training   Training Prescription  Yes  Yes  Yes     Weight  1  2  2      Reps  10-15  10-15  10-15       Treadmill   MPH  1  1.1  1.2     Grade  0  0  0     Minutes  15  15  15      METs  1.7  1.8  1.9       NuStep   Level  1  2  2      SPM  97  89  98     Minutes  20  20  20      METs  1.9  1.8  1.8       Home Exercise Plan   Plans to continue exercise at  -  Home (comment)  Home (comment)     Frequency  -  Add 2 additional days to program exercise sessions.  Add 2 additional days to program exercise sessions.     Initial Home Exercises Provided  -  09/25/17  09/25/17        Exercise Comments:  Exercise Comments    Row Name 09/11/17 1507 09/26/17 0744 10/06/17 1459       Exercise Comments  Patient is doing very well in CR and has been very productive on the treadmill as well as the nustep machines. Patient has increased his speed on the treadmill to 1.0 and has increased his watts on the nustep to 97. Patient is still only on his eigth session and has been doing wel. He will be monitored throughout the remainder of the program.   Patient is doing very well in CR. Patient has increased his level on the nustep machine and as well his speed on the treadmill. Patient has had no complaints since starting the program and seems to be enjoying the company of others. Patient does not complain of SOB while on his machines which was one of his goals from the beginning of the program.   Patient continues to do well in CR. Patient has increased his speed on the treadmill to 1.2. Patient has also increased his SPMs on the nustep to 98. Patient has  increased his distance to about a mile each time on the nustep as well. Patient stated that he feels stronger since beginning the program and it is evident through his progressions that it is true.         Exercise Goals and Review:  Exercise Goals    Row Name 08/24/17 1004             Exercise Goals   Increase Physical Activity  Yes       Intervention  Develop an individualized exercise prescription for aerobic and resistive training based on initial evaluation findings, risk stratification,  comorbidities and participant's personal goals.;Provide advice, education, support and counseling about physical activity/exercise needs.       Expected Outcomes  Short Term: Attend rehab on a regular basis to increase amount of physical activity.;Long Term: Exercising regularly at least 3-5 days a week.;Long Term: Add in home exercise to make exercise part of routine and to increase amount of physical activity.       Increase Strength and Stamina  Yes       Intervention  Develop an individualized exercise prescription for aerobic and resistive training based on initial evaluation findings, risk stratification, comorbidities and participant's personal goals.;Provide advice, education, support and counseling about physical activity/exercise needs.       Expected Outcomes  Short Term: Increase workloads from initial exercise prescription for resistance, speed, and METs.       Able to understand and use rate of perceived exertion (RPE) scale  Yes       Intervention  Provide education and explanation on how to use RPE scale       Expected Outcomes  Short Term: Able to use RPE daily in rehab to express subjective intensity level;Long Term:  Able to use RPE to guide intensity level when exercising independently       Able to understand and use Dyspnea scale  Yes       Intervention  Provide education and explanation on how to use Dyspnea scale       Expected Outcomes  Short Term: Able to use Dyspnea scale  daily in rehab to express subjective sense of shortness of breath during exertion;Long Term: Able to use Dyspnea scale to guide intensity level when exercising independently       Knowledge and understanding of Target Heart Rate Range (THRR)  Yes       Intervention  Provide education and explanation of THRR including how the numbers were predicted and where they are located for reference       Expected Outcomes  Short Term: Able to state/look up THRR;Long Term: Able to use THRR to govern intensity when exercising independently;Short Term: Able to use daily as guideline for intensity in rehab       Able to check pulse independently  Yes       Intervention  Provide education and demonstration on how to check pulse in carotid and radial arteries.;Review the importance of being able to check your own pulse for safety during independent exercise       Expected Outcomes  Short Term: Able to explain why pulse checking is important during independent exercise;Long Term: Able to check pulse independently and accurately       Understanding of Exercise Prescription  Yes       Intervention  Provide education, explanation, and written materials on patient's individual exercise prescription       Expected Outcomes  Short Term: Able to explain program exercise prescription;Long Term: Able to explain home exercise prescription to exercise independently          Exercise Goals Re-Evaluation : Exercise Goals Re-Evaluation    Row Name 09/11/17 1505 10/06/17 1457           Exercise Goal Re-Evaluation   Exercise Goals Review  Increase Physical Activity;Increase Strength and Stamina;Able to understand and use Dyspnea scale;Able to check pulse independently;Able to understand and use rate of perceived exertion (RPE) scale;Knowledge and understanding of Target Heart Rate Range (THRR);Understanding of Exercise Prescription  Increase Physical Activity;Increase Strength and Stamina;Able to understand and use Dyspnea  scale;Able to check pulse independently;Able to understand and use rate of perceived exertion (RPE) scale;Knowledge and understanding of Target Heart Rate Range (THRR);Understanding of Exercise Prescription      Comments  Patient is doing very well in CR and has been very productive on the treadmill as well as the nustep machines. Patient has increased his speed on the treadmill to 1.0 and has increased his watts on the nustep to 97. Patient is still only on his eigth session and has been doing wel. He will be monitored throughout the remainder of the program.   Patient continues to do well in CR. Patient has increased his speed on the treadmill to 1.2. Patient has also increased his SPMs on the nustep to 98. Patient has increased his distance to about a mile each time on the nustep as well. Patient stated that he feels stronger since beginning the program and it is evident through his progressions that it is true.       Expected Outcomes  Patient wishes to become heart healthy and be able to breathe better.   Patient wishes to become heart healthy and be able to breathe better.           Discharge Exercise Prescription (Final Exercise Prescription Changes): Exercise Prescription Changes - 10/06/17 1400      Response to Exercise   Blood Pressure (Admit)  124/72    Blood Pressure (Exercise)  128/72    Blood Pressure (Exit)  110/50    Heart Rate (Admit)  46 bpm    Heart Rate (Exercise)  67 bpm    Heart Rate (Exit)  55 bpm    Rating of Perceived Exertion (Exercise)  10    Duration  Progress to 30 minutes of  aerobic without signs/symptoms of physical distress    Intensity  THRR New 84-102-121      Progression   Progression  Continue to progress workloads to maintain intensity without signs/symptoms of physical distress.      Resistance Training   Training Prescription  Yes    Weight  2    Reps  10-15      Treadmill   MPH  1.2    Grade  0    Minutes  15    METs  1.9      NuStep    Level  2    SPM  98    Minutes  20    METs  1.8      Home Exercise Plan   Plans to continue exercise at  Home (comment)    Frequency  Add 2 additional days to program exercise sessions.    Initial Home Exercises Provided  09/25/17       Nutrition:  Target Goals: Understanding of nutrition guidelines, daily intake of sodium 1500mg , cholesterol 200mg , calories 30% from fat and 7% or less from saturated fats, daily to have 5 or more servings of fruits and vegetables.  Biometrics: Pre Biometrics - 08/24/17 1004      Pre Biometrics   Height  6' (1.829 m)    Weight  178 lb 3.2 oz (80.8 kg)    Waist Circumference  41.5 inches    Hip Circumference  39 inches    Waist to Hip Ratio  1.06 %    BMI (Calculated)  24.16    Triceps Skinfold  8 mm    % Body Fat  24.6 %    Grip Strength  61.26 kg    Flexibility  0  in    Single Leg Stand  1 seconds        Nutrition Therapy Plan and Nutrition Goals: Nutrition Therapy & Goals - 09/21/17 1619      Personal Nutrition Goals   Nutrition Goal  For heart healthy choices add >50% of whole grains, make half their plate fruits and vegetables. Discuss the difference between starchy vegetables and leafy greens, and how leafy vegetables provide fiber, helps maintain healthy weight, helps control blood glucose, and lowers cholesterol.  Discuss purchasing fresh or frozen vegetable to reduce sodium and not to add grease, fat or sugar. Consume <18oz of red meat per week. Consume lean cuts of meats and very little of meats high in sodium and nitrates such as pork and lunch meats. Discussed portion control for all food groups.     Additional Goals?  No      Intervention Plan   Intervention  Nutrition handout(s) given to patient.    Expected Outcomes  Short Term Goal: Understand basic principles of dietary content, such as calories, fat, sodium, cholesterol and nutrients.       Nutrition Assessments:   Nutrition Goals Re-Evaluation: Nutrition Goals  Re-Evaluation    Rafael Capo Name 10/09/17 1402             Goals   Current Weight  177 lb 11.2 oz (80.6 kg)       Nutrition Goal  For heart healthy choices add >50% of whole grains, make half their plate fruits and vegetables. Discuss the difference between starchy vegetables and leafy greens, and how leafy vegetables provide fiber, helps maintain healthy weight, helps control blood glucose, and lowers cholesterol.  Discuss purchasing fresh or frozen vegetable to reduce sodium and not to add grease, fat or sugar. Consume <18oz of red meat per week. Consume lean cuts of meats and very little of meats high in sodium and nitrates such as pork and lunch meats. Discussed portion control for all food groups.        Comment  Patient says he is eating a heart healthy diet.        Expected Outcome  Patient will continue to eat a heart healthy diet.          Personal Goal #2 Re-Evaluation   Personal Goal #2  Patient's family (son and daughter) are helping him with a heart healthy diet.           Nutrition Goals Discharge (Final Nutrition Goals Re-Evaluation): Nutrition Goals Re-Evaluation - 10/09/17 1402      Goals   Current Weight  177 lb 11.2 oz (80.6 kg)    Nutrition Goal  For heart healthy choices add >50% of whole grains, make half their plate fruits and vegetables. Discuss the difference between starchy vegetables and leafy greens, and how leafy vegetables provide fiber, helps maintain healthy weight, helps control blood glucose, and lowers cholesterol.  Discuss purchasing fresh or frozen vegetable to reduce sodium and not to add grease, fat or sugar. Consume <18oz of red meat per week. Consume lean cuts of meats and very little of meats high in sodium and nitrates such as pork and lunch meats. Discussed portion control for all food groups.     Comment  Patient says he is eating a heart healthy diet.     Expected Outcome  Patient will continue to eat a heart healthy diet.       Personal Goal #2  Re-Evaluation   Personal Goal #2  Patient's family (son and daughter)  are helping him with a heart healthy diet.        Psychosocial: Target Goals: Acknowledge presence or absence of significant depression and/or stress, maximize coping skills, provide positive support system. Participant is able to verbalize types and ability to use techniques and skills needed for reducing stress and depression.  Initial Review & Psychosocial Screening: Initial Psych Review & Screening - 08/24/17 1020      Initial Review   Current issues with  None Identified      Family Dynamics   Good Support System?  Yes      Barriers   Psychosocial barriers to participate in program  There are no identifiable barriers or psychosocial needs.      Screening Interventions   Interventions  Encouraged to exercise    Expected Outcomes  Short Term goal: Identification and review with participant of any Quality of Life or Depression concerns found by scoring the questionnaire.;Long Term goal: The participant improves quality of Life and PHQ9 Scores as seen by post scores and/or verbalization of changes       Quality of Life Scores: Quality of Life - 08/24/17 1005      Quality of Life Scores   Health/Function Pre  22.83 %    Socioeconomic Pre  23.88 %    Psych/Spiritual Pre  29.14 %    Family Pre  28.8 %    GLOBAL Pre  25.19 %      Scores of 19 and below usually indicate a poorer quality of life in these areas.  A difference of  2-3 points is a clinically meaningful difference.  A difference of 2-3 points in the total score of the Quality of Life Index has been associated with significant improvement in overall quality of life, self-image, physical symptoms, and general health in studies assessing change in quality of life.  PHQ-9: Recent Review Flowsheet Data    Depression screen [facility] 2/9 08/24/2017   Decreased Interest 0   Down, Depressed, Hopeless 0   PHQ - 2 Score 0   Altered sleeping 0   Tired, decreased  energy 0   Change in appetite 0   Feeling bad or failure about yourself  0   Trouble concentrating 0   Moving slowly or fidgety/restless 0   Suicidal thoughts 0   PHQ-9 Score 0   Difficult doing work/chores Not difficult at all     Interpretation of Total Score  Total Score Depression Severity:  1-4 = Minimal depression, 5-9 = Mild depression, 10-14 = Moderate depression, 15-19 = Moderately severe depression, 20-27 = Severe depression   Psychosocial Evaluation and Intervention: Psychosocial Evaluation - 08/24/17 1021      Psychosocial Evaluation & Interventions   Interventions  Encouraged to exercise with the program and follow exercise prescription    Continue Psychosocial Services   No Follow up required       Psychosocial Re-Evaluation: Psychosocial Re-Evaluation    [place] Name 09/20/17 1304 10/09/17 1406           Psychosocial Re-Evaluation   Current issues with  None Identified  None Identified      Comments  Patient's initial QOL score was 25.19 and his PHQ-9 score was 0 with no psychosocial issues identified.   Patient's initial QOL score was 25.19 and his PHQ-9 score was 0 with no psychosocial issues identified.       Expected Outcomes  Patient will have no psychosocial issues identified at discharge.   Patient will have no psychosocial issues  identified at discharge.       Interventions  Stress management education;Encouraged to attend Cardiac Rehabilitation for the exercise;Relaxation education  Stress management education;Encouraged to attend Cardiac Rehabilitation for the exercise;Relaxation education      Continue Psychosocial Services   No Follow up required  No Follow up required         Psychosocial Discharge (Final Psychosocial Re-Evaluation): Psychosocial Re-Evaluation - 10/09/17 1406      Psychosocial Re-Evaluation   Current issues with  None Identified    Comments  Patient's initial QOL score was 25.19 and his PHQ-9 score was 0 with no psychosocial issues  identified.     Expected Outcomes  Patient will have no psychosocial issues identified at discharge.     Interventions  Stress management education;Encouraged to attend Cardiac Rehabilitation for the exercise;Relaxation education    Continue Psychosocial Services   No Follow up required       Vocational Rehabilitation: Provide vocational rehab assistance to qualifying candidates.   Vocational Rehab Evaluation & Intervention: Vocational Rehab - 08/24/17 1014      Initial Vocational Rehab Evaluation & Intervention   Assessment shows need for Vocational Rehabilitation  No       Education: Education Goals: Education classes will be provided on a weekly basis, covering required topics. Participant will state understanding/return demonstration of topics presented.  Learning Barriers/Preferences: Learning Barriers/Preferences - 08/24/17 1012      Learning Barriers/Preferences   Learning Barriers  Hearing    Learning Preferences  Video;Written Material;Pictoral;Computer/Internet       Education Topics: Hypertension, Hypertension Reduction -Define heart disease and high blood pressure. Discus how high blood pressure affects the body and ways to reduce high blood pressure.   CARDIAC REHAB PHASE II EXERCISE from 10/04/2017 in [place]  Date  09/27/17  Educator  Russella Dar  Instruction Review Code  2- Demonstrated Understanding      Exercise and Your Heart -Discuss why it is important to exercise, the FITT principles of exercise, normal and abnormal responses to exercise, and how to exercise safely.   CARDIAC REHAB PHASE II EXERCISE from 10/04/2017 in [place]  Date  10/04/17  Educator  [place]  Instruction Review Code  2- Demonstrated Understanding      Angina -Discuss definition of angina, causes of angina, treatment of angina, and how to decrease risk of having angina.   Cardiac Medications -Review what the following cardiac  medications are used for, how they affect the body, and side effects that may occur when taking the medications.  Medications include Aspirin, Beta blockers, calcium channel blockers, ACE Inhibitors, angiotensin receptor blockers, diuretics, digoxin, and antihyperlipidemics.   Congestive Heart Failure -Discuss the definition of CHF, how to live with CHF, the signs and symptoms of CHF, and how keep track of weight and sodium intake.   Heart Disease and Intimacy -Discus the effect sexual activity has on the heart, how changes occur during intimacy as we age, and safety during sexual activity.   CARDIAC REHAB PHASE II EXERCISE from 10/04/2017 in [place]  Date  08/30/17  Educator  DJ  Instruction Review Code  2- Demonstrated Understanding      Smoking Cessation / COPD -Discuss different methods to quit smoking, the health benefits of quitting smoking, and the definition of COPD.   Nutrition I: Fats -Discuss the types of cholesterol, what cholesterol does to the heart, and how cholesterol levels can be controlled.   Nutrition II: Labels -Discuss  the different components of food labels and how to read food label   Heart Parts/Heart Disease and PAD -Discuss the anatomy of the heart, the pathway of blood circulation through the heart, and these are affected by heart disease.   Stress I: Signs and Symptoms -Discuss the causes of stress, how stress may lead to anxiety and depression, and ways to limit stress.   CARDIAC REHAB PHASE II EXERCISE from 10/04/2017 in [place]  Date  09/06/17  Educator  [place]  Instruction Review Code  2- Demonstrated Understanding      Stress II: Relaxation -Discuss different types of relaxation techniques to limit stress.   CARDIAC REHAB PHASE II EXERCISE from 10/04/2017 in [place]  Date  09/13/17  Educator  DJ  Instruction Review Code  2- Demonstrated Understanding      Warning  Signs of Stroke / TIA -Discuss definition of a stroke, what the signs and symptoms are of a stroke, and how to identify when someone is having stroke.   CARDIAC REHAB PHASE II EXERCISE from 10/04/2017 in [place]  Date  09/20/17  Educator  DC  Instruction Review Code  2- Demonstrated Understanding      Knowledge Questionnaire Score: Knowledge Questionnaire Score - 08/24/17 1014      Knowledge Questionnaire Score   Pre Score  20/24       Core Components/Risk Factors/Patient Goals at Admission: Personal Goals and Risk Factors at Admission - 08/24/17 1018      Core Components/Risk Factors/Patient Goals on Admission    Weight Management  Weight Maintenance    Personal Goal Other  Yes    Personal Goal  Become more heart healthy and breathe better.     Intervention  Attend CR 3 x week and supplement with home exercise 2 x week.    Expected Outcomes  Reach personal goals       Core Components/Risk Factors/Patient Goals Review:  Goals and Risk Factor Review    Row Name 09/20/17 1301 10/09/17 1404           Core Components/Risk Factors/Patient Goals Review   Personal Goals Review  Weight Management/Obesity Breathe better; be heart healthy.   Weight Management/Obesity Breathe better; be heart healthy.      Review  Patient has completed 12 sessions gaining 2.5 lbs since he started the program. He is doing well in the program with some progression. He says he feels the program has helped him so far with increased energy. He has not seen an improvement in his breathing. He hopes to see more improvement at he continues. Will continue to monitor for progress.   Patient has completed 20 sessions losing 1 lbs since his initial visit. He is doing well in the program with progression. He continues to say he feels stronger and he has more energy. He says he feels less SOB with activity. His balance has also improved along with his overall gait. He is now independent with the  equipment. Will continue to monitor for progress.       Expected Outcomes  Patient will continue to attend sessions and complete the program meeting his personal goals.   Patient will continue to attend sessions and complete the program meeting his personal goals.          Core Components/Risk Factors/Patient Goals at Discharge (Final Review):  Goals and Risk Factor Review - 10/09/17 1404      Core Components/Risk Factors/Patient Goals Review  Personal Goals Review  Weight Management/Obesity Breathe better; be heart healthy.    Review  Patient has completed 20 sessions losing 1 lbs since his initial visit. He is doing well in the program with progression. He continues to say he feels stronger and he has more energy. He says he feels less SOB with activity. His balance has also improved along with his overall gait. He is now independent with the equipment. Will continue to monitor for progress.     Expected Outcomes  Patient will continue to attend sessions and complete the program meeting his personal goals.        ITP Comments: ITP Comments    Row Name 08/24/17 1016 08/24/17 1046 08/24/17 1247 09/21/17 1620     ITP Comments  Patient is been referred to Cardiac Rehab by Dr. Irish Lack due to NSTEMI and Stent placement.   Patient is beening referred to Cardiac Rehab by Dr. Irish Lack due to NSTEMI and Stent placement.   Patient new to program. Plans to start Monday 08/28/17. Will continue to monitor for progress.   Patient attend family matters class with hospital chaplain to discuss how this event has impacted their life and family.       Comments: ITP 30 Day REVIEW Patient doing well in the program. Will continue to monitor for progress.

## 2017-10-11 ENCOUNTER — Encounter (HOSPITAL_COMMUNITY)
Admission: RE | Admit: 2017-10-11 | Discharge: 2017-10-11 | Disposition: A | Discharge status: Home / Self Care | Payer: Medicare Other | Source: Ambulatory Visit | Attending: Cardiology | Admitting: Cardiology

## 2017-10-11 DIAGNOSIS — I214 Non-ST elevation (NSTEMI) myocardial infarction: Secondary | ICD-10-CM | POA: Diagnosis not present

## 2017-10-11 NOTE — Progress Notes (Signed)
Daily Session Note  Patient Details  Name: Paul Snyder MRN: [id number] Date of Birth: 07/07/1935 Referring Provider:     [place] from 08/24/2017 in [place]  Referring Provider  Dr. [name]      Encounter Date: 10/11/2017  Check In: Session Check In - 10/11/17 0815      Check-In   Location  [facility]    Staff Present  Russella Dar, MS, EP, [facility], Exercise Physiologist;Gregory Luther Parody, BS, EP, Exercise Physiologist    Supervising physician immediately available to respond to emergencies  See telemetry face sheet for immediately available MD    Medication changes reported      No    Fall or balance concerns reported     No    Warm-up and Cool-down  Performed as group-led instruction    Resistance Training Performed  Yes    VAD Patient?  No      Pain Assessment   Currently in Pain?  No/denies    Pain Score  0-No pain    Multiple Pain Sites  No       Capillary Blood Glucose: No results found for this or any previous visit (from the past 24 hour(s)).    Social History   Tobacco Use  Smoking Status Former Smoker  . Packs/day: 1.00  . Years: 42.00  . Pack years: 42.00  . Types: Cigarettes  . Last attempt to quit: 1996  . Years since quitting: 23.4  Smokeless Tobacco Never Used    Goals Met:  Independence with exercise equipment Exercise tolerated well No report of cardiac concerns or symptoms Strength training completed today  Goals Unmet:  Not Applicable  Comments: Check out: 0915    Dr. Kate Sable is Medical Director for [place] and Pulmonary Rehab.

## 2017-10-13 ENCOUNTER — Encounter (HOSPITAL_COMMUNITY)
Admission: RE | Admit: 2017-10-13 | Discharge: 2017-10-13 | Disposition: A | Discharge status: Home / Self Care | Payer: Medicare Other | Source: Ambulatory Visit | Attending: Cardiology | Admitting: Cardiology

## 2017-10-13 DIAGNOSIS — I214 Non-ST elevation (NSTEMI) myocardial infarction: Secondary | ICD-10-CM | POA: Diagnosis not present

## 2017-10-13 DIAGNOSIS — Z955 Presence of coronary angioplasty implant and graft: Secondary | ICD-10-CM

## 2017-10-13 NOTE — Progress Notes (Signed)
Daily Session Note  Patient Details  Name: Paul Snyder MRN: [id number] Date of Birth: [birth year] Referring Provider:     [place] from 08/24/2017 in [place]  Referring Provider  Dr. [name]      Encounter Date: 10/13/2017  Check In: Session Check In - 10/13/17 0815      Check-In   Location  [facility]    Staff Present  Diane Angelina Pih, MS, EP, [facility], Exercise Physiologist;Gregory Luther Parody, BS, EP, Exercise Physiologist;Qusay Villada Wynetta Emery, RN, BSN    Supervising physician immediately available to respond to emergencies  See telemetry face sheet for immediately available MD    Medication changes reported      No    Fall or balance concerns reported     No    Warm-up and Cool-down  Performed as group-led instruction    Resistance Training Performed  Yes    VAD Patient?  No      Pain Assessment   Currently in Pain?  No/denies    Pain Score  0-No pain    Multiple Pain Sites  No       Capillary Blood Glucose: No results found for this or any previous visit (from the past 24 hour(s)).    Social History   Tobacco Use  Smoking Status Former Smoker  . Packs/day: 1.00  . Years: 42.00  . Pack years: 42.00  . Types: Cigarettes  . Last attempt to quit: 1996  . Years since quitting: 23.4  Smokeless Tobacco Never Used    Goals Met:  Independence with exercise equipment Exercise tolerated well No report of cardiac concerns or symptoms Strength training completed today Personal Goals reviewed today Goals Unmet:  Not Applicable  Comments: Check out 915.   Dr. Kate Sable is Medical Director for [facility] Cardiac and Pulmonary Rehab.

## 2017-10-16 ENCOUNTER — Encounter (HOSPITAL_COMMUNITY): Payer: Medicare Other

## 2017-10-18 ENCOUNTER — Encounter (HOSPITAL_COMMUNITY)
Admission: RE | Admit: 2017-10-18 | Discharge: 2017-10-18 | Disposition: A | Discharge status: Home / Self Care | Payer: Medicare Other | Source: Ambulatory Visit | Attending: Cardiology | Admitting: Cardiology

## 2017-10-18 DIAGNOSIS — I214 Non-ST elevation (NSTEMI) myocardial infarction: Secondary | ICD-10-CM | POA: Diagnosis not present

## 2017-10-18 NOTE — Progress Notes (Signed)
Daily Session Note  Patient Details  Name: Shaya J Friel MRN: [id number] Date of Birth: 02/23/1936 Referring Provider:     CARDIAC REHAB PHASE II ORIENTATION from 08/24/2017 in [place] [facility]  Referring Provider  Dr. Branch      Encounter Date: 10/18/2017  Check In: Session Check In - 10/18/17 0815      Check-In   Location  [facility]    Staff Present  Diane Coad, MS, EP, [facility], Exercise Physiologist;Gregory Cowan, BS, EP, Exercise Physiologist    Supervising physician immediately available to respond to emergencies  See telemetry face sheet for immediately available MD    Medication changes reported      No    Fall or balance concerns reported     No    Warm-up and Cool-down  Performed as group-led instruction    Resistance Training Performed  Yes    VAD Patient?  No      Pain Assessment   Currently in Pain?  No/denies    Pain Score  0-No pain    Multiple Pain Sites  No       Capillary Blood Glucose: No results found for this or any previous visit (from the past 24 hour(s)).    Social History   Tobacco Use  Smoking Status Former Smoker  . Packs/day: 1.00  . Years: 42.00  . Pack years: 42.00  . Types: Cigarettes  . Last attempt to quit: 1996  . Years since quitting: 23.4  Smokeless Tobacco Never Used    Goals Met:  Independence with exercise equipment Exercise tolerated well No report of cardiac concerns or symptoms Strength training completed today  Goals Unmet:  Not Applicable  Comments: Check out: 0915   Dr. Suresh Koneswaran is Medical Director for [place] Cardiac and Pulmonary Rehab. 

## 2017-10-20 ENCOUNTER — Encounter (HOSPITAL_COMMUNITY)
Admission: RE | Admit: 2017-10-20 | Discharge: 2017-10-20 | Disposition: A | Discharge status: Home / Self Care | Payer: Medicare Other | Source: Ambulatory Visit | Attending: Cardiology | Admitting: Cardiology

## 2017-10-20 DIAGNOSIS — Z955 Presence of coronary angioplasty implant and graft: Secondary | ICD-10-CM

## 2017-10-20 DIAGNOSIS — I214 Non-ST elevation (NSTEMI) myocardial infarction: Secondary | ICD-10-CM | POA: Diagnosis not present

## 2017-10-20 NOTE — Progress Notes (Signed)
Daily Session Note  Patient Details  Name: Paul Snyder MRN: [id number] Date of Birth: 1935-08-28 Referring Provider:     [place] from 08/24/2017 in [place]  Referring Provider  Dr. [name]      Encounter Date: 10/20/2017  Check In: Session Check In - 10/20/17 0815      Check-In   Location  [facility]    Staff Present  Suzanne Boron, BS, EP, Exercise Physiologist;Tamatha Gadbois Wynetta Emery, RN, BSN    Supervising physician immediately available to respond to emergencies  See telemetry face sheet for immediately available MD    Medication changes reported      No    Fall or balance concerns reported     No    Warm-up and Cool-down  Performed as group-led instruction    Resistance Training Performed  Yes    VAD Patient?  No      Pain Assessment   Currently in Pain?  No/denies    Pain Score  0-No pain    Multiple Pain Sites  No       Capillary Blood Glucose: No results found for this or any previous visit (from the past 24 hour(s)).    Social History   Tobacco Use  Smoking Status Former Smoker  . Packs/day: 1.00  . Years: 42.00  . Pack years: 42.00  . Types: Cigarettes  . Last attempt to quit: 1996  . Years since quitting: 23.4  Smokeless Tobacco Never Used    Goals Met:  Independence with exercise equipment Exercise tolerated well No report of cardiac concerns or symptoms Strength training completed today  Goals Unmet:  Not Applicable  Comments: Check out 915.   Dr. Kate Sable is Medical Director for [facility] Cardiac and Pulmonary Rehab.

## 2017-10-23 ENCOUNTER — Encounter (HOSPITAL_COMMUNITY)
Admission: RE | Admit: 2017-10-23 | Discharge: 2017-10-23 | Disposition: A | Discharge status: Home / Self Care | Payer: Medicare Other | Source: Ambulatory Visit | Attending: Cardiology | Admitting: Cardiology

## 2017-10-23 DIAGNOSIS — Z955 Presence of coronary angioplasty implant and graft: Secondary | ICD-10-CM | POA: Insufficient documentation

## 2017-10-23 DIAGNOSIS — I214 Non-ST elevation (NSTEMI) myocardial infarction: Secondary | ICD-10-CM | POA: Insufficient documentation

## 2017-10-23 NOTE — Progress Notes (Signed)
Daily Session Note  Patient Details  Name: Paul Snyder MRN: [id number] Date of Birth: 12-15-1935 Referring Provider:     CARDIAC REHAB PHASE II ORIENTATION from 08/24/2017 in [place]  Referring Provider  Dr. [name]      Encounter Date: 10/23/2017  Check In: Session Check In - 10/23/17 0853      Check-In   Location  [facility]    Staff Present  Suzanne Boron, BS, EP, Exercise Physiologist;Debra Wynetta Emery, RN, BSN;Diane Coad, MS, EP, [facility], Exercise Physiologist    Supervising physician immediately available to respond to emergencies  See telemetry face sheet for immediately available MD    Medication changes reported      No    Fall or balance concerns reported     No    Warm-up and Cool-down  Performed as group-led instruction    Resistance Training Performed  Yes    VAD Patient?  No      Pain Assessment   Currently in Pain?  No/denies    Pain Score  0-No pain    Multiple Pain Sites  No       Capillary Blood Glucose: No results found for this or any previous visit (from the past 24 hour(s)).    Social History   Tobacco Use  Smoking Status Former Smoker  . Packs/day: 1.00  . Years: 42.00  . Pack years: 42.00  . Types: Cigarettes  . Last attempt to quit: 1996  . Years since quitting: 23.4  Smokeless Tobacco Never Used    Goals Met:  Independence with exercise equipment Exercise tolerated well No report of cardiac concerns or symptoms Strength training completed today  Goals Unmet:  Not Applicable  Comments: Check out 915   Dr. Kate Sable is Medical Director for [place] and Pulmonary Rehab.

## 2017-10-25 ENCOUNTER — Encounter (HOSPITAL_COMMUNITY)
Admission: RE | Admit: 2017-10-25 | Discharge: 2017-10-25 | Disposition: A | Discharge status: Home / Self Care | Payer: Medicare Other | Source: Ambulatory Visit | Attending: Cardiology | Admitting: Cardiology

## 2017-10-25 DIAGNOSIS — I214 Non-ST elevation (NSTEMI) myocardial infarction: Secondary | ICD-10-CM

## 2017-10-25 DIAGNOSIS — Z955 Presence of coronary angioplasty implant and graft: Secondary | ICD-10-CM

## 2017-10-25 NOTE — Progress Notes (Signed)
Daily Session Note  Patient Details  Name: Paul Snyder MRN: [id number] Date of Birth: [birth year] Referring Provider:     CARDIAC REHAB PHASE II ORIENTATION from 08/24/2017 in [place]  Referring Provider  Dr. [name]      Encounter Date: 10/25/2017  Check In: Session Check In - 10/25/17 0815      Check-In   Location  [facility]    Staff Present  Russella Dar, MS, EP, [facility], Exercise Physiologist;Franziska Podgurski Wynetta Emery, RN, BSN    Supervising physician immediately available to respond to emergencies  See telemetry face sheet for immediately available MD    Medication changes reported      No    Fall or balance concerns reported     No    Warm-up and Cool-down  Performed as group-led instruction    Resistance Training Performed  Yes    VAD Patient?  No      Pain Assessment   Currently in Pain?  No/denies    Pain Score  0-No pain    Multiple Pain Sites  No       Capillary Blood Glucose: No results found for this or any previous visit (from the past 24 hour(s)).    Social History   Tobacco Use  Smoking Status Former Smoker  . Packs/day: 1.00  . Years: 42.00  . Pack years: 42.00  . Types: Cigarettes  . Last attempt to quit: 1996  . Years since quitting: 23.4  Smokeless Tobacco Never Used    Goals Met:  Independence with exercise equipment Exercise tolerated well No report of cardiac concerns or symptoms Strength training completed today  Goals Unmet:  Not Applicable  Comments: Check out 915.   Dr. Kate Sable is Medical Director for [facility] Cardiac and Pulmonary Rehab.

## 2017-10-27 ENCOUNTER — Encounter (HOSPITAL_COMMUNITY)
Admission: RE | Admit: 2017-10-27 | Discharge: 2017-10-27 | Disposition: A | Discharge status: Home / Self Care | Payer: Medicare Other | Source: Ambulatory Visit | Attending: Cardiology | Admitting: Cardiology

## 2017-10-27 DIAGNOSIS — Z955 Presence of coronary angioplasty implant and graft: Secondary | ICD-10-CM

## 2017-10-27 DIAGNOSIS — I214 Non-ST elevation (NSTEMI) myocardial infarction: Secondary | ICD-10-CM | POA: Diagnosis not present

## 2017-10-27 NOTE — Progress Notes (Signed)
Daily Session Note  Patient Details  Name: LINZIE BOURSIQUOT MRN: [id number] Date of Birth: Apr 20, 1936 Referring Provider:     CARDIAC REHAB PHASE II ORIENTATION from 08/24/2017 in [place]  Referring Provider  Dr. [name]      Encounter Date: 10/27/2017  Check In: Session Check In - 10/27/17 0815      Check-In   Location  [facility]    Staff Present  Russella Dar, MS, EP, [facility], Exercise Physiologist;Wm Sahagun Wynetta Emery, RN, BSN    Supervising physician immediately available to respond to emergencies  See telemetry face sheet for immediately available MD    Medication changes reported      No    Fall or balance concerns reported     No    Warm-up and Cool-down  Performed as group-led instruction    Resistance Training Performed  Yes    VAD Patient?  No      Pain Assessment   Currently in Pain?  No/denies    Pain Score  0-No pain    Multiple Pain Sites  No       Capillary Blood Glucose: No results found for this or any previous visit (from the past 24 hour(s)).    Social History   Tobacco Use  Smoking Status Former Smoker  . Packs/day: 1.00  . Years: 42.00  . Pack years: 42.00  . Types: Cigarettes  . Last attempt to quit: 1996  . Years since quitting: 23.4  Smokeless Tobacco Never Used    Goals Met:  Independence with exercise equipment Exercise tolerated well No report of cardiac concerns or symptoms Strength training completed today  Goals Unmet:  Not Applicable  Comments: Check out 915.   Dr. Kate Sable is Medical Director for [facility] Cardiac and Pulmonary Rehab.

## 2017-10-30 ENCOUNTER — Encounter (HOSPITAL_COMMUNITY)
Admission: RE | Admit: 2017-10-30 | Discharge: 2017-10-30 | Disposition: A | Discharge status: Home / Self Care | Payer: Medicare Other | Source: Ambulatory Visit | Attending: Cardiology | Admitting: Cardiology

## 2017-10-30 DIAGNOSIS — I214 Non-ST elevation (NSTEMI) myocardial infarction: Secondary | ICD-10-CM

## 2017-10-30 DIAGNOSIS — Z955 Presence of coronary angioplasty implant and graft: Secondary | ICD-10-CM

## 2017-10-30 NOTE — Progress Notes (Signed)
Daily Session Note  Patient Details  Name: AZAZEL FRANZE MRN: [id number] Date of Birth: 1935/05/26 Referring Provider:     CARDIAC REHAB PHASE II ORIENTATION from 08/24/2017 in [place]  Referring Provider  Dr. [name]      Encounter Date: 10/30/2017  Check In: Session Check In - 10/30/17 0904      Check-In   Location  [facility]    Staff Present  Russella Dar, MS, EP, [facility], Exercise Physiologist;Debra Wynetta Emery, RN, BSN;[place], BS, EP, Exercise Physiologist    Supervising physician immediately available to respond to emergencies  See telemetry face sheet for immediately available MD    Medication changes reported      No    Fall or balance concerns reported     No    Warm-up and Cool-down  Performed as group-led instruction    Resistance Training Performed  Yes    VAD Patient?  No      Pain Assessment   Currently in Pain?  No/denies    Pain Score  0-No pain    Multiple Pain Sites  No       Capillary Blood Glucose: No results found for this or any previous visit (from the past 24 hour(s)).    Social History   Tobacco Use  Smoking Status Former Smoker  . Packs/day: 1.00  . Years: 42.00  . Pack years: 42.00  . Types: Cigarettes  . Last attempt to quit: 1996  . Years since quitting: 23.4  Smokeless Tobacco Never Used    Goals Met:  Independence with exercise equipment Exercise tolerated well No report of cardiac concerns or symptoms Strength training completed today  Goals Unmet:  Not Applicable  Comments: Check out 915   Dr. Kate Sable is Medical Director for [place] and Pulmonary Rehab.

## 2017-10-31 ENCOUNTER — Telehealth: Payer: Self-pay

## 2017-10-31 MED ORDER — METOPROLOL TARTRATE 25 MG PO TABS: ORAL_TABLET | ORAL | 3 refills | Status: DC

## 2017-10-31 NOTE — Telephone Encounter (Signed)
-----   Message from Arnoldo Lenis, MD sent at 10/30/2017 12:52 PM EDT ----- Regarding: RE: Bradycardia Thanks for update, Lattie Haw can we have him stop his lopressor until he sees me in clinic   Zandra Abts MD ----- Message ----- From: Dwana Melena, RN Sent: 10/27/2017   4:12 PM To: Arnoldo Lenis, MD Subject: Bradycardia                                    Mr. Croghan is scheduled to see you for the first time June 18th. He is in cardiac rehab on his 27th session referred by [place] for NSTEMI/Stent placement on 07/19/17. He has done well overall; however, his resting heart rate as been trending down for the last few weeks. He has always been in sinus bradycardia at 50 on average resting, but recently he has been in the low 40's and his lowest rate today was 38. He is on Metoprolol 12.5 mg bid. Diane and I just wanted you to know this.  Thanks!  Lisabeth Register, RN

## 2017-10-31 NOTE — Telephone Encounter (Signed)
LM on pt's phone to hold lopressor until f/u on 11/07/17 with Dr branch in the [place] office

## 2017-11-01 ENCOUNTER — Encounter (HOSPITAL_COMMUNITY)
Admission: RE | Admit: 2017-11-01 | Discharge: 2017-11-01 | Disposition: A | Discharge status: Home / Self Care | Payer: Medicare Other | Source: Ambulatory Visit | Attending: Cardiology | Admitting: Cardiology

## 2017-11-01 DIAGNOSIS — Z955 Presence of coronary angioplasty implant and graft: Secondary | ICD-10-CM

## 2017-11-01 DIAGNOSIS — I214 Non-ST elevation (NSTEMI) myocardial infarction: Secondary | ICD-10-CM | POA: Diagnosis not present

## 2017-11-01 NOTE — Progress Notes (Addendum)
Daily Session Note  Patient Details  Name: Paul Snyder MRN: [id number] Date of Birth: 01-07-1936 Referring Provider:     CARDIAC REHAB PHASE II ORIENTATION from 08/24/2017 in [place]  Referring Provider  Dr. [name]      Encounter Date: 11/01/2017  Check In: Session Check In - 11/01/17 0815      Check-In   Location  [facility]    Staff Present  Russella Dar, MS, EP, [facility], Exercise Physiologist;Sherod Cisse Wynetta Emery, RN, BSN;Gregory Cowan, BS, EP, Exercise Physiologist    Supervising physician immediately available to respond to emergencies  See telemetry face sheet for immediately available MD    Medication changes reported      Yes    Comments  Cardiiologist discontinued Metoprolol until he sees him 11/07/17 for bradycardia.    Fall or balance concerns reported     No    Warm-up and Cool-down  Performed as group-led instruction    Resistance Training Performed  Yes    VAD Patient?  No      Pain Assessment   Currently in Pain?  No/denies    Pain Score  0-No pain    Multiple Pain Sites  No       Capillary Blood Glucose: No results found for this or any previous visit (from the past 24 hour(s)).    Social History   Tobacco Use  Smoking Status Former Smoker  . Packs/day: 1.00  . Years: 42.00  . Pack years: 42.00  . Types: Cigarettes  . Last attempt to quit: 1996  . Years since quitting: 23.4  Smokeless Tobacco Never Used    Goals Met:  Independence with exercise equipment Exercise tolerated well No report of cardiac concerns or symptoms Strength training completed today  Goals Unmet:  Not Applicable  Comments: Check out 915.   Dr. Kate Sable is Medical Director for [facility] Cardiac and Pulmonary Rehab.

## 2017-11-02 NOTE — Progress Notes (Signed)
Cardiac Individual Treatment Plan  Patient Details  Name: Paul Snyder MRN: [id number] Date of Birth: 07/20/1935 Referring Provider:     CARDIAC REHAB PHASE II ORIENTATION from 08/24/2017 in [place]  Referring Provider  Dr. [name]      Initial Encounter Date:    CARDIAC REHAB PHASE II ORIENTATION from 08/24/2017 in [place]  Date  08/24/17  Referring Provider  Dr. [name]      Visit Diagnosis: NSTEMI (non-ST elevated myocardial infarction) [facility])  Status post coronary artery stent placement  Patient's Home Medications on Admission:  Current Outpatient Medications:  .  albuterol (PROVENTIL HFA;VENTOLIN HFA) 108 (90 Base) MCG/ACT inhaler, Inhale 2 puffs into the lungs 4 (four) times daily. , Disp: , Rfl:  .  aspirin EC 81 MG EC tablet, Take 1 tablet (81 mg total) by mouth daily., Disp: , Rfl:  .  atorvastatin (LIPITOR) 80 MG tablet, Take 1 tablet (80 mg total) by mouth daily at 6 PM., Disp: 90 tablet, Rfl: 3 .  furosemide (LASIX) 20 MG tablet, Take 20 mg by mouth daily., Disp: , Rfl:  .  metoprolol tartrate (LOPRESSOR) 25 MG tablet, 10/31/17 HOLD for bradycardia, Disp: 180 tablet, Rfl: 3 .  niacin 500 MG tablet, Take 500 mg by mouth daily. , Disp: , Rfl:  .  nitroGLYCERIN (NITROSTAT) 0.4 MG SL tablet, Place 1 tablet (0.4 mg total) under the tongue every 5 (five) minutes x 3 doses as needed for chest pain., Disp: 25 tablet, Rfl: 2 .  pantoprazole (PROTONIX) 40 MG tablet, Take 40 mg by mouth daily., Disp: , Rfl:  .  ticagrelor (BRILINTA) 90 MG TABS tablet, Take 1 tablet (90 mg total) by mouth 2 (two) times daily., Disp: 180 tablet, Rfl: 2  Past Medical History: Past Medical History:  Diagnosis Date  . CHF (congestive heart failure) ([place])   . COPD (chronic obstructive pulmonary disease) ([place])   . GERD (gastroesophageal reflux disease)   . Hyperlipidemia   . Lung cancer ([place]) 2009  . NSTEMI (non-ST elevated myocardial infarction) ([place])  07/16/2017   2/19 PCI/DES x1 to Lcx with moderate (50%) in the RCA, mild (20%) in LAD    Tobacco Use: Social History   Tobacco Use  Smoking Status Former Smoker  . Packs/day: 1.00  . Years: 42.00  . Pack years: 42.00  . Types: Cigarettes  . Last attempt to quit: 1996  . Years since quitting: 23.4  Smokeless Tobacco Never Used    Labs: Recent Chemical engineer    Labs for ITP Cardiac and Pulmonary Rehab Latest Ref Rng & Units 07/20/2017   Cholestrol 0 - 200 mg/dL 126   LDLCALC 0 - 99 mg/dL 71   HDL >40 mg/dL 32(L)   Trlycerides <150 mg/dL 117   Hemoglobin A1c 4.8 - 5.6 % 5.8(H)      Capillary Blood Glucose: Lab Results  Component Value Date   GLUCAP 94 01/21/2009     Exercise Target Goals:    Exercise Program Goal: Individual exercise prescription set using results from initial 6 min walk test and THRR while considering  patient's activity barriers and safety.   Exercise Prescription Goal: Starting with aerobic activity 30 plus minutes a day, 3 days per week for initial exercise prescription. Provide home exercise prescription and guidelines that participant acknowledges understanding prior to discharge.  Activity Barriers & Risk Stratification: Activity Barriers & Cardiac Risk Stratification - 08/24/17 1002      Activity Barriers & Cardiac  Risk Stratification   Activity Barriers  Shortness of Breath;Deconditioning    Cardiac Risk Stratification  High       6 Minute Walk: 6 Minute Walk    Row Name 08/24/17 1002         6 Minute Walk   Phase  Initial     Distance  850 feet     Distance % Change  0 %     Distance Feet Change  0 ft     Walk Time  6 minutes     # of Rest Breaks  0     MPH  1.6     METS  2.23     RPE  12     Perceived Dyspnea   11     VO2 Peak  6.16     Symptoms  No     Resting HR  51 bpm     Resting BP  100/50     Resting Oxygen Saturation   95 %     Exercise Oxygen Saturation  during 6 min walk  93 %     Max Ex. HR  89 bpm      Max Ex. BP  128/58     2 Minute Post BP  106/52        Oxygen Initial Assessment:   Oxygen Re-Evaluation:   Oxygen Discharge (Final Oxygen Re-Evaluation):   Initial Exercise Prescription: Initial Exercise Prescription - 08/24/17 1000      Date of Initial Exercise RX and Referring Provider   Date  08/24/17    Referring Provider  Dr. [name]      Treadmill   MPH  0.8    Grade  0    Minutes  15    METs  1.6      NuStep   Level  1    SPM  56    Minutes  20    METs  1.6      Prescription Details   Frequency (times per week)  3    Duration  Progress to 30 minutes of continuous aerobic without signs/symptoms of physical distress      Intensity   THRR 40-80% of Max Heartrate  [phone]    Ratings of Perceived Exertion  11-13    Perceived Dyspnea  0-4      Progression   Progression  Continue progressive overload as per policy without signs/symptoms or physical distress.      Resistance Training   Training Prescription  Yes    Weight  1    Reps  10-15       Perform Capillary Blood Glucose checks as needed.  Exercise Prescription Changes:  Exercise Prescription Changes    Row Name 09/11/17 1500 09/26/17 0700 10/06/17 1400 10/20/17 1400       Response to Exercise   Blood Pressure (Admit)  114/62  120/50  124/72  128/70    Blood Pressure (Exercise)  100/54  104/50  128/72  140/60    Blood Pressure (Exit)  114/50  110/50  110/50  110/56    Heart Rate (Admit)  77 bpm  55 bpm  46 bpm  46 bpm    Heart Rate (Exercise)  85 bpm  65 bpm  67 bpm  83 bpm    Heart Rate (Exit)  66 bpm  59 bpm  55 bpm  55 bpm    Rating of Perceived Exertion (Exercise)  10  10  10   11  Duration  Progress to 30 minutes of  aerobic without signs/symptoms of physical distress  Progress to 30 minutes of  aerobic without signs/symptoms of physical distress  Progress to 30 minutes of  aerobic without signs/symptoms of physical distress  Progress to 30 minutes of  aerobic without signs/symptoms  of physical distress    Intensity  THRR New 102-115-127  THRR New 89-106-123  THRR New 84-102-121  THRR New 84-103-121      Progression   Progression  Continue to progress workloads to maintain intensity without signs/symptoms of physical distress.  Continue to progress workloads to maintain intensity without signs/symptoms of physical distress.  Continue to progress workloads to maintain intensity without signs/symptoms of physical distress.  Continue to progress workloads to maintain intensity without signs/symptoms of physical distress.      Resistance Training   Training Prescription  Yes  Yes  Yes  Yes    Weight  1  2  2  2     Reps  10-15  10-15  10-15  10-15      Treadmill   MPH  1  1.1  1.2  1.3    Grade  0  0  0  0    Minutes  15  15  15  15     METs  1.7  1.8  1.9  1.9      NuStep   Level  1  2  2  2     SPM  97  89  98  102    Minutes  20  20  20  20     METs  1.9  1.8  1.8  1.8      Home Exercise Plan   Plans to continue exercise at  -  Home (comment)  Home (comment)  Home (comment)    Frequency  -  Add 2 additional days to program exercise sessions.  Add 2 additional days to program exercise sessions.  Add 2 additional days to program exercise sessions.    Initial Home Exercises Provided  -  09/25/17  09/25/17  09/25/17       Exercise Comments:  Exercise Comments    Row Name 09/11/17 1507 09/26/17 0744 10/06/17 1459 10/20/17 1429     Exercise Comments  Patient is doing very well in CR and has been very productive on the treadmill as well as the nustep machines. Patient has increased his speed on the treadmill to 1.0 and has increased his watts on the nustep to 97. Patient is still only on his eigth session and has been doing wel. He will be monitored throughout the remainder of the program.   Patient is doing very well in CR. Patient has increased his level on the nustep machine and as well his speed on the treadmill. Patient has had no complaints since starting the  program and seems to be enjoying the company of others. Patient does not complain of SOB while on his machines which was one of his goals from the beginning of the program.   Patient continues to do well in CR. Patient has increased his speed on the treadmill to 1.2. Patient has also increased his SPMs on the nustep to 98. Patient has increased his distance to about a mile each time on the nustep as well. Patient stated that he feels stronger since beginning the program and it is evident through his progressions that it is true.   Patient is doing well in CR. patient has increased his speed  on the treadmill to 1.3 and has also maintained his level on the nustep machine and the SPMs. Patient has stated to me that he feels more energy and strength from the program.        Exercise Goals and Review:  Exercise Goals    Row Name 08/24/17 1004             Exercise Goals   Increase Physical Activity  Yes       Intervention  Develop an individualized exercise prescription for aerobic and resistive training based on initial evaluation findings, risk stratification, comorbidities and participant's personal goals.;Provide advice, education, support and counseling about physical activity/exercise needs.       Expected Outcomes  Short Term: Attend rehab on a regular basis to increase amount of physical activity.;Long Term: Exercising regularly at least 3-5 days a week.;Long Term: Add in home exercise to make exercise part of routine and to increase amount of physical activity.       Increase Strength and Stamina  Yes       Intervention  Develop an individualized exercise prescription for aerobic and resistive training based on initial evaluation findings, risk stratification, comorbidities and participant's personal goals.;Provide advice, education, support and counseling about physical activity/exercise needs.       Expected Outcomes  Short Term: Increase workloads from initial exercise prescription for  resistance, speed, and METs.       Able to understand and use rate of perceived exertion (RPE) scale  Yes       Intervention  Provide education and explanation on how to use RPE scale       Expected Outcomes  Short Term: Able to use RPE daily in rehab to express subjective intensity level;Long Term:  Able to use RPE to guide intensity level when exercising independently       Able to understand and use Dyspnea scale  Yes       Intervention  Provide education and explanation on how to use Dyspnea scale       Expected Outcomes  Short Term: Able to use Dyspnea scale daily in rehab to express subjective sense of shortness of breath during exertion;Long Term: Able to use Dyspnea scale to guide intensity level when exercising independently       Knowledge and understanding of Target Heart Rate Range (THRR)  Yes       Intervention  Provide education and explanation of THRR including how the numbers were predicted and where they are located for reference       Expected Outcomes  Short Term: Able to state/look up THRR;Long Term: Able to use THRR to govern intensity when exercising independently;Short Term: Able to use daily as guideline for intensity in rehab       Able to check pulse independently  Yes       Intervention  Provide education and demonstration on how to check pulse in carotid and radial arteries.;Review the importance of being able to check your own pulse for safety during independent exercise       Expected Outcomes  Short Term: Able to explain why pulse checking is important during independent exercise;Long Term: Able to check pulse independently and accurately       Understanding of Exercise Prescription  Yes       Intervention  Provide education, explanation, and written materials on patient's individual exercise prescription       Expected Outcomes  Short Term: Able to explain program exercise prescription;Long Term: Able to explain  home exercise prescription to exercise independently           Exercise Goals Re-Evaluation : Exercise Goals Re-Evaluation    Row Name 09/11/17 1505 10/06/17 1457 10/30/17 1511         Exercise Goal Re-Evaluation   Exercise Goals Review  Increase Physical Activity;Increase Strength and Stamina;Able to understand and use Dyspnea scale;Able to check pulse independently;Able to understand and use rate of perceived exertion (RPE) scale;Knowledge and understanding of Target Heart Rate Range (THRR);Understanding of Exercise Prescription  Increase Physical Activity;Increase Strength and Stamina;Able to understand and use Dyspnea scale;Able to check pulse independently;Able to understand and use rate of perceived exertion (RPE) scale;Knowledge and understanding of Target Heart Rate Range (THRR);Understanding of Exercise Prescription  Increase Physical Activity;Increase Strength and Stamina;Able to understand and use Dyspnea scale;Able to check pulse independently;Able to understand and use rate of perceived exertion (RPE) scale;Knowledge and understanding of Target Heart Rate Range (THRR);Understanding of Exercise Prescription     Comments  Patient is doing very well in CR and has been very productive on the treadmill as well as the nustep machines. Patient has increased his speed on the treadmill to 1.0 and has increased his watts on the nustep to 97. Patient is still only on his eigth session and has been doing wel. He will be monitored throughout the remainder of the program.   Patient continues to do well in CR. Patient has increased his speed on the treadmill to 1.2. Patient has also increased his SPMs on the nustep to 98. Patient has increased his distance to about a mile each time on the nustep as well. Patient stated that he feels stronger since beginning the program and it is evident through his progressions that it is true.   Patient is doing well in CR. Patient states that his is helping him breathe better and has much more endurance and strength. Patient  looks better overall.      Expected Outcomes  Patient wishes to become heart healthy and be able to breathe better.   Patient wishes to become heart healthy and be able to breathe better.   Patient wishes to become heart healthy and be able to breathe better.          Discharge Exercise Prescription (Final Exercise Prescription Changes): Exercise Prescription Changes - 10/20/17 1400      Response to Exercise   Blood Pressure (Admit)  128/70    Blood Pressure (Exercise)  140/60    Blood Pressure (Exit)  110/56    Heart Rate (Admit)  46 bpm    Heart Rate (Exercise)  83 bpm    Heart Rate (Exit)  55 bpm    Rating of Perceived Exertion (Exercise)  11    Duration  Progress to 30 minutes of  aerobic without signs/symptoms of physical distress    Intensity  THRR New 84-103-121      Progression   Progression  Continue to progress workloads to maintain intensity without signs/symptoms of physical distress.      Resistance Training   Training Prescription  Yes    Weight  2    Reps  10-15      Treadmill   MPH  1.3    Grade  0    Minutes  15    METs  1.9      NuStep   Level  2    SPM  102    Minutes  20    METs  1.8  Home Exercise Plan   Plans to continue exercise at  Home (comment)    Frequency  Add 2 additional days to program exercise sessions.    Initial Home Exercises Provided  09/25/17       Nutrition:  Target Goals: Understanding of nutrition guidelines, daily intake of sodium 1500mg , cholesterol 200mg , calories 30% from fat and 7% or less from saturated fats, daily to have 5 or more servings of fruits and vegetables.  Biometrics: Pre Biometrics - 08/24/17 1004      Pre Biometrics   Height  6' (1.829 m)    Weight  178 lb 3.2 oz (80.8 kg)    Waist Circumference  41.5 inches    Hip Circumference  39 inches    Waist to Hip Ratio  1.06 %    BMI (Calculated)  24.16    Triceps Skinfold  8 mm    % Body Fat  24.6 %    Grip Strength  61.26 kg    Flexibility  0 in     Single Leg Stand  1 seconds        Nutrition Therapy Plan and Nutrition Goals: Nutrition Therapy & Goals - 09/21/17 1619      Personal Nutrition Goals   Nutrition Goal  For heart healthy choices add >50% of whole grains, make half their plate fruits and vegetables. Discuss the difference between starchy vegetables and leafy greens, and how leafy vegetables provide fiber, helps maintain healthy weight, helps control blood glucose, and lowers cholesterol.  Discuss purchasing fresh or frozen vegetable to reduce sodium and not to add grease, fat or sugar. Consume <18oz of red meat per week. Consume lean cuts of meats and very little of meats high in sodium and nitrates such as pork and lunch meats. Discussed portion control for all food groups.     Additional Goals?  No      Intervention Plan   Intervention  Nutrition handout(s) given to patient.    Expected Outcomes  Short Term Goal: Understand basic principles of dietary content, such as calories, fat, sodium, cholesterol and nutrients.       Nutrition Assessments:   Nutrition Goals Re-Evaluation: Nutrition Goals Re-Evaluation    [facility] Name 10/09/17 1402 11/02/17 1011           Goals   Current Weight  177 lb 11.2 oz (80.6 kg)  178 lb 8 oz (81 kg)      Nutrition Goal  For heart healthy choices add >50% of whole grains, make half their plate fruits and vegetables. Discuss the difference between starchy vegetables and leafy greens, and how leafy vegetables provide fiber, helps maintain healthy weight, helps control blood glucose, and lowers cholesterol.  Discuss purchasing fresh or frozen vegetable to reduce sodium and not to add grease, fat or sugar. Consume <18oz of red meat per week. Consume lean cuts of meats and very little of meats high in sodium and nitrates such as pork and lunch meats. Discussed portion control for all food groups.   For heart healthy choices add >50% of whole grains, make half their plate fruits and vegetables.  Discuss the difference between starchy vegetables and leafy greens, and how leafy vegetables provide fiber, helps maintain healthy weight, helps control blood glucose, and lowers cholesterol.  Discuss purchasing fresh or frozen vegetable to reduce sodium and not to add grease, fat or sugar. Consume <18oz of red meat per week. Consume lean cuts of meats and very little of meats high in sodium  and nitrates such as pork and lunch meats. Discussed portion control for all food groups.       Comment  Patient says he is eating a heart healthy diet.   Patient has gained one lb since his last 30 day review. He admitts to eating more pork lately which is causing elevated blood pressure. We told his son and he says he would monitor his diet more closely. Will continue to monitor.       Expected Outcome  Patient will continue to eat a heart healthy diet.   Patient will continue to eat a heart healthy diet.         Personal Goal #2 Re-Evaluation   Personal Goal #2  Patient's family (son and daughter) are helping him with a heart healthy diet.   Patient's family (son and daughter) are helping him with a heart healthy diet.          Nutrition Goals Discharge (Final Nutrition Goals Re-Evaluation): Nutrition Goals Re-Evaluation - 11/02/17 1011      Goals   Current Weight  178 lb 8 oz (81 kg)    Nutrition Goal  For heart healthy choices add >50% of whole grains, make half their plate fruits and vegetables. Discuss the difference between starchy vegetables and leafy greens, and how leafy vegetables provide fiber, helps maintain healthy weight, helps control blood glucose, and lowers cholesterol.  Discuss purchasing fresh or frozen vegetable to reduce sodium and not to add grease, fat or sugar. Consume <18oz of red meat per week. Consume lean cuts of meats and very little of meats high in sodium and nitrates such as pork and lunch meats. Discussed portion control for all food groups.     Comment  Patient has gained one  lb since his last 30 day review. He admitts to eating more pork lately which is causing elevated blood pressure. We told his son and he says he would monitor his diet more closely. Will continue to monitor.     Expected Outcome  Patient will continue to eat a heart healthy diet.       Personal Goal #2 Re-Evaluation   Personal Goal #2  Patient's family (son and daughter) are helping him with a heart healthy diet.        Psychosocial: Target Goals: Acknowledge presence or absence of significant depression and/or stress, maximize coping skills, provide positive support system. Participant is able to verbalize types and ability to use techniques and skills needed for reducing stress and depression.  Initial Review & Psychosocial Screening: Initial Psych Review & Screening - 08/24/17 1020      Initial Review   Current issues with  None Identified      Family Dynamics   Good Support System?  Yes      Barriers   Psychosocial barriers to participate in program  There are no identifiable barriers or psychosocial needs.      Screening Interventions   Interventions  Encouraged to exercise    Expected Outcomes  Short Term goal: Identification and review with participant of any Quality of Life or Depression concerns found by scoring the questionnaire.;Long Term goal: The participant improves quality of Life and PHQ9 Scores as seen by post scores and/or verbalization of changes       Quality of Life Scores: Quality of Life - 08/24/17 1005      Quality of Life Scores   Health/Function Pre  22.83 %    Socioeconomic Pre  23.88 %  Psych/Spiritual Pre  29.14 %    Family Pre  28.8 %    GLOBAL Pre  25.19 %      Scores of 19 and below usually indicate a poorer quality of life in these areas.  A difference of  2-3 points is a clinically meaningful difference.  A difference of 2-3 points in the total score of the Quality of Life Index has been associated with significant improvement in overall  quality of life, self-image, physical symptoms, and general health in studies assessing change in quality of life.  PHQ-9: Recent Review Flowsheet Data    Depression screen [facility] 2/9 08/24/2017   Decreased Interest 0   Down, Depressed, Hopeless 0   PHQ - 2 Score 0   Altered sleeping 0   Tired, decreased energy 0   Change in appetite 0   Feeling bad or failure about yourself  0   Trouble concentrating 0   Moving slowly or fidgety/restless 0   Suicidal thoughts 0   PHQ-9 Score 0   Difficult doing work/chores Not difficult at all     Interpretation of Total Score  Total Score Depression Severity:  1-4 = Minimal depression, 5-9 = Mild depression, 10-14 = Moderate depression, 15-19 = Moderately severe depression, 20-27 = Severe depression   Psychosocial Evaluation and Intervention: Psychosocial Evaluation - 08/24/17 1021      Psychosocial Evaluation & Interventions   Interventions  Encouraged to exercise with the program and follow exercise prescription    Continue Psychosocial Services   No Follow up required       Psychosocial Re-Evaluation: Psychosocial Re-Evaluation    [place] Name 09/20/17 1304 10/09/17 1406 11/02/17 1018         Psychosocial Re-Evaluation   Current issues with  None Identified  None Identified  None Identified     Comments  Patient's initial QOL score was 25.19 and his PHQ-9 score was 0 with no psychosocial issues identified.   Patient's initial QOL score was 25.19 and his PHQ-9 score was 0 with no psychosocial issues identified.   Patient's initial QOL score was 25.19 and his PHQ-9 score was 0 with no psychosocial issues identified.      Expected Outcomes  Patient will have no psychosocial issues identified at discharge.   Patient will have no psychosocial issues identified at discharge.   Patient will have no psychosocial issues identified at discharge.      Interventions  Stress management education;Encouraged to attend Cardiac Rehabilitation for the  exercise;Relaxation education  Stress management education;Encouraged to attend Cardiac Rehabilitation for the exercise;Relaxation education  Stress management education;Encouraged to attend Cardiac Rehabilitation for the exercise;Relaxation education     Continue Psychosocial Services   No Follow up required  No Follow up required  No Follow up required        Psychosocial Discharge (Final Psychosocial Re-Evaluation): Psychosocial Re-Evaluation - 11/02/17 1018      Psychosocial Re-Evaluation   Current issues with  None Identified    Comments  Patient's initial QOL score was 25.19 and his PHQ-9 score was 0 with no psychosocial issues identified.     Expected Outcomes  Patient will have no psychosocial issues identified at discharge.     Interventions  Stress management education;Encouraged to attend Cardiac Rehabilitation for the exercise;Relaxation education    Continue Psychosocial Services   No Follow up required       Vocational Rehabilitation: Provide vocational rehab assistance to qualifying candidates.   Vocational Rehab Evaluation & Intervention: Vocational Rehab -  08/24/17 1014      Initial Vocational Rehab Evaluation & Intervention   Assessment shows need for Vocational Rehabilitation  No       Education: Education Goals: Education classes will be provided on a weekly basis, covering required topics. Participant will state understanding/return demonstration of topics presented.  Learning Barriers/Preferences: Learning Barriers/Preferences - 08/24/17 1012      Learning Barriers/Preferences   Learning Barriers  Hearing    Learning Preferences  Video;Written Material;Pictoral;Computer/Internet       Education Topics: Hypertension, Hypertension Reduction -Define heart disease and high blood pressure. Discus how high blood pressure affects the body and ways to reduce high blood pressure.   CARDIAC REHAB PHASE II EXERCISE from 10/25/2017 in [place]  Date  09/27/17  Educator  Russella Dar  Instruction Review Code  2- Demonstrated Understanding      Exercise and Your Heart -Discuss why it is important to exercise, the FITT principles of exercise, normal and abnormal responses to exercise, and how to exercise safely.   CARDIAC REHAB PHASE II EXERCISE from 10/25/2017 in [place]  Date  10/04/17  Educator  [place]  Instruction Review Code  2- Demonstrated Understanding      Angina -Discuss definition of angina, causes of angina, treatment of angina, and how to decrease risk of having angina.   CARDIAC REHAB PHASE II EXERCISE from 10/25/2017 in [place]  Date  10/11/17  Educator  DC  Instruction Review Code  2- Demonstrated Understanding      Cardiac Medications -Review what the following cardiac medications are used for, how they affect the body, and side effects that may occur when taking the medications.  Medications include Aspirin, Beta blockers, calcium channel blockers, ACE Inhibitors, angiotensin receptor blockers, diuretics, digoxin, and antihyperlipidemics.   CARDIAC REHAB PHASE II EXERCISE from 10/25/2017 in [place]  Date  10/18/17  Educator  DC  Instruction Review Code  2- Demonstrated Understanding      Congestive Heart Failure -Discuss the definition of CHF, how to live with CHF, the signs and symptoms of CHF, and how keep track of weight and sodium intake.   CARDIAC REHAB PHASE II EXERCISE from 10/25/2017 in [place]  Date  10/25/17  Educator  DC  Instruction Review Code  2- Demonstrated Understanding      Heart Disease and Intimacy -Discus the effect sexual activity has on the heart, how changes occur during intimacy as we age, and safety during sexual activity.   CARDIAC REHAB PHASE II EXERCISE from 10/25/2017 in [place]  Date  08/30/17  Educator  DJ  Instruction Review Code  2-  Demonstrated Understanding      Smoking Cessation / COPD -Discuss different methods to quit smoking, the health benefits of quitting smoking, and the definition of COPD.   Nutrition I: Fats -Discuss the types of cholesterol, what cholesterol does to the heart, and how cholesterol levels can be controlled.   Nutrition II: Labels -Discuss the different components of food labels and how to read food label   Heart Parts/Heart Disease and PAD -Discuss the anatomy of the heart, the pathway of blood circulation through the heart, and these are affected by heart disease.   Stress I: Signs and Symptoms -Discuss the causes of stress, how stress may lead to anxiety and depression, and ways to limit stress.   CARDIAC REHAB PHASE II EXERCISE from 10/25/2017 in [place]  Date  09/06/17  Educator  [place]  Instruction Review Code  2- Demonstrated Understanding      Stress II: Relaxation -Discuss different types of relaxation techniques to limit stress.   CARDIAC REHAB PHASE II EXERCISE from 10/25/2017 in [place]  Date  09/13/17  Educator  DJ  Instruction Review Code  2- Demonstrated Understanding      Warning Signs of Stroke / TIA -Discuss definition of a stroke, what the signs and symptoms are of a stroke, and how to identify when someone is having stroke.   CARDIAC REHAB PHASE II EXERCISE from 10/25/2017 in [place]  Date  09/20/17  Educator  DC  Instruction Review Code  2- Demonstrated Understanding      Knowledge Questionnaire Score: Knowledge Questionnaire Score - 08/24/17 1014      Knowledge Questionnaire Score   Pre Score  20/24       Core Components/Risk Factors/Patient Goals at Admission: Personal Goals and Risk Factors at Admission - 08/24/17 1018      Core Components/Risk Factors/Patient Goals on Admission    Weight Management  Weight Maintenance    Personal Goal Other  Yes    Personal Goal   Become more heart healthy and breathe better.     Intervention  Attend CR 3 x week and supplement with home exercise 2 x week.    Expected Outcomes  Reach personal goals       Core Components/Risk Factors/Patient Goals Review:  Goals and Risk Factor Review    Row Name 09/20/17 1301 10/09/17 1404 11/02/17 1013         Core Components/Risk Factors/Patient Goals Review   Personal Goals Review  Weight Management/Obesity Breathe better; be heart healthy.   Weight Management/Obesity Breathe better; be heart healthy.  Weight Management/Obesity;Improve shortness of breath with ADL's Breathe better; be heart healthy.      Review  Patient has completed 12 sessions gaining 2.5 lbs since he started the program. He is doing well in the program with some progression. He says he feels the program has helped him so far with increased energy. He has not seen an improvement in his breathing. He hopes to see more improvement at he continues. Will continue to monitor for progress.   Patient has completed 20 sessions losing 1 lbs since his initial visit. He is doing well in the program with progression. He continues to say he feels stronger and he has more energy. He says he feels less SOB with activity. His balance has also improved along with his overall gait. He is now independent with the equipment. Will continue to monitor for progress.   Patient has completed 29 session maintaining his weight since his initial visit. He continues to do well in the program with progression. He does admitt to eating more pork lately and we have encouraged him to cut back becuase it is effecting his blood pressure. We also spoke to his son about this and he said he would monitor this more closley. Patient says he continues to feel stronger and his balance and walking have improved. He says he is noticing less SOB now with activity. He feels the program is helping him. Will continue to monitor for progress.      Expected Outcomes   Patient will continue to attend sessions and complete the program meeting his personal goals.   Patient will continue to attend sessions and complete the program meeting his personal goals.   Patient will continue  to attend sessions and complete the program meeting his personal goals.         Core Components/Risk Factors/Patient Goals at Discharge (Final Review):  Goals and Risk Factor Review - 11/02/17 1013      Core Components/Risk Factors/Patient Goals Review   Personal Goals Review  Weight Management/Obesity;Improve shortness of breath with ADL's Breathe better; be heart healthy.     Review  Patient has completed 29 session maintaining his weight since his initial visit. He continues to do well in the program with progression. He does admitt to eating more pork lately and we have encouraged him to cut back becuase it is effecting his blood pressure. We also spoke to his son about this and he said he would monitor this more closley. Patient says he continues to feel stronger and his balance and walking have improved. He says he is noticing less SOB now with activity. He feels the program is helping him. Will continue to monitor for progress.     Expected Outcomes  Patient will continue to attend sessions and complete the program meeting his personal goals.        ITP Comments: ITP Comments    Row Name 08/24/17 1016 08/24/17 1046 08/24/17 1247 09/21/17 1620     ITP Comments  Patient is been referred to Cardiac Rehab by Dr. Irish Lack due to NSTEMI and Stent placement.   Patient is beening referred to Cardiac Rehab by Dr. Irish Lack due to NSTEMI and Stent placement.   Patient new to program. Plans to start Monday 08/28/17. Will continue to monitor for progress.   Patient attend family matters class with hospital chaplain to discuss how this event has impacted their life and family.       Comments: ITP 30 Day REVIEW Patient doing well in the program. Will continue to monitor for progress.

## 2017-11-03 ENCOUNTER — Encounter (HOSPITAL_COMMUNITY)
Admission: RE | Admit: 2017-11-03 | Discharge: 2017-11-03 | Disposition: A | Discharge status: Home / Self Care | Payer: Medicare Other | Source: Ambulatory Visit | Attending: Cardiology | Admitting: Cardiology

## 2017-11-03 DIAGNOSIS — Z955 Presence of coronary angioplasty implant and graft: Secondary | ICD-10-CM

## 2017-11-03 DIAGNOSIS — I214 Non-ST elevation (NSTEMI) myocardial infarction: Secondary | ICD-10-CM | POA: Diagnosis not present

## 2017-11-03 NOTE — Progress Notes (Signed)
Daily Session Note  Patient Details  Name: Paul Snyder MRN: [id number] Date of Birth: 02/21/1936 Referring Provider:     CARDIAC REHAB PHASE II ORIENTATION from 08/24/2017 in [place]  Referring Provider  Dr. [name]      Encounter Date: 11/03/2017  Check In: Session Check In - 11/03/17 0815      Check-In   Location  [facility]    Staff Present  Russella Dar, MS, EP, [facility], Exercise Physiologist;Debra Wynetta Emery, RN, BSN;Sarthak Rubenstein, BS, EP, Exercise Physiologist    Supervising physician immediately available to respond to emergencies  See telemetry face sheet for immediately available MD    Medication changes reported      No    Fall or balance concerns reported     No    Warm-up and Cool-down  Performed as group-led instruction    Resistance Training Performed  Yes    VAD Patient?  No      Pain Assessment   Currently in Pain?  No/denies    Pain Score  0-No pain    Multiple Pain Sites  No       Capillary Blood Glucose: No results found for this or any previous visit (from the past 24 hour(s)).    Social History   Tobacco Use  Smoking Status Former Smoker  . Packs/day: 1.00  . Years: 42.00  . Pack years: 42.00  . Types: Cigarettes  . Last attempt to quit: 1996  . Years since quitting: 23.4  Smokeless Tobacco Never Used    Goals Met:  Independence with exercise equipment Exercise tolerated well No report of cardiac concerns or symptoms Strength training completed today  Goals Unmet:  Not Applicable  Comments: Check out 915   Dr. Kate Sable is Medical Director for [place] and Pulmonary Rehab.

## 2017-11-06 ENCOUNTER — Encounter (HOSPITAL_COMMUNITY)
Admission: RE | Admit: 2017-11-06 | Discharge: 2017-11-06 | Disposition: A | Discharge status: Home / Self Care | Payer: Medicare Other | Source: Ambulatory Visit | Attending: Cardiology | Admitting: Cardiology

## 2017-11-06 DIAGNOSIS — I214 Non-ST elevation (NSTEMI) myocardial infarction: Secondary | ICD-10-CM | POA: Diagnosis not present

## 2017-11-06 DIAGNOSIS — Z955 Presence of coronary angioplasty implant and graft: Secondary | ICD-10-CM

## 2017-11-06 NOTE — Progress Notes (Signed)
Daily Session Note  Patient Details  Name: JES COSTALES MRN: [id number] Date of Birth: [birth year] Referring Provider:     CARDIAC REHAB PHASE II ORIENTATION from 08/24/2017 in [place]  Referring Provider  Dr. [name]      Encounter Date: 11/06/2017  Check In: Session Check In - 11/06/17 0815      Check-In   Location  [facility]    Staff Present  Russella Dar, MS, EP, [facility], Exercise Physiologist;Debra Wynetta Emery, RN, BSN;Jyoti Harju, BS, EP, Exercise Physiologist    Supervising physician immediately available to respond to emergencies  See telemetry face sheet for immediately available MD    Medication changes reported      No    Fall or balance concerns reported     No    Warm-up and Cool-down  Performed as group-led instruction    Resistance Training Performed  Yes    VAD Patient?  No      Pain Assessment   Currently in Pain?  No/denies    Pain Score  0-No pain    Multiple Pain Sites  No       Capillary Blood Glucose: No results found for this or any previous visit (from the past 24 hour(s)).    Social History   Tobacco Use  Smoking Status Former Smoker  . Packs/day: 1.00  . Years: 42.00  . Pack years: 42.00  . Types: Cigarettes  . Last attempt to quit: 1996  . Years since quitting: 23.4  Smokeless Tobacco Never Used    Goals Met:  Independence with exercise equipment Exercise tolerated well No report of cardiac concerns or symptoms Strength training completed today  Goals Unmet:  Not Applicable  Comments: Check out 915   Dr. Kate Sable is Medical Director for [place] and Pulmonary Rehab.

## 2017-11-07 ENCOUNTER — Other Ambulatory Visit: Payer: Self-pay

## 2017-11-07 ENCOUNTER — Ambulatory Visit (INDEPENDENT_AMBULATORY_CARE_PROVIDER_SITE_OTHER): Payer: Medicare Other | Admitting: Cardiology

## 2017-11-07 ENCOUNTER — Encounter: Payer: Self-pay | Admitting: Cardiology

## 2017-11-07 VITALS — BP 156/70 | HR 70 | Ht 71.0 in | Wt 178.0 lb

## 2017-11-07 DIAGNOSIS — I2510 Atherosclerotic heart disease of native coronary artery without angina pectoris: Secondary | ICD-10-CM

## 2017-11-07 DIAGNOSIS — E782 Mixed hyperlipidemia: Secondary | ICD-10-CM | POA: Diagnosis not present

## 2017-11-07 DIAGNOSIS — I10 Essential (primary) hypertension: Secondary | ICD-10-CM

## 2017-11-07 MED ORDER — LISINOPRIL 10 MG PO TABS: 10.0000 mg | ORAL_TABLET | Freq: Every day | ORAL | 6 refills | Status: DC

## 2017-11-07 NOTE — Patient Instructions (Addendum)
Medication Instructions:   Stop Metoprolol.  Begin Lisinopril 10mg  daily.  Continue all other medications.     Labwork:  CMET, FLP - due in 2 weeks - orders given today.   Reminder:  Nothing to eat or drink after 12 midnight prior to labs.  Office will contact with results via phone or letter.    Testing/Procedures: none  Follow-Up: 4 months   Any Other Special Instructions Will Be Listed Below (If Applicable).  If you need a refill on your cardiac medications before your next appointment, please call your pharmacy.

## 2017-11-07 NOTE — Progress Notes (Signed)
Clinical Summary Paul Snyder is a 82 y.o.male seen for follow up of the following medical problems  1. CAD - admitted with NSTEMI 06/2017. Cath as reported below, received DES to LCX. - 06/2017 echo VLEF 55-60%, grade II diastolic dysfunciton - changed from brillinta to plavix due to cost   - low heart rates at times in cardiac rehab to low 40s. We stopped lopressor - no recent chest pain or SOB.   2. HTN - compliant with meds  3. Hyperlipidemia - changed from simva to atorva during 06/2017 admission - compliant with statin  4. COPD - followed by pcp  Past Medical History:  Diagnosis Date  . CHF (congestive heart failure) ([place])   . COPD (chronic obstructive pulmonary disease) ([place])   . GERD (gastroesophageal reflux disease)   . Hyperlipidemia   . Lung cancer ([place]) 2009  . NSTEMI (non-ST elevated myocardial infarction) ([place]) 07/16/2017   2/19 PCI/DES x1 to Lcx with moderate (50%) in the RCA, mild (20%) in LAD     Allergies  Allergen Reactions  . No Allergies On File      Current Outpatient Medications  Medication Sig Dispense Refill  . albuterol (PROVENTIL HFA;VENTOLIN HFA) 108 (90 Base) MCG/ACT inhaler Inhale 2 puffs into the lungs 4 (four) times daily.     Marland Kitchen aspirin EC 81 MG EC tablet Take 1 tablet (81 mg total) by mouth daily.    Marland Kitchen atorvastatin (LIPITOR) 80 MG tablet Take 1 tablet (80 mg total) by mouth daily at 6 PM. 90 tablet 3  . clopidogrel (PLAVIX) 75 MG tablet Take 75 mg by mouth daily.    . furosemide (LASIX) 20 MG tablet Take 20 mg by mouth daily.    . meclizine (ANTIVERT) 12.5 MG tablet Take 12.5 mg by mouth daily.  1  . metoprolol tartrate (LOPRESSOR) 25 MG tablet 10/31/17 HOLD for bradycardia 180 tablet 3  . niacin 500 MG tablet Take 500 mg by mouth daily.     . nitroGLYCERIN (NITROSTAT) 0.4 MG SL tablet Place 1 tablet (0.4 mg total) under the tongue every 5 (five) minutes x 3 doses as needed for chest pain. 25 tablet 2  . pantoprazole (PROTONIX) 40  MG tablet Take 40 mg by mouth daily.    . ticagrelor (BRILINTA) 90 MG TABS tablet Take 1 tablet (90 mg total) by mouth 2 (two) times daily. 180 tablet 2   No current facility-administered medications for this visit.      Past Surgical History:  Procedure Laterality Date  . ANKLE FRACTURE SURGERY Right 1970s  . CATARACT EXTRACTION Left   . CORONARY ANGIOPLASTY WITH STENT PLACEMENT  07/19/2017  . CORONARY STENT INTERVENTION N/A 07/19/2017   Procedure: CORONARY STENT INTERVENTION;  Surgeon: [name], MD;  Location: [facility];  Service: Cardiovascular;  Laterality: N/A;  . FRACTURE SURGERY    . LEFT HEART CATH AND CORONARY ANGIOGRAPHY N/A 07/19/2017   Procedure: LEFT HEART CATH AND CORONARY ANGIOGRAPHY;  Surgeon: [name], MD;  Location: [place] [facility];  Service: Cardiovascular;  Laterality: N/A;  . LUNG BIOPSY       Allergies  Allergen Reactions  . No Allergies On File       Family History  Problem Relation Age of Onset  . CAD Brother      Social History Paul Snyder reports that he quit smoking about 23 years ago. His smoking use included cigarettes. He has a 42.00 pack-year smoking history. He  has never used smokeless tobacco. Paul Snyder reports that he does not drink alcohol.   Review of Systems CONSTITUTIONAL: No weight loss, fever, chills, weakness or fatigue.  HEENT: Eyes: No visual loss, blurred vision, double vision or yellow sclerae.No hearing loss, sneezing, congestion, runny nose or sore throat.  SKIN: No rash or itching.  CARDIOVASCULAR: per hpi RESPIRATORY: per hpi GASTROINTESTINAL: No anorexia, nausea, vomiting or diarrhea. No abdominal pain or blood.  GENITOURINARY: No burning on urination, no polyuria NEUROLOGICAL: No headache, dizziness, syncope, paralysis, ataxia, numbness or tingling in the extremities. No change in bowel or bladder control.  MUSCULOSKELETAL: No muscle, back pain, joint pain or stiffness.  LYMPHATICS: No enlarged  nodes. No history of splenectomy.  PSYCHIATRIC: No history of depression or anxiety.  ENDOCRINOLOGIC: No reports of sweating, cold or heat intolerance. No polyuria or polydipsia.  Marland Kitchen   Physical Examination Vitals:   11/07/17 1121  BP: (!) 156/70  Pulse: 70  SpO2: 94%   Filed Weights   11/07/17 1121  Weight: 178 lb (80.7 kg)    Gen: resting comfortably, no acute distress HEENT: no scleral icterus, pupils equal round and reactive, no palptable cervical adenopathy,  CV Resp: Clear to auscultation bilaterally GI: abdomen is soft, non-tender, non-distended, normal bowel sounds, no hepatosplenomegaly MSK: extremities are warm, no edema.  Skin: warm, no rash Neuro:  no focal deficits Psych: appropriate affect   Diagnostic Studies 06/2017 cath  Prox Cx lesion is 30% stenosed.  Prox RCA to Mid RCA lesion is 55% stenosed.  Mid RCA lesion is 50% stenosed.  Prox LAD to Mid LAD lesion is 20% stenosed.  A stent was successfully placed.  Post intervention, there is a 0% residual stenosis.  Mid Cx lesion is 99% stenosed.   Multi vessel CAD with proximal irregularity of the LAD, 20% narrowing of the LAD in an intramyocardial segment beyond the diagonal vessel; mildly calcified proximal circumflex with 20-30% narrowing followed by a 99% stenosis after a bend in the mid vessel; 50-60% mid, and 50% stenosis at the acute margin of the RCA.  LVEDP 14 mmHg.  Successful percutaneous coronary intervention to the left circumflex vessel with ultimate insertion of a 3.016 mm Synergy DES stent postdilated 3.21 mm with a 99% stenosis being reduced to 0%.  RECOMMENDATION: DAPT for a minimum of 1 year.  Medical therapy for concomitant CAD.  06/2017 echo Study Conclusions  - Left ventricle: The cavity size was normal. Systolic function was   normal. The estimated ejection fraction was in the range of 55%   to 60%. Mild hypokinesis of the basal-midinferolateral   myocardium; consistent  with ischemia in the distribution of the   right coronary or left circumflex coronary artery. Features are   consistent with a pseudonormal left ventricular filling pattern,   with concomitant abnormal relaxation and increased filling   pressure (grade 2 diastolic dysfunction). - Mitral valve: Calcified annulus.    Assessment and Plan  1. CAD - no recent symptoms - we will stop his lopressor due to bradycardia noted in cardiac rehab to 40s - continue other meds  2. Hyperlipidemia - repeat CMET and FLP in 2 weeks, changed to high dose statin during admission 06/2017  3. HTN - above goal, start lisinopril 10mg  daily   F/u 4 months      Arnoldo Lenis, M.D

## 2017-11-08 ENCOUNTER — Encounter (HOSPITAL_COMMUNITY)
Admission: RE | Admit: 2017-11-08 | Discharge: 2017-11-08 | Disposition: A | Discharge status: Home / Self Care | Payer: Medicare Other | Source: Ambulatory Visit | Attending: Cardiology | Admitting: Cardiology

## 2017-11-08 DIAGNOSIS — Z955 Presence of coronary angioplasty implant and graft: Secondary | ICD-10-CM

## 2017-11-08 DIAGNOSIS — I214 Non-ST elevation (NSTEMI) myocardial infarction: Secondary | ICD-10-CM

## 2017-11-08 NOTE — Progress Notes (Signed)
Daily Session Note  Patient Details  Name: Paul Snyder MRN: [id number] Date of Birth: Jul 19, 1935 Referring Provider:     CARDIAC REHAB PHASE II ORIENTATION from 08/24/2017 in [place]  Referring Provider  Dr. [name]      Encounter Date: 11/08/2017  Check In: Session Check In - 11/08/17 0815      Check-In   Location  [facility]    Staff Present  Russella Dar, MS, EP, [facility], Exercise Physiologist;Clorinda Wyble Wynetta Emery, RN, BSN;Gregory Cowan, BS, EP, Exercise Physiologist    Supervising physician immediately available to respond to emergencies  See telemetry face sheet for immediately available MD    Medication changes reported      Yes    Comments  Patient saw his cardiologist yesterday 6/18. He discontinued Metoprolol and added Lisinopril 10 mg daily.    Fall or balance concerns reported     No    Warm-up and Cool-down  Performed as group-led instruction    Resistance Training Performed  Yes    VAD Patient?  No       Capillary Blood Glucose: No results found for this or any previous visit (from the past 24 hour(s)).    Social History   Tobacco Use  Smoking Status Former Smoker  . Packs/day: 1.00  . Years: 42.00  . Pack years: 42.00  . Types: Cigarettes  . Last attempt to quit: 1996  . Years since quitting: 23.4  Smokeless Tobacco Never Used    Goals Met:  Independence with exercise equipment Exercise tolerated well No report of cardiac concerns or symptoms Strength training completed today  Goals Unmet:  Not Applicable  Comments: Check out 915.   Dr. Kate Sable is Medical Director for [facility] Cardiac and Pulmonary Rehab.

## 2017-11-10 ENCOUNTER — Encounter (HOSPITAL_COMMUNITY)
Admission: RE | Admit: 2017-11-10 | Discharge: 2017-11-10 | Disposition: A | Discharge status: Home / Self Care | Payer: Medicare Other | Source: Ambulatory Visit | Attending: Cardiology | Admitting: Cardiology

## 2017-11-10 DIAGNOSIS — Z955 Presence of coronary angioplasty implant and graft: Secondary | ICD-10-CM

## 2017-11-10 DIAGNOSIS — I214 Non-ST elevation (NSTEMI) myocardial infarction: Secondary | ICD-10-CM | POA: Diagnosis not present

## 2017-11-10 NOTE — Progress Notes (Signed)
Daily Session Note  Patient Details  Name: Paul Snyder MRN: [id number] Date of Birth: 23-Sep-1935 Referring Provider:     [place] from 08/24/2017 in [place]  Referring Provider  Dr. [name]      Encounter Date: 11/10/2017  Check In: Session Check In - 11/10/17 0815      Check-In   Location  [facility]    Staff Present  Russella Dar, MS, EP, [facility], Exercise Physiologist;Antanette Richwine Wynetta Emery, RN, BSN;Gregory Cowan, BS, EP, Exercise Physiologist    Supervising physician immediately available to respond to emergencies  See telemetry face sheet for immediately available MD    Medication changes reported      No    Fall or balance concerns reported     No    Warm-up and Cool-down  Performed as group-led instruction    Resistance Training Performed  Yes    VAD Patient?  No      Pain Assessment   Currently in Pain?  No/denies    Pain Score  0-No pain    Multiple Pain Sites  No       Capillary Blood Glucose: No results found for this or any previous visit (from the past 24 hour(s)).    Social History   Tobacco Use  Smoking Status Former Smoker  . Packs/day: 1.00  . Years: 42.00  . Pack years: 42.00  . Types: Cigarettes  . Last attempt to quit: 1996  . Years since quitting: 23.4  Smokeless Tobacco Never Used    Goals Met:  Independence with exercise equipment Exercise tolerated well No report of cardiac concerns or symptoms Strength training completed today  Goals Unmet:  Not Applicable  Comments: Check out 915.   Dr. Kate Sable is Medical Director for [facility] Cardiac and Pulmonary Rehab.

## 2017-11-12 ENCOUNTER — Encounter: Payer: Self-pay | Admitting: Cardiology

## 2017-11-13 ENCOUNTER — Encounter (HOSPITAL_COMMUNITY)
Admission: RE | Admit: 2017-11-13 | Discharge: 2017-11-13 | Disposition: A | Discharge status: Home / Self Care | Payer: Medicare Other | Source: Ambulatory Visit | Attending: Cardiology | Admitting: Cardiology

## 2017-11-13 DIAGNOSIS — Z955 Presence of coronary angioplasty implant and graft: Secondary | ICD-10-CM

## 2017-11-13 DIAGNOSIS — I214 Non-ST elevation (NSTEMI) myocardial infarction: Secondary | ICD-10-CM

## 2017-11-13 NOTE — Progress Notes (Signed)
Daily Session Note  Patient Details  Name: Valdez J Cuello MRN: [id number] Date of Birth: 04/09/1936 Referring Provider:     CARDIAC REHAB PHASE II ORIENTATION from 08/24/2017 in [place] [facility]  Referring Provider  Dr. Branch      Encounter Date: 11/13/2017  Check In: Session Check In - 11/13/17 0815      Check-In   Location  [facility]    Staff Present  Diane Coad, MS, EP, [facility], Exercise Physiologist;Debra Johnson, RN, BSN;Gregory Cowan, BS, EP, Exercise Physiologist    Supervising physician immediately available to respond to emergencies  See telemetry face sheet for immediately available MD    Medication changes reported      No    Fall or balance concerns reported     No    Warm-up and Cool-down  Performed as group-led instruction    Resistance Training Performed  Yes    VAD Patient?  No      Pain Assessment   Currently in Pain?  No/denies    Pain Score  0-No pain    Multiple Pain Sites  No       Capillary Blood Glucose: No results found for this or any previous visit (from the past 24 hour(s)).    Social History   Tobacco Use  Smoking Status Former Smoker  . Packs/day: 1.00  . Years: 42.00  . Pack years: 42.00  . Types: Cigarettes  . Last attempt to quit: 1996  . Years since quitting: 23.4  Smokeless Tobacco Never Used    Goals Met:  Independence with exercise equipment Exercise tolerated well No report of cardiac concerns or symptoms Strength training completed today  Goals Unmet:  Not Applicable  Comments: Check out 915.   Dr. Suresh Koneswaran is Medical Director for [place] Cardiac and Pulmonary Rehab. 

## 2017-11-15 ENCOUNTER — Encounter (HOSPITAL_COMMUNITY)
Admission: RE | Admit: 2017-11-15 | Discharge: 2017-11-15 | Disposition: A | Discharge status: Home / Self Care | Payer: Medicare Other | Source: Ambulatory Visit | Attending: Cardiology | Admitting: Cardiology

## 2017-11-15 DIAGNOSIS — I214 Non-ST elevation (NSTEMI) myocardial infarction: Secondary | ICD-10-CM | POA: Diagnosis not present

## 2017-11-15 DIAGNOSIS — Z955 Presence of coronary angioplasty implant and graft: Secondary | ICD-10-CM

## 2017-11-15 NOTE — Progress Notes (Signed)
Daily Session Note  Patient Details  Name: Paul Snyder MRN: [id number] Date of Birth: March 02, 1936 Referring Provider:     [facility] from 08/24/2017 in [place]  Referring Provider  Dr. [name]      Encounter Date: 11/15/2017  Check In: Session Check In - 11/15/17 0815      Check-In   Location  [facility]    Staff Present  Diane Angelina Pih, MS, EP, [facility], Exercise Physiologist;Timberly Yott Wynetta Emery, RN, BSN;Gregory Cowan, BS, EP, Exercise Physiologist    Supervising physician immediately available to respond to emergencies  See telemetry face sheet for immediately available MD    Medication changes reported      No    Warm-up and Cool-down  Performed as group-led instruction    Resistance Training Performed  Yes    VAD Patient?  No    PAD/SET Patient?  No      Pain Assessment   Currently in Pain?  No/denies    Pain Score  0-No pain    Multiple Pain Sites  No       Capillary Blood Glucose: No results found for this or any previous visit (from the past 24 hour(s)).    Social History   Tobacco Use  Smoking Status Former Smoker  . Packs/day: 1.00  . Years: 42.00  . Pack years: 42.00  . Types: Cigarettes  . Last attempt to quit: 1996  . Years since quitting: 23.4  Smokeless Tobacco Never Used    Goals Met:  Independence with exercise equipment Exercise tolerated well No report of cardiac concerns or symptoms Strength training completed today  Goals Unmet:  Not Applicable  Comments: Check out 915.   Dr. Kate Sable is Medical Director for [facility].

## 2017-11-17 ENCOUNTER — Encounter (HOSPITAL_COMMUNITY)
Admission: RE | Admit: 2017-11-17 | Discharge: 2017-11-17 | Disposition: A | Discharge status: Home / Self Care | Payer: Medicare Other | Source: Ambulatory Visit | Attending: Cardiology | Admitting: Cardiology

## 2017-11-17 VITALS — Ht 72.0 in | Wt 178.7 lb

## 2017-11-17 DIAGNOSIS — Z955 Presence of coronary angioplasty implant and graft: Secondary | ICD-10-CM

## 2017-11-17 DIAGNOSIS — I214 Non-ST elevation (NSTEMI) myocardial infarction: Secondary | ICD-10-CM | POA: Diagnosis not present

## 2017-11-17 NOTE — Progress Notes (Signed)
Daily Session Note  Patient Details  Name: Paul Snyder MRN: [id number] Date of Birth: 1935/12/22 Referring Provider:     CARDIAC REHAB PHASE II ORIENTATION from 08/24/2017 in [place]  Referring Provider  Dr. [name]      Encounter Date: 11/17/2017  Check In: Session Check In - 11/17/17 0815      Check-In   Location  [facility]    Staff Present  Aundra Dubin, RN, BSN;Gregory Cowan, BS, EP, Exercise Physiologist    Medication changes reported      No    Fall or balance concerns reported     No    Warm-up and Cool-down  Performed as group-led instruction    Resistance Training Performed  Yes    VAD Patient?  No    PAD/SET Patient?  No      Pain Assessment   Currently in Pain?  No/denies    Pain Score  0-No pain    Multiple Pain Sites  No       Capillary Blood Glucose: No results found for this or any previous visit (from the past 24 hour(s)).    Social History   Tobacco Use  Smoking Status Former Smoker  . Packs/day: 1.00  . Years: 42.00  . Pack years: 42.00  . Types: Cigarettes  . Last attempt to quit: 1996  . Years since quitting: 23.5  Smokeless Tobacco Never Used    Goals Met:  Independence with exercise equipment Exercise tolerated well No report of cardiac concerns or symptoms Strength training completed today  Goals Unmet:  Not Applicable  Comments: Check out 915.   Dr. Kate Sable is Medical Director for [facility].

## 2017-11-29 NOTE — Progress Notes (Signed)
Cardiac Individual Treatment Plan  Patient Details  Name: TACARI REPASS MRN: [id number] Date of Birth: 12-23-1935 Referring Provider:     CARDIAC REHAB PHASE II ORIENTATION from 08/24/2017 in [place]  Referring Provider  Dr. [name]      Initial Encounter Date:    CARDIAC REHAB PHASE II ORIENTATION from 08/24/2017 in [place]  Date  08/24/17      Visit Diagnosis: NSTEMI (non-ST elevated myocardial infarction) [facility])  Status post coronary artery stent placement  Patient's Home Medications on Admission:  Current Outpatient Medications:  .  albuterol (PROVENTIL HFA;VENTOLIN HFA) 108 (90 Base) MCG/ACT inhaler, Inhale 2 puffs into the lungs 4 (four) times daily. , Disp: , Rfl:  .  aspirin EC 81 MG EC tablet, Take 1 tablet (81 mg total) by mouth daily., Disp: , Rfl:  .  atorvastatin (LIPITOR) 80 MG tablet, Take 1 tablet (80 mg total) by mouth daily at 6 PM., Disp: 90 tablet, Rfl: 3 .  clopidogrel (PLAVIX) 75 MG tablet, Take 75 mg by mouth daily., Disp: , Rfl:  .  furosemide (LASIX) 20 MG tablet, Take 20 mg by mouth daily., Disp: , Rfl:  .  lisinopril (PRINIVIL,ZESTRIL) 10 MG tablet, Take 1 tablet (10 mg total) by mouth daily., Disp: 30 tablet, Rfl: 6 .  meclizine (ANTIVERT) 12.5 MG tablet, Take 12.5 mg by mouth daily., Disp: , Rfl: 1 .  niacin 500 MG tablet, Take 500 mg by mouth daily. , Disp: , Rfl:  .  nitroGLYCERIN (NITROSTAT) 0.4 MG SL tablet, Place 1 tablet (0.4 mg total) under the tongue every 5 (five) minutes x 3 doses as needed for chest pain., Disp: 25 tablet, Rfl: 2 .  pantoprazole (PROTONIX) 40 MG tablet, Take 40 mg by mouth daily., Disp: , Rfl:   Past Medical History: Past Medical History:  Diagnosis Date  . CHF (congestive heart failure) ([place])   . COPD (chronic obstructive pulmonary disease) ([place])   . GERD (gastroesophageal reflux disease)   . Hyperlipidemia   . Lung cancer ([place]) 2009  . NSTEMI (non-ST elevated myocardial  infarction) ([place]) 07/16/2017   2/19 PCI/DES x1 to Lcx with moderate (50%) in the RCA, mild (20%) in LAD    Tobacco Use: Social History   Tobacco Use  Smoking Status Former Smoker  . Packs/day: 1.00  . Years: 42.00  . Pack years: 42.00  . Types: Cigarettes  . Last attempt to quit: 1996  . Years since quitting: 23.5  Smokeless Tobacco Never Used    Labs: Recent Review Scientist, physiological    Labs for ITP Cardiac and Pulmonary Rehab Latest Ref Rng & Units 07/20/2017   Cholestrol 0 - 200 mg/dL 126   LDLCALC 0 - 99 mg/dL 71   HDL >40 mg/dL 32(L)   Trlycerides <150 mg/dL 117   Hemoglobin A1c 4.8 - 5.6 % 5.8(H)      Capillary Blood Glucose: Lab Results  Component Value Date   GLUCAP 94 01/21/2009     Exercise Target Goals:    Exercise Program Goal: Individual exercise prescription set using results from initial 6 min walk test and THRR while considering  patient's activity barriers and safety.   Exercise Prescription Goal: Starting with aerobic activity 30 plus minutes a day, 3 days per week for initial exercise prescription. Provide home exercise prescription and guidelines that participant acknowledges understanding prior to discharge.  Activity Barriers & Risk Stratification: Activity Barriers & Cardiac Risk Stratification - 08/24/17 1002  Activity Barriers & Cardiac Risk Stratification   Activity Barriers  Shortness of Breath;Deconditioning    Cardiac Risk Stratification  High       6 Minute Walk: 6 Minute Walk    Row Name 08/24/17 1002 11/17/17 1427       6 Minute Walk   Phase  Initial  Discharge    Distance  850 feet  850 feet    Distance % Change  0 %  0 %    Distance Feet Change  0 ft  0 ft    Walk Time  6 minutes  6 minutes    # of Rest Breaks  0  0    MPH  1.6  1.6    METS  2.23  2.23    RPE  12  11    Perceived Dyspnea   11  12    VO2 Peak  6.16  6.56    Symptoms  No  No    Resting HR  51 bpm  68 bpm    Resting BP  100/50  110/58    Resting  Oxygen Saturation   95 %  95 %    Exercise Oxygen Saturation  during 6 min walk  93 %  87 %    Max Ex. HR  89 bpm  105 bpm    Max Ex. BP  128/58  126/70    2 Minute Post BP  106/52  108/60       Oxygen Initial Assessment:   Oxygen Re-Evaluation:   Oxygen Discharge (Final Oxygen Re-Evaluation):   Initial Exercise Prescription: Initial Exercise Prescription - 08/24/17 1000      Date of Initial Exercise RX and Referring Provider   Date  08/24/17    Referring Provider  Dr. [name]      Treadmill   MPH  0.8    Grade  0    Minutes  15    METs  1.6      NuStep   Level  1    SPM  56    Minutes  20    METs  1.6      Prescription Details   Frequency (times per week)  3    Duration  Progress to 30 minutes of continuous aerobic without signs/symptoms of physical distress      Intensity   THRR 40-80% of Max Heartrate  [phone]    Ratings of Perceived Exertion  11-13    Perceived Dyspnea  0-4      Progression   Progression  Continue progressive overload as per policy without signs/symptoms or physical distress.      Resistance Training   Training Prescription  Yes    Weight  1    Reps  10-15       Perform Capillary Blood Glucose checks as needed.  Exercise Prescription Changes:  Exercise Prescription Changes    Row Name 09/11/17 1500 09/26/17 0700 10/06/17 1400 10/20/17 1400 11/06/17 1400     Response to Exercise   Blood Pressure (Admit)  114/62  120/50  124/72  128/70  140/60   Blood Pressure (Exercise)  100/54  104/50  128/72  140/60  126/60   Blood Pressure (Exit)  114/50  110/50  110/50  110/56  136/70   Heart Rate (Admit)  77 bpm  55 bpm  46 bpm  46 bpm  61 bpm   Heart Rate (Exercise)  85 bpm  65 bpm  67 bpm  83 bpm  98 bpm   Heart Rate (Exit)  66 bpm  59 bpm  55 bpm  55 bpm  82 bpm   Rating of Perceived Exertion (Exercise)  10  10  10  11  11    Duration  Progress to 30 minutes of  aerobic without signs/symptoms of physical distress  Progress to 30  minutes of  aerobic without signs/symptoms of physical distress  Progress to 30 minutes of  aerobic without signs/symptoms of physical distress  Progress to 30 minutes of  aerobic without signs/symptoms of physical distress  Progress to 30 minutes of  aerobic without signs/symptoms of physical distress   Intensity  THRR New 102-115-127  THRR New 89-106-123  THRR New 84-102-121  THRR New 84-103-121  THRR New 93-108-124     Progression   Progression  Continue to progress workloads to maintain intensity without signs/symptoms of physical distress.  Continue to progress workloads to maintain intensity without signs/symptoms of physical distress.  Continue to progress workloads to maintain intensity without signs/symptoms of physical distress.  Continue to progress workloads to maintain intensity without signs/symptoms of physical distress.  Continue to progress workloads to maintain intensity without signs/symptoms of physical distress.     Resistance Training   Training Prescription  Yes  Yes  Yes  Yes  Yes   Weight  1  2  2  2  3    Reps  10-15  10-15  10-15  10-15  10-15     Treadmill   MPH  1  1.1  1.2  1.3  1.5   Grade  0  0  0  0  0   Minutes  15  15  15  15  15    METs  1.7  1.8  1.9  1.9  2.1     NuStep   Level  1  2  2  2  2    SPM  97  89  98  102  106   Minutes  20  20  20  20  20    METs  1.9  1.8  1.8  1.8  1.8     Home Exercise Plan   Plans to continue exercise at  -  Home (comment)  Home (comment)  Home (comment)  Home (comment)   Frequency  -  Add 2 additional days to program exercise sessions.  Add 2 additional days to program exercise sessions.  Add 2 additional days to program exercise sessions.  Add 2 additional days to program exercise sessions.   Initial Home Exercises Provided  -  09/25/17  09/25/17  09/25/17  09/25/17      Exercise Comments:  Exercise Comments    Row Name 09/11/17 1507 09/26/17 0744 10/06/17 1459 10/20/17 1429 11/06/17 1449   Exercise Comments   Patient is doing very well in CR and has been very productive on the treadmill as well as the nustep machines. Patient has increased his speed on the treadmill to 1.0 and has increased his watts on the nustep to 97. Patient is still only on his eigth session and has been doing wel. He will be monitored throughout the remainder of the program.   Patient is doing very well in CR. Patient has increased his level on the nustep machine and as well his speed on the treadmill. Patient has had no complaints since starting the program and seems to be enjoying the company of others. Patient does not complain of SOB while on his machines which was  one of his goals from the beginning of the program.   Patient continues to do well in CR. Patient has increased his speed on the treadmill to 1.2. Patient has also increased his SPMs on the nustep to 98. Patient has increased his distance to about a mile each time on the nustep as well. Patient stated that he feels stronger since beginning the program and it is evident through his progressions that it is true.   Patient is doing well in CR. patient has increased his speed on the treadmill to 1.3 and has also maintained his level on the nustep machine and the SPMs. Patient has stated to me that he feels more energy and strength from the program.   Patient is doing very well in CR. Patient has increased his speed on teh treadmill to 1.5 and has also increased the size of his dumbbells to 3lbs. Patient has truly enjoyed coming here to CR.       Exercise Goals and Review:  Exercise Goals    Row Name 08/24/17 1004             Exercise Goals   Increase Physical Activity  Yes       Intervention  Develop an individualized exercise prescription for aerobic and resistive training based on initial evaluation findings, risk stratification, comorbidities and participant's personal goals.;Provide advice, education, support and counseling about physical activity/exercise needs.        Expected Outcomes  Short Term: Attend rehab on a regular basis to increase amount of physical activity.;Long Term: Exercising regularly at least 3-5 days a week.;Long Term: Add in home exercise to make exercise part of routine and to increase amount of physical activity.       Increase Strength and Stamina  Yes       Intervention  Develop an individualized exercise prescription for aerobic and resistive training based on initial evaluation findings, risk stratification, comorbidities and participant's personal goals.;Provide advice, education, support and counseling about physical activity/exercise needs.       Expected Outcomes  Short Term: Increase workloads from initial exercise prescription for resistance, speed, and METs.       Able to understand and use rate of perceived exertion (RPE) scale  Yes       Intervention  Provide education and explanation on how to use RPE scale       Expected Outcomes  Short Term: Able to use RPE daily in rehab to express subjective intensity level;Long Term:  Able to use RPE to guide intensity level when exercising independently       Able to understand and use Dyspnea scale  Yes       Intervention  Provide education and explanation on how to use Dyspnea scale       Expected Outcomes  Short Term: Able to use Dyspnea scale daily in rehab to express subjective sense of shortness of breath during exertion;Long Term: Able to use Dyspnea scale to guide intensity level when exercising independently       Knowledge and understanding of Target Heart Rate Range (THRR)  Yes       Intervention  Provide education and explanation of THRR including how the numbers were predicted and where they are located for reference       Expected Outcomes  Short Term: Able to state/look up THRR;Long Term: Able to use THRR to govern intensity when exercising independently;Short Term: Able to use daily as guideline for intensity in rehab  Able to check pulse independently  Yes        Intervention  Provide education and demonstration on how to check pulse in carotid and radial arteries.;Review the importance of being able to check your own pulse for safety during independent exercise       Expected Outcomes  Short Term: Able to explain why pulse checking is important during independent exercise;Long Term: Able to check pulse independently and accurately       Understanding of Exercise Prescription  Yes       Intervention  Provide education, explanation, and written materials on patient's individual exercise prescription       Expected Outcomes  Short Term: Able to explain program exercise prescription;Long Term: Able to explain home exercise prescription to exercise independently          Exercise Goals Re-Evaluation : Exercise Goals Re-Evaluation    Row Name 09/11/17 1505 10/06/17 1457 10/30/17 1511         Exercise Goal Re-Evaluation   Exercise Goals Review  Increase Physical Activity;Increase Strength and Stamina;Able to understand and use Dyspnea scale;Able to check pulse independently;Able to understand and use rate of perceived exertion (RPE) scale;Knowledge and understanding of Target Heart Rate Range (THRR);Understanding of Exercise Prescription  Increase Physical Activity;Increase Strength and Stamina;Able to understand and use Dyspnea scale;Able to check pulse independently;Able to understand and use rate of perceived exertion (RPE) scale;Knowledge and understanding of Target Heart Rate Range (THRR);Understanding of Exercise Prescription  Increase Physical Activity;Increase Strength and Stamina;Able to understand and use Dyspnea scale;Able to check pulse independently;Able to understand and use rate of perceived exertion (RPE) scale;Knowledge and understanding of Target Heart Rate Range (THRR);Understanding of Exercise Prescription     Comments  Patient is doing very well in CR and has been very productive on the treadmill as well as the nustep machines. Patient has  increased his speed on the treadmill to 1.0 and has increased his watts on the nustep to 97. Patient is still only on his eigth session and has been doing wel. He will be monitored throughout the remainder of the program.   Patient continues to do well in CR. Patient has increased his speed on the treadmill to 1.2. Patient has also increased his SPMs on the nustep to 98. Patient has increased his distance to about a mile each time on the nustep as well. Patient stated that he feels stronger since beginning the program and it is evident through his progressions that it is true.   Patient is doing well in CR. Patient states that his is helping him breathe better and has much more endurance and strength. Patient looks better overall.      Expected Outcomes  Patient wishes to become heart healthy and be able to breathe better.   Patient wishes to become heart healthy and be able to breathe better.   Patient wishes to become heart healthy and be able to breathe better.          Discharge Exercise Prescription (Final Exercise Prescription Changes): Exercise Prescription Changes - 11/06/17 1400      Response to Exercise   Blood Pressure (Admit)  140/60    Blood Pressure (Exercise)  126/60    Blood Pressure (Exit)  136/70    Heart Rate (Admit)  61 bpm    Heart Rate (Exercise)  98 bpm    Heart Rate (Exit)  82 bpm    Rating of Perceived Exertion (Exercise)  11    Duration  Progress  to 30 minutes of  aerobic without signs/symptoms of physical distress    Intensity  THRR New [id number]      Progression   Progression  Continue to progress workloads to maintain intensity without signs/symptoms of physical distress.      Resistance Training   Training Prescription  Yes    Weight  3    Reps  10-15      Treadmill   MPH  1.5    Grade  0    Minutes  15    METs  2.1      NuStep   Level  2    SPM  106    Minutes  20    METs  1.8      Home Exercise Plan   Plans to continue exercise at  Home  (comment)    Frequency  Add 2 additional days to program exercise sessions.    Initial Home Exercises Provided  09/25/17       Nutrition:  Target Goals: Understanding of nutrition guidelines, daily intake of sodium 1500mg , cholesterol 200mg , calories 30% from fat and 7% or less from saturated fats, daily to have 5 or more servings of fruits and vegetables.  Biometrics: Pre Biometrics - 08/24/17 1004      Pre Biometrics   Height  6' (1.829 m)    Weight  178 lb 3.2 oz (80.8 kg)    Waist Circumference  41.5 inches    Hip Circumference  39 inches    Waist to Hip Ratio  1.06 %    BMI (Calculated)  24.16    Triceps Skinfold  8 mm    % Body Fat  24.6 %    Grip Strength  61.26 kg    Flexibility  0 in    Single Leg Stand  1 seconds      Post Biometrics - 11/17/17 1428       Post  Biometrics   Height  6' (1.829 m)    Weight  179 lb 0.2 oz (81.2 kg)    Waist Circumference  40 inches    Hip Circumference  39 inches    Waist to Hip Ratio  1.03 %    BMI (Calculated)  24.27    Triceps Skinfold  8 mm    % Body Fat  23.9 %    Grip Strength  88 kg    Flexibility  0 in    Single Leg Stand  1 seconds       Nutrition Therapy Plan and Nutrition Goals: Nutrition Therapy & Goals - 09/21/17 1619      Personal Nutrition Goals   Nutrition Goal  For heart healthy choices add >50% of whole grains, make half their plate fruits and vegetables. Discuss the difference between starchy vegetables and leafy greens, and how leafy vegetables provide fiber, helps maintain healthy weight, helps control blood glucose, and lowers cholesterol.  Discuss purchasing fresh or frozen vegetable to reduce sodium and not to add grease, fat or sugar. Consume <18oz of red meat per week. Consume lean cuts of meats and very little of meats high in sodium and nitrates such as pork and lunch meats. Discussed portion control for all food groups.     Additional Goals?  No      Intervention Plan   Intervention  Nutrition  handout(s) given to patient.    Expected Outcomes  Short Term Goal: Understand basic principles of dietary content, such as calories, fat, sodium, cholesterol and nutrients.  Nutrition Assessments:   Nutrition Goals Re-Evaluation: Nutrition Goals Re-Evaluation    [place] Name 10/09/17 1402 11/02/17 1011           Goals   Current Weight  177 lb 11.2 oz (80.6 kg)  178 lb 8 oz (81 kg)      Nutrition Goal  For heart healthy choices add >50% of whole grains, make half their plate fruits and vegetables. Discuss the difference between starchy vegetables and leafy greens, and how leafy vegetables provide fiber, helps maintain healthy weight, helps control blood glucose, and lowers cholesterol.  Discuss purchasing fresh or frozen vegetable to reduce sodium and not to add grease, fat or sugar. Consume <18oz of red meat per week. Consume lean cuts of meats and very little of meats high in sodium and nitrates such as pork and lunch meats. Discussed portion control for all food groups.   For heart healthy choices add >50% of whole grains, make half their plate fruits and vegetables. Discuss the difference between starchy vegetables and leafy greens, and how leafy vegetables provide fiber, helps maintain healthy weight, helps control blood glucose, and lowers cholesterol.  Discuss purchasing fresh or frozen vegetable to reduce sodium and not to add grease, fat or sugar. Consume <18oz of red meat per week. Consume lean cuts of meats and very little of meats high in sodium and nitrates such as pork and lunch meats. Discussed portion control for all food groups.       Comment  Patient says he is eating a heart healthy diet.   Patient has gained one lb since his last 30 day review. He admitts to eating more pork lately which is causing elevated blood pressure. We told his son and he says he would monitor his diet more closely. Will continue to monitor.       Expected Outcome  Patient will continue to eat a heart  healthy diet.   Patient will continue to eat a heart healthy diet.         Personal Goal #2 Re-Evaluation   Personal Goal #2  Patient's family (son and daughter) are helping him with a heart healthy diet.   Patient's family (son and daughter) are helping him with a heart healthy diet.          Nutrition Goals Discharge (Final Nutrition Goals Re-Evaluation): Nutrition Goals Re-Evaluation - 11/02/17 1011      Goals   Current Weight  178 lb 8 oz (81 kg)    Nutrition Goal  For heart healthy choices add >50% of whole grains, make half their plate fruits and vegetables. Discuss the difference between starchy vegetables and leafy greens, and how leafy vegetables provide fiber, helps maintain healthy weight, helps control blood glucose, and lowers cholesterol.  Discuss purchasing fresh or frozen vegetable to reduce sodium and not to add grease, fat or sugar. Consume <18oz of red meat per week. Consume lean cuts of meats and very little of meats high in sodium and nitrates such as pork and lunch meats. Discussed portion control for all food groups.     Comment  Patient has gained one lb since his last 30 day review. He admitts to eating more pork lately which is causing elevated blood pressure. We told his son and he says he would monitor his diet more closely. Will continue to monitor.     Expected Outcome  Patient will continue to eat a heart healthy diet.       Personal Goal #2 Re-Evaluation  Personal Goal #2  Patient's family (son and daughter) are helping him with a heart healthy diet.        Psychosocial: Target Goals: Acknowledge presence or absence of significant depression and/or stress, maximize coping skills, provide positive support system. Participant is able to verbalize types and ability to use techniques and skills needed for reducing stress and depression.  Initial Review & Psychosocial Screening: Initial Psych Review & Screening - 08/24/17 1020      Initial Review   Current  issues with  None Identified      Family Dynamics   Good Support System?  Yes      Barriers   Psychosocial barriers to participate in program  There are no identifiable barriers or psychosocial needs.      Screening Interventions   Interventions  Encouraged to exercise    Expected Outcomes  Short Term goal: Identification and review with participant of any Quality of Life or Depression concerns found by scoring the questionnaire.;Long Term goal: The participant improves quality of Life and PHQ9 Scores as seen by post scores and/or verbalization of changes       Quality of Life Scores: Quality of Life - 08/24/17 1005      Quality of Life Scores   Health/Function Pre  22.83 %    Socioeconomic Pre  23.88 %    Psych/Spiritual Pre  29.14 %    Family Pre  28.8 %    GLOBAL Pre  25.19 %      Scores of 19 and below usually indicate a poorer quality of life in these areas.  A difference of  2-3 points is a clinically meaningful difference.  A difference of 2-3 points in the total score of the Quality of Life Index has been associated with significant improvement in overall quality of life, self-image, physical symptoms, and general health in studies assessing change in quality of life.  PHQ-9: Recent Review Flowsheet Data    Depression screen [facility] 2/9 08/24/2017   Decreased Interest 0   Down, Depressed, Hopeless 0   PHQ - 2 Score 0   Altered sleeping 0   Tired, decreased energy 0   Change in appetite 0   Feeling bad or failure about yourself  0   Trouble concentrating 0   Moving slowly or fidgety/restless 0   Suicidal thoughts 0   PHQ-9 Score 0   Difficult doing work/chores Not difficult at all     Interpretation of Total Score  Total Score Depression Severity:  1-4 = Minimal depression, 5-9 = Mild depression, 10-14 = Moderate depression, 15-19 = Moderately severe depression, 20-27 = Severe depression   Psychosocial Evaluation and Intervention: Psychosocial Evaluation - 08/24/17  1021      Psychosocial Evaluation & Interventions   Interventions  Encouraged to exercise with the program and follow exercise prescription    Continue Psychosocial Services   No Follow up required       Psychosocial Re-Evaluation: Psychosocial Re-Evaluation    [place] Name 09/20/17 1304 10/09/17 1406 11/02/17 1018         Psychosocial Re-Evaluation   Current issues with  None Identified  None Identified  None Identified     Comments  Patient's initial QOL score was 25.19 and his PHQ-9 score was 0 with no psychosocial issues identified.   Patient's initial QOL score was 25.19 and his PHQ-9 score was 0 with no psychosocial issues identified.   Patient's initial QOL score was 25.19 and his PHQ-9 score was 0  with no psychosocial issues identified.      Expected Outcomes  Patient will have no psychosocial issues identified at discharge.   Patient will have no psychosocial issues identified at discharge.   Patient will have no psychosocial issues identified at discharge.      Interventions  Stress management education;Encouraged to attend Cardiac Rehabilitation for the exercise;Relaxation education  Stress management education;Encouraged to attend Cardiac Rehabilitation for the exercise;Relaxation education  Stress management education;Encouraged to attend Cardiac Rehabilitation for the exercise;Relaxation education     Continue Psychosocial Services   No Follow up required  No Follow up required  No Follow up required        Psychosocial Discharge (Final Psychosocial Re-Evaluation): Psychosocial Re-Evaluation - 11/02/17 1018      Psychosocial Re-Evaluation   Current issues with  None Identified    Comments  Patient's initial QOL score was 25.19 and his PHQ-9 score was 0 with no psychosocial issues identified.     Expected Outcomes  Patient will have no psychosocial issues identified at discharge.     Interventions  Stress management education;Encouraged to attend Cardiac Rehabilitation for the  exercise;Relaxation education    Continue Psychosocial Services   No Follow up required       Vocational Rehabilitation: Provide vocational rehab assistance to qualifying candidates.   Vocational Rehab Evaluation & Intervention: Vocational Rehab - 08/24/17 1014      Initial Vocational Rehab Evaluation & Intervention   Assessment shows need for Vocational Rehabilitation  No       Education: Education Goals: Education classes will be provided on a weekly basis, covering required topics. Participant will state understanding/return demonstration of topics presented.  Learning Barriers/Preferences: Learning Barriers/Preferences - 08/24/17 1012      Learning Barriers/Preferences   Learning Barriers  Hearing    Learning Preferences  Video;Written Material;Pictoral;Computer/Internet       Education Topics: Hypertension, Hypertension Reduction -Define heart disease and high blood pressure. Discus how high blood pressure affects the body and ways to reduce high blood pressure.   CARDIAC REHAB PHASE II EXERCISE from 11/15/2017 in [place]  Date  09/27/17  Educator  Russella Dar  Instruction Review Code  2- Demonstrated Understanding      Exercise and Your Heart -Discuss why it is important to exercise, the FITT principles of exercise, normal and abnormal responses to exercise, and how to exercise safely.   CARDIAC REHAB PHASE II EXERCISE from 11/15/2017 in [place]  Date  10/04/17  Educator  [place]  Instruction Review Code  2- Demonstrated Understanding      Angina -Discuss definition of angina, causes of angina, treatment of angina, and how to decrease risk of having angina.   CARDIAC REHAB PHASE II EXERCISE from 11/15/2017 in [place]  Date  10/11/17  Educator  DC  Instruction Review Code  2- Demonstrated Understanding      Cardiac Medications -Review what the following cardiac medications are used for,  how they affect the body, and side effects that may occur when taking the medications.  Medications include Aspirin, Beta blockers, calcium channel blockers, ACE Inhibitors, angiotensin receptor blockers, diuretics, digoxin, and antihyperlipidemics.   CARDIAC REHAB PHASE II EXERCISE from 11/15/2017 in [place]  Date  10/18/17  Educator  DC  Instruction Review Code  2- Demonstrated Understanding      Congestive Heart Failure -Discuss the definition of CHF, how to live with CHF, the signs and symptoms of CHF, and  how keep track of weight and sodium intake.   CARDIAC REHAB PHASE II EXERCISE from 11/15/2017 in [place]  Date  10/25/17  Educator  DC  Instruction Review Code  2- Demonstrated Understanding      Heart Disease and Intimacy -Discus the effect sexual activity has on the heart, how changes occur during intimacy as we age, and safety during sexual activity.   CARDIAC REHAB PHASE II EXERCISE from 11/15/2017 in [place]  Date  08/30/17  Educator  DJ  Instruction Review Code  2- Demonstrated Understanding      Smoking Cessation / COPD -Discuss different methods to quit smoking, the health benefits of quitting smoking, and the definition of COPD.   CARDIAC REHAB PHASE II EXERCISE from 11/15/2017 in [place]  Date  11/08/17  Educator  DJ  Instruction Review Code  2- Demonstrated Understanding      Nutrition I: Fats -Discuss the types of cholesterol, what cholesterol does to the heart, and how cholesterol levels can be controlled.   Nutrition II: Labels -Discuss the different components of food labels and how to read food label   CARDIAC REHAB PHASE II EXERCISE from 11/15/2017 in [place]  Date  11/15/17  Educator  DC  Instruction Review Code  2- Demonstrated Understanding      Heart Parts/Heart Disease and PAD -Discuss the anatomy of the heart, the  pathway of blood circulation through the heart, and these are affected by heart disease.   Stress I: Signs and Symptoms -Discuss the causes of stress, how stress may lead to anxiety and depression, and ways to limit stress.   CARDIAC REHAB PHASE II EXERCISE from 11/15/2017 in [place]  Date  09/06/17  Educator  [place]  Instruction Review Code  2- Demonstrated Understanding      Stress II: Relaxation -Discuss different types of relaxation techniques to limit stress.   CARDIAC REHAB PHASE II EXERCISE from 11/15/2017 in [place]  Date  09/13/17  Educator  DJ  Instruction Review Code  2- Demonstrated Understanding      Warning Signs of Stroke / TIA -Discuss definition of a stroke, what the signs and symptoms are of a stroke, and how to identify when someone is having stroke.   CARDIAC REHAB PHASE II EXERCISE from 11/15/2017 in [place]  Date  09/20/17  Educator  DC  Instruction Review Code  2- Demonstrated Understanding      Knowledge Questionnaire Score: Knowledge Questionnaire Score - 08/24/17 1014      Knowledge Questionnaire Score   Pre Score  20/24       Core Components/Risk Factors/Patient Goals at Admission: Personal Goals and Risk Factors at Admission - 08/24/17 1018      Core Components/Risk Factors/Patient Goals on Admission    Weight Management  Weight Maintenance    Personal Goal Other  Yes    Personal Goal  Become more heart healthy and breathe better.     Intervention  Attend CR 3 x week and supplement with home exercise 2 x week.    Expected Outcomes  Reach personal goals       Core Components/Risk Factors/Patient Goals Review:  Goals and Risk Factor Review    Row Name 09/20/17 1301 10/09/17 1404 11/02/17 1013         Core Components/Risk Factors/Patient Goals Review   Personal Goals Review  Weight Management/Obesity Breathe better; be heart healthy.  Weight Management/Obesity  Breathe better; be heart healthy.  Weight Management/Obesity;Improve shortness of breath with ADL's Breathe better; be heart healthy.      Review  Patient has completed 12 sessions gaining 2.5 lbs since he started the program. He is doing well in the program with some progression. He says he feels the program has helped him so far with increased energy. He has not seen an improvement in his breathing. He hopes to see more improvement at he continues. Will continue to monitor for progress.   Patient has completed 20 sessions losing 1 lbs since his initial visit. He is doing well in the program with progression. He continues to say he feels stronger and he has more energy. He says he feels less SOB with activity. His balance has also improved along with his overall gait. He is now independent with the equipment. Will continue to monitor for progress.   Patient has completed 29 session maintaining his weight since his initial visit. He continues to do well in the program with progression. He does admitt to eating more pork lately and we have encouraged him to cut back becuase it is effecting his blood pressure. We also spoke to his son about this and he said he would monitor this more closley. Patient says he continues to feel stronger and his balance and walking have improved. He says he is noticing less SOB now with activity. He feels the program is helping him. Will continue to monitor for progress.      Expected Outcomes  Patient will continue to attend sessions and complete the program meeting his personal goals.   Patient will continue to attend sessions and complete the program meeting his personal goals.   Patient will continue to attend sessions and complete the program meeting his personal goals.         Core Components/Risk Factors/Patient Goals at Discharge (Final Review):  Goals and Risk Factor Review - 11/02/17 1013      Core Components/Risk Factors/Patient Goals Review   Personal Goals Review   Weight Management/Obesity;Improve shortness of breath with ADL's Breathe better; be heart healthy.     Review  Patient has completed 29 session maintaining his weight since his initial visit. He continues to do well in the program with progression. He does admitt to eating more pork lately and we have encouraged him to cut back becuase it is effecting his blood pressure. We also spoke to his son about this and he said he would monitor this more closley. Patient says he continues to feel stronger and his balance and walking have improved. He says he is noticing less SOB now with activity. He feels the program is helping him. Will continue to monitor for progress.     Expected Outcomes  Patient will continue to attend sessions and complete the program meeting his personal goals.        ITP Comments: ITP Comments    Row Name 08/24/17 1016 08/24/17 1046 08/24/17 1247 09/21/17 1620 11/29/17 1343   ITP Comments  Patient is been referred to Cardiac Rehab by Dr. Irish Lack due to NSTEMI and Stent placement.   Patient is beening referred to Cardiac Rehab by Dr. Irish Lack due to NSTEMI and Stent placement.   Patient new to program. Plans to start Monday 08/28/17. Will continue to monitor for progress.   Patient attend family matters class with hospital chaplain to discuss how this event has impacted their life and family.  Patient graduated 11/17/17. We are waiting  on his discharge documentation to complete his discharge.       Comments: ITP 30 Day REVIEW Patient graduated 11/17/17. We are waiting on his discharge documentation to complete his discharge.

## 2017-11-30 ENCOUNTER — Telehealth: Payer: Self-pay | Admitting: Cardiology

## 2017-11-30 NOTE — Telephone Encounter (Signed)
Patient walked in with lab orders and labs from 08/2017  He wanted to know if results would be ok for order Dr [name] needed or if he still needs to go have labwork done

## 2017-11-30 NOTE — Telephone Encounter (Signed)
Lab orders scanned into chart. Will forward to Dr. [name]

## 2017-12-01 NOTE — Telephone Encounter (Signed)
Patient notified and verbalized understanding. 

## 2017-12-01 NOTE — Telephone Encounter (Signed)
Those lab results are fine, we don't need to repeat them.    Zandra Abts MD

## 2017-12-13 NOTE — Addendum Note (Signed)
Encounter addended by: Dwana Melena, RN on: 12/13/2017 2:31 PM  Actions taken: Flowsheet data copied forward, Visit Navigator Flowsheet section accepted

## 2017-12-13 NOTE — Progress Notes (Signed)
Discharge Progress Report  Patient Details  Name: Paul Snyder MRN: [id number] Date of Birth: [birth year] Referring Provider:     CARDIAC REHAB PHASE II ORIENTATION from 08/24/2017 in [place]  Referring Provider  Dr. [name]       Number of Visits: 36  Reason for Discharge:  Patient reached a stable level of exercise. Patient independent in their exercise. Patient has met program and personal goals.  Smoking History:  Social History   Tobacco Use  Smoking Status Former Smoker  . Packs/day: 1.00  . Years: 42.00  . Pack years: 42.00  . Types: Cigarettes  . Last attempt to quit: 1996  . Years since quitting: 23.5  Smokeless Tobacco Never Used    Diagnosis:  NSTEMI (non-ST elevated myocardial infarction) ([place])  Status post coronary artery stent placement  ADL [facility]SD:   Initial Exercise Prescription: Initial Exercise Prescription - 08/24/17 1000      Date of Initial Exercise RX and Referring Provider   Date  08/24/17    Referring Provider  Dr. [name]      Treadmill   MPH  0.8    Grade  0    Minutes  15    METs  1.6      NuStep   Level  1    SPM  56    Minutes  20    METs  1.6      Prescription Details   Frequency (times per week)  3    Duration  Progress to 30 minutes of continuous aerobic without signs/symptoms of physical distress      Intensity   THRR 40-80% of Max Heartrate  [phone]    Ratings of Perceived Exertion  11-13    Perceived Dyspnea  0-4      Progression   Progression  Continue progressive overload as per policy without signs/symptoms or physical distress.      Resistance Training   Training Prescription  Yes    Weight  1    Reps  10-15       Discharge Exercise Prescription (Final Exercise Prescription Changes): Exercise Prescription Changes - 11/06/17 1400      Response to Exercise   Blood Pressure (Admit)  140/60    Blood Pressure (Exercise)  126/60    Blood Pressure (Exit)  136/70    Heart Rate  (Admit)  61 bpm    Heart Rate (Exercise)  98 bpm    Heart Rate (Exit)  82 bpm    Rating of Perceived Exertion (Exercise)  11    Duration  Progress to 30 minutes of  aerobic without signs/symptoms of physical distress    Intensity  THRR New [phone]      Progression   Progression  Continue to progress workloads to maintain intensity without signs/symptoms of physical distress.      Resistance Training   Training Prescription  Yes    Weight  3    Reps  10-15      Treadmill   MPH  1.5    Grade  0    Minutes  15    METs  2.1      NuStep   Level  2    SPM  106    Minutes  20    METs  1.8      Home Exercise Plan   Plans to continue exercise at  Home (comment)    Frequency  Add 2 additional days to program exercise sessions.  Initial Home Exercises Provided  09/25/17       Functional Capacity: 6 Minute Walk    Row Name 08/24/17 1002 11/17/17 1427       6 Minute Walk   Phase  Initial  Discharge    Distance  850 feet  850 feet    Distance % Change  0 %  0 %    Distance Feet Change  0 ft  0 ft    Walk Time  6 minutes  6 minutes    # of Rest Breaks  0  0    MPH  1.6  1.6    METS  2.23  2.23    RPE  12  11    Perceived Dyspnea   11  12    VO2 Peak  6.16  6.56    Symptoms  No  No    Resting HR  51 bpm  68 bpm    Resting BP  100/50  110/58    Resting Oxygen Saturation   95 %  95 %    Exercise Oxygen Saturation  during 6 min walk  93 %  87 %    Max Ex. HR  89 bpm  105 bpm    Max Ex. BP  128/58  126/70    2 Minute Post BP  106/52  108/60       Psychological, QOL, Others - Outcomes: PHQ 2/9: Depression screen [facility] 2/9 12/13/2017 08/24/2017  Decreased Interest 0 0  Down, Depressed, Hopeless 0 0  PHQ - 2 Score 0 0  Altered sleeping 0 0  Tired, decreased energy 0 0  Change in appetite 0 0  Feeling bad or failure about yourself  0 0  Trouble concentrating 0 0  Moving slowly or fidgety/restless 0 0  Suicidal thoughts 0 0  PHQ-9 Score 0 0  Difficult doing  work/chores - Not difficult at all    Quality of Life: Quality of Life - 12/13/17 1437      Quality of Life   Select  Quality of Life      Quality of Life Scores   Health/Function Pre  22.83 %    Health/Function Post  25.2 %    Health/Function % Change  10.38 %    Socioeconomic Pre  23.88 %    Socioeconomic Post  25.71 %    Socioeconomic % Change   7.66 %    Psych/Spiritual Pre  29.14 %    Psych/Spiritual Post  30 %    Psych/Spiritual % Change  2.95 %    Family Pre  28.8 %    Family Post  30 %    Family % Change  4.17 %    GLOBAL Pre  25.19 %    GLOBAL Post  26.91 %    GLOBAL % Change  6.83 %       Personal Goals: Goals established at orientation with interventions provided to work toward goal. Personal Goals and Risk Factors at Admission - 08/24/17 1018      Core Components/Risk Factors/Patient Goals on Admission    Weight Management  Weight Maintenance    Personal Goal Other  Yes    Personal Goal  Become more heart healthy and breathe better.     Intervention  Attend CR 3 x week and supplement with home exercise 2 x week.    Expected Outcomes  Reach personal goals        Personal Goals Discharge: Goals and Risk Factor  Review    Row Name 09/20/17 1301 10/09/17 1404 11/02/17 1013 12/13/17 1444       Core Components/Risk Factors/Patient Goals Review   Personal Goals Review  Weight Management/Obesity Breathe better; be heart healthy.   Weight Management/Obesity Breathe better; be heart healthy.  Weight Management/Obesity;Improve shortness of breath with ADL's Breathe better; be heart healthy.   Weight Management/Obesity;Improve shortness of breath with ADL's Breathe better; be heart healhty.    Review  Patient has completed 12 sessions gaining 2.5 lbs since he started the program. He is doing well in the program with some progression. He says he feels the program has helped him so far with increased energy. He has not seen an improvement in his breathing. He hopes to see  more improvement at he continues. Will continue to monitor for progress.   Patient has completed 20 sessions losing 1 lbs since his initial visit. He is doing well in the program with progression. He continues to say he feels stronger and he has more energy. He says he feels less SOB with activity. His balance has also improved along with his overall gait. He is now independent with the equipment. Will continue to monitor for progress.   Patient has completed 29 session maintaining his weight since his initial visit. He continues to do well in the program with progression. He does admitt to eating more pork lately and we have encouraged him to cut back becuase it is effecting his blood pressure. We also spoke to his son about this and he said he would monitor this more closley. Patient says he continues to feel stronger and his balance and walking have improved. He says he is noticing less SOB now with activity. He feels the program is helping him. Will continue to monitor for progress.   Patient graduated with 36 sessions maintaining his weight since his initial visit. He did well in the program stating he feels better overall and feels stronger with improved balance and breathing. His exit measurements improved in grip strength. His waist circumference decreased by 1 inch. He exit walk test remained the same. His diet did not improve. His medficts score increased by 15. He says he is going to try and cut back on his pork and red meat. He says he is exercising 6 days/week and plans to continue exercising at home by walking.     Expected Outcomes  Patient will continue to attend sessions and complete the program meeting his personal goals.   Patient will continue to attend sessions and complete the program meeting his personal goals.   Patient will continue to attend sessions and complete the program meeting his personal goals.   Patient will continue exercising at home by walking and continue to meet his personal  goals. CR will f/u for one year.        Exercise Goals and Review: Exercise Goals    Row Name 08/24/17 1004             Exercise Goals   Increase Physical Activity  Yes       Intervention  Develop an individualized exercise prescription for aerobic and resistive training based on initial evaluation findings, risk stratification, comorbidities and participant's personal goals.;Provide advice, education, support and counseling about physical activity/exercise needs.       Expected Outcomes  Short Term: Attend rehab on a regular basis to increase amount of physical activity.;Long Term: Exercising regularly at least 3-5 days a week.;Long Term: Add in home exercise to  make exercise part of routine and to increase amount of physical activity.       Increase Strength and Stamina  Yes       Intervention  Develop an individualized exercise prescription for aerobic and resistive training based on initial evaluation findings, risk stratification, comorbidities and participant's personal goals.;Provide advice, education, support and counseling about physical activity/exercise needs.       Expected Outcomes  Short Term: Increase workloads from initial exercise prescription for resistance, speed, and METs.       Able to understand and use rate of perceived exertion (RPE) scale  Yes       Intervention  Provide education and explanation on how to use RPE scale       Expected Outcomes  Short Term: Able to use RPE daily in rehab to express subjective intensity level;Long Term:  Able to use RPE to guide intensity level when exercising independently       Able to understand and use Dyspnea scale  Yes       Intervention  Provide education and explanation on how to use Dyspnea scale       Expected Outcomes  Short Term: Able to use Dyspnea scale daily in rehab to express subjective sense of shortness of breath during exertion;Long Term: Able to use Dyspnea scale to guide intensity level when exercising  independently       Knowledge and understanding of Target Heart Rate Range (THRR)  Yes       Intervention  Provide education and explanation of THRR including how the numbers were predicted and where they are located for reference       Expected Outcomes  Short Term: Able to state/look up THRR;Long Term: Able to use THRR to govern intensity when exercising independently;Short Term: Able to use daily as guideline for intensity in rehab       Able to check pulse independently  Yes       Intervention  Provide education and demonstration on how to check pulse in carotid and radial arteries.;Review the importance of being able to check your own pulse for safety during independent exercise       Expected Outcomes  Short Term: Able to explain why pulse checking is important during independent exercise;Long Term: Able to check pulse independently and accurately       Understanding of Exercise Prescription  Yes       Intervention  Provide education, explanation, and written materials on patient's individual exercise prescription       Expected Outcomes  Short Term: Able to explain program exercise prescription;Long Term: Able to explain home exercise prescription to exercise independently          Nutrition & Weight - Outcomes: Pre Biometrics - 08/24/17 1004      Pre Biometrics   Height  6' (1.829 m)    Weight  178 lb 3.2 oz (80.8 kg)    Waist Circumference  41.5 inches    Hip Circumference  39 inches    Waist to Hip Ratio  1.06 %    BMI (Calculated)  24.16    Triceps Skinfold  8 mm    % Body Fat  24.6 %    Grip Strength  61.26 kg    Flexibility  0 in    Single Leg Stand  1 seconds      Post Biometrics - 12/13/17 1441       Post  Biometrics   Height  6' (1.829 m)  Weight  178 lb 11.2 oz (81.1 kg)    Waist Circumference  40 inches    Hip Circumference  39 inches    Waist to Hip Ratio  1.03 %    BMI (Calculated)  24.23    Triceps Skinfold  8 mm    % Body Fat  23.9 %    Grip Strength   87.6 kg    Flexibility  0 in    Single Leg Stand  1 seconds       Nutrition: Nutrition Therapy & Goals - 09/21/17 1619      Personal Nutrition Goals   Nutrition Goal  For heart healthy choices add >50% of whole grains, make half their plate fruits and vegetables. Discuss the difference between starchy vegetables and leafy greens, and how leafy vegetables provide fiber, helps maintain healthy weight, helps control blood glucose, and lowers cholesterol.  Discuss purchasing fresh or frozen vegetable to reduce sodium and not to add grease, fat or sugar. Consume <18oz of red meat per week. Consume lean cuts of meats and very little of meats high in sodium and nitrates such as pork and lunch meats. Discussed portion control for all food groups.     Additional Goals?  No      Intervention Plan   Intervention  Nutrition handout(s) given to patient.    Expected Outcomes  Short Term Goal: Understand basic principles of dietary content, such as calories, fat, sodium, cholesterol and nutrients.       Nutrition Discharge: Nutrition Assessments - 12/13/17 1416      MEDFICTS Scores   Pre Score  40    Post Score  55    Score Difference  15       Education Questionnaire Score: Knowledge Questionnaire Score - 12/13/17 1416      Knowledge Questionnaire Score   Pre Score  20/24    Post Score  22/24       Goals reviewed with patient; copy given to patient.

## 2017-12-13 NOTE — Progress Notes (Signed)
Cardiac Individual Treatment Plan  Patient Details  Name: Paul Snyder MRN: [id number] Date of Birth: 12-23-1935 Referring Provider:     CARDIAC REHAB PHASE II ORIENTATION from 08/24/2017 in [place]  Referring Provider  Dr. [name]      Initial Encounter Date:    CARDIAC REHAB PHASE II ORIENTATION from 08/24/2017 in [place]  Date  08/24/17      Visit Diagnosis: NSTEMI (non-ST elevated myocardial infarction) [facility])  Status post coronary artery stent placement  Patient's Home Medications on Admission:  Current Outpatient Medications:  .  albuterol (PROVENTIL HFA;VENTOLIN HFA) 108 (90 Base) MCG/ACT inhaler, Inhale 2 puffs into the lungs 4 (four) times daily. , Disp: , Rfl:  .  aspirin EC 81 MG EC tablet, Take 1 tablet (81 mg total) by mouth daily., Disp: , Rfl:  .  atorvastatin (LIPITOR) 80 MG tablet, Take 1 tablet (80 mg total) by mouth daily at 6 PM., Disp: 90 tablet, Rfl: 3 .  clopidogrel (PLAVIX) 75 MG tablet, Take 75 mg by mouth daily., Disp: , Rfl:  .  furosemide (LASIX) 20 MG tablet, Take 20 mg by mouth daily., Disp: , Rfl:  .  lisinopril (PRINIVIL,ZESTRIL) 10 MG tablet, Take 1 tablet (10 mg total) by mouth daily., Disp: 30 tablet, Rfl: 6 .  meclizine (ANTIVERT) 12.5 MG tablet, Take 12.5 mg by mouth daily., Disp: , Rfl: 1 .  niacin 500 MG tablet, Take 500 mg by mouth daily. , Disp: , Rfl:  .  nitroGLYCERIN (NITROSTAT) 0.4 MG SL tablet, Place 1 tablet (0.4 mg total) under the tongue every 5 (five) minutes x 3 doses as needed for chest pain., Disp: 25 tablet, Rfl: 2 .  pantoprazole (PROTONIX) 40 MG tablet, Take 40 mg by mouth daily., Disp: , Rfl:   Past Medical History: Past Medical History:  Diagnosis Date  . CHF (congestive heart failure) ([place])   . COPD (chronic obstructive pulmonary disease) ([place])   . GERD (gastroesophageal reflux disease)   . Hyperlipidemia   . Lung cancer ([place]) 2009  . NSTEMI (non-ST elevated myocardial  infarction) ([place]) 07/16/2017   2/19 PCI/DES x1 to Lcx with moderate (50%) in the RCA, mild (20%) in LAD    Tobacco Use: Social History   Tobacco Use  Smoking Status Former Smoker  . Packs/day: 1.00  . Years: 42.00  . Pack years: 42.00  . Types: Cigarettes  . Last attempt to quit: 1996  . Years since quitting: 23.5  Smokeless Tobacco Never Used    Labs: Recent Review Scientist, physiological    Labs for ITP Cardiac and Pulmonary Rehab Latest Ref Rng & Units 07/20/2017   Cholestrol 0 - 200 mg/dL 126   LDLCALC 0 - 99 mg/dL 71   HDL >40 mg/dL 32(L)   Trlycerides <150 mg/dL 117   Hemoglobin A1c 4.8 - 5.6 % 5.8(H)      Capillary Blood Glucose: Lab Results  Component Value Date   GLUCAP 94 01/21/2009     Exercise Target Goals:    Exercise Program Goal: Individual exercise prescription set using results from initial 6 min walk test and THRR while considering  patient's activity barriers and safety.   Exercise Prescription Goal: Starting with aerobic activity 30 plus minutes a day, 3 days per week for initial exercise prescription. Provide home exercise prescription and guidelines that participant acknowledges understanding prior to discharge.  Activity Barriers & Risk Stratification: Activity Barriers & Cardiac Risk Stratification - 08/24/17 1002  Activity Barriers & Cardiac Risk Stratification   Activity Barriers  Shortness of Breath;Deconditioning    Cardiac Risk Stratification  High       6 Minute Walk: 6 Minute Walk    Row Name 08/24/17 1002 11/17/17 1427       6 Minute Walk   Phase  Initial  Discharge    Distance  850 feet  850 feet    Distance % Change  0 %  0 %    Distance Feet Change  0 ft  0 ft    Walk Time  6 minutes  6 minutes    # of Rest Breaks  0  0    MPH  1.6  1.6    METS  2.23  2.23    RPE  12  11    Perceived Dyspnea   11  12    VO2 Peak  6.16  6.56    Symptoms  No  No    Resting HR  51 bpm  68 bpm    Resting BP  100/50  110/58    Resting  Oxygen Saturation   95 %  95 %    Exercise Oxygen Saturation  during 6 min walk  93 %  87 %    Max Ex. HR  89 bpm  105 bpm    Max Ex. BP  128/58  126/70    2 Minute Post BP  106/52  108/60       Oxygen Initial Assessment:   Oxygen Re-Evaluation:   Oxygen Discharge (Final Oxygen Re-Evaluation):   Initial Exercise Prescription: Initial Exercise Prescription - 08/24/17 1000      Date of Initial Exercise RX and Referring Provider   Date  08/24/17    Referring Provider  Dr. [name]      Treadmill   MPH  0.8    Grade  0    Minutes  15    METs  1.6      NuStep   Level  1    SPM  56    Minutes  20    METs  1.6      Prescription Details   Frequency (times per week)  3    Duration  Progress to 30 minutes of continuous aerobic without signs/symptoms of physical distress      Intensity   THRR 40-80% of Max Heartrate  [phone]    Ratings of Perceived Exertion  11-13    Perceived Dyspnea  0-4      Progression   Progression  Continue progressive overload as per policy without signs/symptoms or physical distress.      Resistance Training   Training Prescription  Yes    Weight  1    Reps  10-15       Perform Capillary Blood Glucose checks as needed.  Exercise Prescription Changes:  Exercise Prescription Changes    Row Name 09/11/17 1500 09/26/17 0700 10/06/17 1400 10/20/17 1400 11/06/17 1400     Response to Exercise   Blood Pressure (Admit)  114/62  120/50  124/72  128/70  140/60   Blood Pressure (Exercise)  100/54  104/50  128/72  140/60  126/60   Blood Pressure (Exit)  114/50  110/50  110/50  110/56  136/70   Heart Rate (Admit)  77 bpm  55 bpm  46 bpm  46 bpm  61 bpm   Heart Rate (Exercise)  85 bpm  65 bpm  67 bpm  83 bpm  98 bpm   Heart Rate (Exit)  66 bpm  59 bpm  55 bpm  55 bpm  82 bpm   Rating of Perceived Exertion (Exercise)  10  10  10  11  11    Duration  Progress to 30 minutes of  aerobic without signs/symptoms of physical distress  Progress to 30  minutes of  aerobic without signs/symptoms of physical distress  Progress to 30 minutes of  aerobic without signs/symptoms of physical distress  Progress to 30 minutes of  aerobic without signs/symptoms of physical distress  Progress to 30 minutes of  aerobic without signs/symptoms of physical distress   Intensity  THRR New 102-115-127  THRR New 89-106-123  THRR New 84-102-121  THRR New 84-103-121  THRR New 93-108-124     Progression   Progression  Continue to progress workloads to maintain intensity without signs/symptoms of physical distress.  Continue to progress workloads to maintain intensity without signs/symptoms of physical distress.  Continue to progress workloads to maintain intensity without signs/symptoms of physical distress.  Continue to progress workloads to maintain intensity without signs/symptoms of physical distress.  Continue to progress workloads to maintain intensity without signs/symptoms of physical distress.     Resistance Training   Training Prescription  Yes  Yes  Yes  Yes  Yes   Weight  1  2  2  2  3    Reps  10-15  10-15  10-15  10-15  10-15     Treadmill   MPH  1  1.1  1.2  1.3  1.5   Grade  0  0  0  0  0   Minutes  15  15  15  15  15    METs  1.7  1.8  1.9  1.9  2.1     NuStep   Level  1  2  2  2  2    SPM  97  89  98  102  106   Minutes  20  20  20  20  20    METs  1.9  1.8  1.8  1.8  1.8     Home Exercise Plan   Plans to continue exercise at  -  Home (comment)  Home (comment)  Home (comment)  Home (comment)   Frequency  -  Add 2 additional days to program exercise sessions.  Add 2 additional days to program exercise sessions.  Add 2 additional days to program exercise sessions.  Add 2 additional days to program exercise sessions.   Initial Home Exercises Provided  -  09/25/17  09/25/17  09/25/17  09/25/17      Exercise Comments:  Exercise Comments    Row Name 09/11/17 1507 09/26/17 0744 10/06/17 1459 10/20/17 1429 11/06/17 1449   Exercise Comments   Patient is doing very well in CR and has been very productive on the treadmill as well as the nustep machines. Patient has increased his speed on the treadmill to 1.0 and has increased his watts on the nustep to 97. Patient is still only on his eigth session and has been doing wel. He will be monitored throughout the remainder of the program.   Patient is doing very well in CR. Patient has increased his level on the nustep machine and as well his speed on the treadmill. Patient has had no complaints since starting the program and seems to be enjoying the company of others. Patient does not complain of SOB while on his machines which was  one of his goals from the beginning of the program.   Patient continues to do well in CR. Patient has increased his speed on the treadmill to 1.2. Patient has also increased his SPMs on the nustep to 98. Patient has increased his distance to about a mile each time on the nustep as well. Patient stated that he feels stronger since beginning the program and it is evident through his progressions that it is true.   Patient is doing well in CR. patient has increased his speed on the treadmill to 1.3 and has also maintained his level on the nustep machine and the SPMs. Patient has stated to me that he feels more energy and strength from the program.   Patient is doing very well in CR. Patient has increased his speed on teh treadmill to 1.5 and has also increased the size of his dumbbells to 3lbs. Patient has truly enjoyed coming here to CR.       Exercise Goals and Review:  Exercise Goals    Row Name 08/24/17 1004             Exercise Goals   Increase Physical Activity  Yes       Intervention  Develop an individualized exercise prescription for aerobic and resistive training based on initial evaluation findings, risk stratification, comorbidities and participant's personal goals.;Provide advice, education, support and counseling about physical activity/exercise needs.        Expected Outcomes  Short Term: Attend rehab on a regular basis to increase amount of physical activity.;Long Term: Exercising regularly at least 3-5 days a week.;Long Term: Add in home exercise to make exercise part of routine and to increase amount of physical activity.       Increase Strength and Stamina  Yes       Intervention  Develop an individualized exercise prescription for aerobic and resistive training based on initial evaluation findings, risk stratification, comorbidities and participant's personal goals.;Provide advice, education, support and counseling about physical activity/exercise needs.       Expected Outcomes  Short Term: Increase workloads from initial exercise prescription for resistance, speed, and METs.       Able to understand and use rate of perceived exertion (RPE) scale  Yes       Intervention  Provide education and explanation on how to use RPE scale       Expected Outcomes  Short Term: Able to use RPE daily in rehab to express subjective intensity level;Long Term:  Able to use RPE to guide intensity level when exercising independently       Able to understand and use Dyspnea scale  Yes       Intervention  Provide education and explanation on how to use Dyspnea scale       Expected Outcomes  Short Term: Able to use Dyspnea scale daily in rehab to express subjective sense of shortness of breath during exertion;Long Term: Able to use Dyspnea scale to guide intensity level when exercising independently       Knowledge and understanding of Target Heart Rate Range (THRR)  Yes       Intervention  Provide education and explanation of THRR including how the numbers were predicted and where they are located for reference       Expected Outcomes  Short Term: Able to state/look up THRR;Long Term: Able to use THRR to govern intensity when exercising independently;Short Term: Able to use daily as guideline for intensity in rehab  Able to check pulse independently  Yes        Intervention  Provide education and demonstration on how to check pulse in carotid and radial arteries.;Review the importance of being able to check your own pulse for safety during independent exercise       Expected Outcomes  Short Term: Able to explain why pulse checking is important during independent exercise;Long Term: Able to check pulse independently and accurately       Understanding of Exercise Prescription  Yes       Intervention  Provide education, explanation, and written materials on patient's individual exercise prescription       Expected Outcomes  Short Term: Able to explain program exercise prescription;Long Term: Able to explain home exercise prescription to exercise independently          Exercise Goals Re-Evaluation : Exercise Goals Re-Evaluation    Row Name 09/11/17 1505 10/06/17 1457 10/30/17 1511         Exercise Goal Re-Evaluation   Exercise Goals Review  Increase Physical Activity;Increase Strength and Stamina;Able to understand and use Dyspnea scale;Able to check pulse independently;Able to understand and use rate of perceived exertion (RPE) scale;Knowledge and understanding of Target Heart Rate Range (THRR);Understanding of Exercise Prescription  Increase Physical Activity;Increase Strength and Stamina;Able to understand and use Dyspnea scale;Able to check pulse independently;Able to understand and use rate of perceived exertion (RPE) scale;Knowledge and understanding of Target Heart Rate Range (THRR);Understanding of Exercise Prescription  Increase Physical Activity;Increase Strength and Stamina;Able to understand and use Dyspnea scale;Able to check pulse independently;Able to understand and use rate of perceived exertion (RPE) scale;Knowledge and understanding of Target Heart Rate Range (THRR);Understanding of Exercise Prescription     Comments  Patient is doing very well in CR and has been very productive on the treadmill as well as the nustep machines. Patient has  increased his speed on the treadmill to 1.0 and has increased his watts on the nustep to 97. Patient is still only on his eigth session and has been doing wel. He will be monitored throughout the remainder of the program.   Patient continues to do well in CR. Patient has increased his speed on the treadmill to 1.2. Patient has also increased his SPMs on the nustep to 98. Patient has increased his distance to about a mile each time on the nustep as well. Patient stated that he feels stronger since beginning the program and it is evident through his progressions that it is true.   Patient is doing well in CR. Patient states that his is helping him breathe better and has much more endurance and strength. Patient looks better overall.      Expected Outcomes  Patient wishes to become heart healthy and be able to breathe better.   Patient wishes to become heart healthy and be able to breathe better.   Patient wishes to become heart healthy and be able to breathe better.          Discharge Exercise Prescription (Final Exercise Prescription Changes): Exercise Prescription Changes - 11/06/17 1400      Response to Exercise   Blood Pressure (Admit)  140/60    Blood Pressure (Exercise)  126/60    Blood Pressure (Exit)  136/70    Heart Rate (Admit)  61 bpm    Heart Rate (Exercise)  98 bpm    Heart Rate (Exit)  82 bpm    Rating of Perceived Exertion (Exercise)  11    Duration  Progress  to 30 minutes of  aerobic without signs/symptoms of physical distress    Intensity  THRR New [id number]      Progression   Progression  Continue to progress workloads to maintain intensity without signs/symptoms of physical distress.      Resistance Training   Training Prescription  Yes    Weight  3    Reps  10-15      Treadmill   MPH  1.5    Grade  0    Minutes  15    METs  2.1      NuStep   Level  2    SPM  106    Minutes  20    METs  1.8      Home Exercise Plan   Plans to continue exercise at  Home  (comment)    Frequency  Add 2 additional days to program exercise sessions.    Initial Home Exercises Provided  09/25/17       Nutrition:  Target Goals: Understanding of nutrition guidelines, daily intake of sodium <1562m, cholesterol <2048m calories 30% from fat and 7% or less from saturated fats, daily to have 5 or more servings of fruits and vegetables.  Biometrics: Pre Biometrics - 08/24/17 1004      Pre Biometrics   Height  6' (1.829 m)    Weight  178 lb 3.2 oz (80.8 kg)    Waist Circumference  41.5 inches    Hip Circumference  39 inches    Waist to Hip Ratio  1.06 %    BMI (Calculated)  24.16    Triceps Skinfold  8 mm    % Body Fat  24.6 %    Grip Strength  61.26 kg    Flexibility  0 in    Single Leg Stand  1 seconds      Post Biometrics - 12/13/17 1441       Post  Biometrics   Height  6' (1.829 m)    Weight  178 lb 11.2 oz (81.1 kg)    Waist Circumference  40 inches    Hip Circumference  39 inches    Waist to Hip Ratio  1.03 %    BMI (Calculated)  24.23    Triceps Skinfold  8 mm    % Body Fat  23.9 %    Grip Strength  87.6 kg    Flexibility  0 in    Single Leg Stand  1 seconds       Nutrition Therapy Plan and Nutrition Goals: Nutrition Therapy & Goals - 09/21/17 1619      Personal Nutrition Goals   Nutrition Goal  For heart healthy choices add >50% of whole grains, make half their plate fruits and vegetables. Discuss the difference between starchy vegetables and leafy greens, and how leafy vegetables provide fiber, helps maintain healthy weight, helps control blood glucose, and lowers cholesterol.  Discuss purchasing fresh or frozen vegetable to reduce sodium and not to add grease, fat or sugar. Consume <18oz of red meat per week. Consume lean cuts of meats and very little of meats high in sodium and nitrates such as pork and lunch meats. Discussed portion control for all food groups.     Additional Goals?  No      Intervention Plan   Intervention   Nutrition handout(s) given to patient.    Expected Outcomes  Short Term Goal: Understand basic principles of dietary content, such as calories, fat, sodium, cholesterol and nutrients.  Nutrition Assessments: Nutrition Assessments - 12/13/17 1416      MEDFICTS Scores   Pre Score  40    Post Score  55    Score Difference  15       Nutrition Goals Re-Evaluation: Nutrition Goals Re-Evaluation    [place] Name 10/09/17 1402 11/02/17 1011           Goals   Current Weight  177 lb 11.2 oz (80.6 kg)  178 lb 8 oz (81 kg)      Nutrition Goal  For heart healthy choices add >50% of whole grains, make half their plate fruits and vegetables. Discuss the difference between starchy vegetables and leafy greens, and how leafy vegetables provide fiber, helps maintain healthy weight, helps control blood glucose, and lowers cholesterol.  Discuss purchasing fresh or frozen vegetable to reduce sodium and not to add grease, fat or sugar. Consume <18oz of red meat per week. Consume lean cuts of meats and very little of meats high in sodium and nitrates such as pork and lunch meats. Discussed portion control for all food groups.   For heart healthy choices add >50% of whole grains, make half their plate fruits and vegetables. Discuss the difference between starchy vegetables and leafy greens, and how leafy vegetables provide fiber, helps maintain healthy weight, helps control blood glucose, and lowers cholesterol.  Discuss purchasing fresh or frozen vegetable to reduce sodium and not to add grease, fat or sugar. Consume <18oz of red meat per week. Consume lean cuts of meats and very little of meats high in sodium and nitrates such as pork and lunch meats. Discussed portion control for all food groups.       Comment  Patient says he is eating a heart healthy diet.   Patient has gained one lb since his last 30 day review. He admitts to eating more pork lately which is causing elevated blood pressure. We told his son and  he says he would monitor his diet more closely. Will continue to monitor.       Expected Outcome  Patient will continue to eat a heart healthy diet.   Patient will continue to eat a heart healthy diet.         Personal Goal #2 Re-Evaluation   Personal Goal #2  Patient's family (son and daughter) are helping him with a heart healthy diet.   Patient's family (son and daughter) are helping him with a heart healthy diet.          Nutrition Goals Discharge (Final Nutrition Goals Re-Evaluation): Nutrition Goals Re-Evaluation - 11/02/17 1011      Goals   Current Weight  178 lb 8 oz (81 kg)    Nutrition Goal  For heart healthy choices add >50% of whole grains, make half their plate fruits and vegetables. Discuss the difference between starchy vegetables and leafy greens, and how leafy vegetables provide fiber, helps maintain healthy weight, helps control blood glucose, and lowers cholesterol.  Discuss purchasing fresh or frozen vegetable to reduce sodium and not to add grease, fat or sugar. Consume <18oz of red meat per week. Consume lean cuts of meats and very little of meats high in sodium and nitrates such as pork and lunch meats. Discussed portion control for all food groups.     Comment  Patient has gained one lb since his last 30 day review. He admitts to eating more pork lately which is causing elevated blood pressure. We told his son and he says he would  monitor his diet more closely. Will continue to monitor.     Expected Outcome  Patient will continue to eat a heart healthy diet.       Personal Goal #2 Re-Evaluation   Personal Goal #2  Patient's family (son and daughter) are helping him with a heart healthy diet.        Psychosocial: Target Goals: Acknowledge presence or absence of significant depression and/or stress, maximize coping skills, provide positive support system. Participant is able to verbalize types and ability to use techniques and skills needed for reducing stress and  depression.  Initial Review & Psychosocial Screening: Initial Psych Review & Screening - 08/24/17 1020      Initial Review   Current issues with  None Identified      Family Dynamics   Good Support System?  Yes      Barriers   Psychosocial barriers to participate in program  There are no identifiable barriers or psychosocial needs.      Screening Interventions   Interventions  Encouraged to exercise    Expected Outcomes  Short Term goal: Identification and review with participant of any Quality of Life or Depression concerns found by scoring the questionnaire.;Long Term goal: The participant improves quality of Life and PHQ9 Scores as seen by post scores and/or verbalization of changes       Quality of Life Scores: Quality of Life - 12/13/17 1437      Quality of Life   Select  Quality of Life      Quality of Life Scores   Health/Function Pre  22.83 %    Health/Function Post  25.2 %    Health/Function % Change  10.38 %    Socioeconomic Pre  23.88 %    Socioeconomic Post  25.71 %    Socioeconomic % Change   7.66 %    Psych/Spiritual Pre  29.14 %    Psych/Spiritual Post  30 %    Psych/Spiritual % Change  2.95 %    Family Pre  28.8 %    Family Post  30 %    Family % Change  4.17 %    GLOBAL Pre  25.19 %    GLOBAL Post  26.91 %    GLOBAL % Change  6.83 %      Scores of 19 and below usually indicate a poorer quality of life in these areas.  A difference of  2-3 points is a clinically meaningful difference.  A difference of 2-3 points in the total score of the Quality of Life Index has been associated with significant improvement in overall quality of life, self-image, physical symptoms, and general health in studies assessing change in quality of life.  PHQ-9: Recent Review Flowsheet Data    Depression screen [facility] 2/9 12/13/2017 08/24/2017   Decreased Interest 0 0   Down, Depressed, Hopeless 0 0   PHQ - 2 Score 0 0   Altered sleeping 0 0   Tired, decreased energy 0 0    Change in appetite 0 0   Feeling bad or failure about yourself  0 0   Trouble concentrating 0 0   Moving slowly or fidgety/restless 0 0   Suicidal thoughts 0 0   PHQ-9 Score 0 0   Difficult doing work/chores - Not difficult at all     Interpretation of Total Score  Total Score Depression Severity:  1-4 = Minimal depression, 5-9 = Mild depression, 10-14 = Moderate depression, 15-19 = Moderately severe depression,  20-27 = Severe depression   Psychosocial Evaluation and Intervention: Psychosocial Evaluation - 12/13/17 1449      Discharge Psychosocial Assessment & Intervention   Comments  Patient has no psychosocial issues identified at discharge. His exit PHQ-9 score remained the same at 0 and his exit QOL score improved by 6.83 % at 26.91%.        Psychosocial Re-Evaluation: Psychosocial Re-Evaluation    [place] Name 09/20/17 1304 10/09/17 1406 11/02/17 1018         Psychosocial Re-Evaluation   Current issues with  None Identified  None Identified  None Identified     Comments  Patient's initial QOL score was 25.19 and his PHQ-9 score was 0 with no psychosocial issues identified.   Patient's initial QOL score was 25.19 and his PHQ-9 score was 0 with no psychosocial issues identified.   Patient's initial QOL score was 25.19 and his PHQ-9 score was 0 with no psychosocial issues identified.      Expected Outcomes  Patient will have no psychosocial issues identified at discharge.   Patient will have no psychosocial issues identified at discharge.   Patient will have no psychosocial issues identified at discharge.      Interventions  Stress management education;Encouraged to attend Cardiac Rehabilitation for the exercise;Relaxation education  Stress management education;Encouraged to attend Cardiac Rehabilitation for the exercise;Relaxation education  Stress management education;Encouraged to attend Cardiac Rehabilitation for the exercise;Relaxation education     Continue Psychosocial Services    No Follow up required  No Follow up required  No Follow up required        Psychosocial Discharge (Final Psychosocial Re-Evaluation): Psychosocial Re-Evaluation - 11/02/17 1018      Psychosocial Re-Evaluation   Current issues with  None Identified    Comments  Patient's initial QOL score was 25.19 and his PHQ-9 score was 0 with no psychosocial issues identified.     Expected Outcomes  Patient will have no psychosocial issues identified at discharge.     Interventions  Stress management education;Encouraged to attend Cardiac Rehabilitation for the exercise;Relaxation education    Continue Psychosocial Services   No Follow up required       Vocational Rehabilitation: Provide vocational rehab assistance to qualifying candidates.   Vocational Rehab Evaluation & Intervention: Vocational Rehab - 08/24/17 1014      Initial Vocational Rehab Evaluation & Intervention   Assessment shows need for Vocational Rehabilitation  No       Education: Education Goals: Education classes will be provided on a weekly basis, covering required topics. Participant will state understanding/return demonstration of topics presented.  Learning Barriers/Preferences: Learning Barriers/Preferences - 08/24/17 1012      Learning Barriers/Preferences   Learning Barriers  Hearing    Learning Preferences  Video;Written Material;Pictoral;Computer/Internet       Education Topics: Hypertension, Hypertension Reduction -Define heart disease and high blood pressure. Discus how high blood pressure affects the body and ways to reduce high blood pressure.   CARDIAC REHAB PHASE II EXERCISE from 11/15/2017 in [place]  Date  09/27/17  Educator  Russella Dar  Instruction Review Code  2- Demonstrated Understanding      Exercise and Your Heart -Discuss why it is important to exercise, the FITT principles of exercise, normal and abnormal responses to exercise, and how to exercise safely.    CARDIAC REHAB PHASE II EXERCISE from 11/15/2017 in [place]  Date  10/04/17  Educator  [place]  Instruction Review Code  2- Demonstrated  Understanding      Angina -Discuss definition of angina, causes of angina, treatment of angina, and how to decrease risk of having angina.   CARDIAC REHAB PHASE II EXERCISE from 11/15/2017 in [place]  Date  10/11/17  Educator  DC  Instruction Review Code  2- Demonstrated Understanding      Cardiac Medications -Review what the following cardiac medications are used for, how they affect the body, and side effects that may occur when taking the medications.  Medications include Aspirin, Beta blockers, calcium channel blockers, ACE Inhibitors, angiotensin receptor blockers, diuretics, digoxin, and antihyperlipidemics.   CARDIAC REHAB PHASE II EXERCISE from 11/15/2017 in [facility]  Date  10/18/17  Educator  DC  Instruction Review Code  2- Demonstrated Understanding      Congestive Heart Failure -Discuss the definition of CHF, how to live with CHF, the signs and symptoms of CHF, and how keep track of weight and sodium intake.   CARDIAC REHAB PHASE II EXERCISE from 11/15/2017 in [place]  Date  10/25/17  Educator  DC  Instruction Review Code  2- Demonstrated Understanding      Heart Disease and Intimacy -Discus the effect sexual activity has on the heart, how changes occur during intimacy as we age, and safety during sexual activity.   CARDIAC REHAB PHASE II EXERCISE from 11/15/2017 in [place]  Date  08/30/17  Educator  DJ  Instruction Review Code  2- Demonstrated Understanding      Smoking Cessation / COPD -Discuss different methods to quit smoking, the health benefits of quitting smoking, and the definition of COPD.   CARDIAC REHAB PHASE II EXERCISE from 11/15/2017 in [place]  Date  11/08/17  Educator   DJ  Instruction Review Code  2- Demonstrated Understanding      Nutrition I: Fats -Discuss the types of cholesterol, what cholesterol does to the heart, and how cholesterol levels can be controlled.   Nutrition II: Labels -Discuss the different components of food labels and how to read food label   CARDIAC REHAB PHASE II EXERCISE from 11/15/2017 in [place]  Date  11/15/17  Educator  DC  Instruction Review Code  2- Demonstrated Understanding      Heart Parts/Heart Disease and PAD -Discuss the anatomy of the heart, the pathway of blood circulation through the heart, and these are affected by heart disease.   Stress I: Signs and Symptoms -Discuss the causes of stress, how stress may lead to anxiety and depression, and ways to limit stress.   CARDIAC REHAB PHASE II EXERCISE from 11/15/2017 in [facility]  Date  09/06/17  Educator  [place]  Instruction Review Code  2- Demonstrated Understanding      Stress II: Relaxation -Discuss different types of relaxation techniques to limit stress.   CARDIAC REHAB PHASE II EXERCISE from 11/15/2017 in [place]  Date  09/13/17  Educator  DJ  Instruction Review Code  2- Demonstrated Understanding      Warning Signs of Stroke / TIA -Discuss definition of a stroke, what the signs and symptoms are of a stroke, and how to identify when someone is having stroke.   CARDIAC REHAB PHASE II EXERCISE from 11/15/2017 in [facility]  Date  09/20/17  Educator  DC  Instruction Review Code  2- Demonstrated Understanding      Knowledge Questionnaire Score: Knowledge Questionnaire Score - 12/13/17 1416  Knowledge Questionnaire Score   Pre Score  20/24    Post Score  22/24       Core Components/Risk Factors/Patient Goals at Admission: Personal Goals and Risk Factors at Admission - 08/24/17 1018      Core Components/Risk Factors/Patient Goals on Admission     Weight Management  Weight Maintenance    Personal Goal Other  Yes    Personal Goal  Become more heart healthy and breathe better.     Intervention  Attend CR 3 x week and supplement with home exercise 2 x week.    Expected Outcomes  Reach personal goals       Core Components/Risk Factors/Patient Goals Review:  Goals and Risk Factor Review    Row Name 09/20/17 1301 10/09/17 1404 11/02/17 1013 12/13/17 1444       Core Components/Risk Factors/Patient Goals Review   Personal Goals Review  Weight Management/Obesity Breathe better; be heart healthy.   Weight Management/Obesity Breathe better; be heart healthy.  Weight Management/Obesity;Improve shortness of breath with ADL's Breathe better; be heart healthy.   Weight Management/Obesity;Improve shortness of breath with ADL's Breathe better; be heart healhty.    Review  Patient has completed 12 sessions gaining 2.5 lbs since he started the program. He is doing well in the program with some progression. He says he feels the program has helped him so far with increased energy. He has not seen an improvement in his breathing. He hopes to see more improvement at he continues. Will continue to monitor for progress.   Patient has completed 20 sessions losing 1 lbs since his initial visit. He is doing well in the program with progression. He continues to say he feels stronger and he has more energy. He says he feels less SOB with activity. His balance has also improved along with his overall gait. He is now independent with the equipment. Will continue to monitor for progress.   Patient has completed 29 session maintaining his weight since his initial visit. He continues to do well in the program with progression. He does admitt to eating more pork lately and we have encouraged him to cut back becuase it is effecting his blood pressure. We also spoke to his son about this and he said he would monitor this more closley. Patient says he continues to feel stronger  and his balance and walking have improved. He says he is noticing less SOB now with activity. He feels the program is helping him. Will continue to monitor for progress.   Patient graduated with 36 sessions maintaining his weight since his initial visit. He did well in the program stating he feels better overall and feels stronger with improved balance and breathing. His exit measurements improved in grip strength. His waist circumference decreased by 1 inch. He exit walk test remained the same. His diet did not improve. His medficts score increased by 15. He says he is going to try and cut back on his pork and red meat. He says he is exercising 6 days/week and plans to continue exercising at home by walking.     Expected Outcomes  Patient will continue to attend sessions and complete the program meeting his personal goals.   Patient will continue to attend sessions and complete the program meeting his personal goals.   Patient will continue to attend sessions and complete the program meeting his personal goals.   Patient will continue exercising at home by walking and continue to meet his personal goals. CR will  f/u for one year.        Core Components/Risk Factors/Patient Goals at Discharge (Final Review):  Goals and Risk Factor Review - 12/13/17 1444      Core Components/Risk Factors/Patient Goals Review   Personal Goals Review  Weight Management/Obesity;Improve shortness of breath with ADL's Breathe better; be heart healhty.    Review  Patient graduated with 36 sessions maintaining his weight since his initial visit. He did well in the program stating he feels better overall and feels stronger with improved balance and breathing. His exit measurements improved in grip strength. His waist circumference decreased by 1 inch. He exit walk test remained the same. His diet did not improve. His medficts score increased by 15. He says he is going to try and cut back on his pork and red meat. He says he is  exercising 6 days/week and plans to continue exercising at home by walking.     Expected Outcomes  Patient will continue exercising at home by walking and continue to meet his personal goals. CR will f/u for one year.        ITP Comments: ITP Comments    Row Name 08/24/17 1016 08/24/17 1046 08/24/17 1247 09/21/17 1620 11/29/17 1343   ITP Comments  Patient is been referred to Cardiac Rehab by Dr. Irish Lack due to NSTEMI and Stent placement.   Patient is beening referred to Cardiac Rehab by Dr. Irish Lack due to NSTEMI and Stent placement.   Patient new to program. Plans to start Monday 08/28/17. Will continue to monitor for progress.   Patient attend family matters class with hospital chaplain to discuss how this event has impacted their life and family.  Patient graduated 11/17/17. We are waiting on his discharge documentation to complete his discharge.       Comments: Patient graduated from [facility] today on 11/17/17 after completing 36 sessions. He achieved LTG of 30 minutes of aerobic exercise at Max Met level of 2.1. All patients vitals are WNL. Patient has met with dietician. Discharge instruction has been reviewed in detail and patient stated an understanding of material given. Patient plans to continue exercising at home by walking. Cardiac Rehab staff will make f/u calls at 1 month, 6 months, and 1 year. Patient had no complaints of any abnormal S/S or pain on their exit visit.

## 2017-12-13 NOTE — Addendum Note (Signed)
Encounter addended by: Clotilde Dieter on: 12/13/2017 2:53 PM  Actions taken: Flowsheet data copied forward, Visit Navigator Flowsheet section accepted

## 2017-12-13 NOTE — Addendum Note (Signed)
Encounter addended by: Dwana Melena, RN on: 12/13/2017 2:59 PM  Actions taken: Flowsheet data copied forward, Pend clinical note, Visit Navigator Flowsheet section accepted, Sign clinical note, Episode resolved

## 2017-12-13 NOTE — Addendum Note (Signed)
Encounter addended by: Dwana Melena, RN on: 12/13/2017 3:05 PM  Actions taken: Flowsheet data copied forward

## 2018-03-13 ENCOUNTER — Ambulatory Visit: Payer: Medicare Other | Admitting: Cardiology

## 2018-03-15 ENCOUNTER — Encounter: Payer: Self-pay | Admitting: Cardiology

## 2018-03-15 ENCOUNTER — Ambulatory Visit (INDEPENDENT_AMBULATORY_CARE_PROVIDER_SITE_OTHER): Payer: Medicare Other | Admitting: Cardiology

## 2018-03-15 VITALS — BP 124/74 | HR 72 | Ht 70.0 in | Wt 178.0 lb

## 2018-03-15 DIAGNOSIS — E782 Mixed hyperlipidemia: Secondary | ICD-10-CM

## 2018-03-15 DIAGNOSIS — I10 Essential (primary) hypertension: Secondary | ICD-10-CM | POA: Diagnosis not present

## 2018-03-15 DIAGNOSIS — I2510 Atherosclerotic heart disease of native coronary artery without angina pectoris: Secondary | ICD-10-CM | POA: Diagnosis not present

## 2018-03-15 NOTE — Progress Notes (Signed)
Clinical Summary [name] is a 82 y.o.male seen today for follow up of the following medical problems.   1. CAD - admitted with NSTEMI 06/2017. Cath as reported below, received DES to LCX. - 06/2017 echo VLEF 55-60%, grade II diastolic dysfunciton - changed from brillinta to plavix due to cost - lopressor stopped due to bradycardia, down to 40s during cardiac rehab.   - denies any chest pain. NO recent SOB/DOE - compliant with meds. Completed cardiac rehab    2. HTN - he is compliant with meds  3. Hyperlipidemia - changed from simva to atorva during 06/2017 admission  - recent labs with pcp  4. COPD - followed by pcp Past Medical History:  Diagnosis Date  . CHF (congestive heart failure) ([place])   . COPD (chronic obstructive pulmonary disease) ([place])   . GERD (gastroesophageal reflux disease)   . Hyperlipidemia   . Lung cancer ([place]) 2009  . NSTEMI (non-ST elevated myocardial infarction) ([place]) 07/16/2017   2/19 PCI/DES x1 to Lcx with moderate (50%) in the RCA, mild (20%) in LAD     Allergies  Allergen Reactions  . No Allergies On File      Current Outpatient Medications  Medication Sig Dispense Refill  . albuterol (PROVENTIL HFA;VENTOLIN HFA) 108 (90 Base) MCG/ACT inhaler Inhale 2 puffs into the lungs 4 (four) times daily.     Marland Kitchen aspirin EC 81 MG EC tablet Take 1 tablet (81 mg total) by mouth daily.    Marland Kitchen atorvastatin (LIPITOR) 80 MG tablet Take 1 tablet (80 mg total) by mouth daily at 6 PM. 90 tablet 3  . clopidogrel (PLAVIX) 75 MG tablet Take 75 mg by mouth daily.    . furosemide (LASIX) 20 MG tablet Take 20 mg by mouth daily.    Marland Kitchen lisinopril (PRINIVIL,ZESTRIL) 10 MG tablet Take 1 tablet (10 mg total) by mouth daily. 30 tablet 6  . meclizine (ANTIVERT) 12.5 MG tablet Take 12.5 mg by mouth daily.  1  . niacin 500 MG tablet Take 500 mg by mouth daily.     . nitroGLYCERIN (NITROSTAT) 0.4 MG SL tablet Place 1 tablet (0.4 mg total) under the tongue every 5  (five) minutes x 3 doses as needed for chest pain. 25 tablet 2  . pantoprazole (PROTONIX) 40 MG tablet Take 40 mg by mouth daily.     No current facility-administered medications for this visit.      Past Surgical History:  Procedure Laterality Date  . ANKLE FRACTURE SURGERY Right 1970s  . CATARACT EXTRACTION Left   . CORONARY ANGIOPLASTY WITH STENT PLACEMENT  07/19/2017  . CORONARY STENT INTERVENTION N/A 07/19/2017   Procedure: CORONARY STENT INTERVENTION;  Surgeon: [name], MD;  Location: [facility];  Service: Cardiovascular;  Laterality: N/A;  . FRACTURE SURGERY    . LEFT HEART CATH AND CORONARY ANGIOGRAPHY N/A 07/19/2017   Procedure: LEFT HEART CATH AND CORONARY ANGIOGRAPHY;  Surgeon: [name], MD;  Location: [facility];  Service: Cardiovascular;  Laterality: N/A;  . LUNG BIOPSY       Allergies  Allergen Reactions  . No Allergies On File       Family History  Problem Relation Age of Onset  . CAD Brother      Social History [name] reports that he quit smoking about 23 years ago. His smoking use included cigarettes. He has a 42.00 pack-year smoking history. He has never used smokeless tobacco. [name] reports  that he does not drink alcohol.   Review of Systems CONSTITUTIONAL: No weight loss, fever, chills, weakness or fatigue.  HEENT: Eyes: No visual loss, blurred vision, double vision or yellow sclerae.No hearing loss, sneezing, congestion, runny nose or sore throat.  SKIN: No rash or itching.  CARDIOVASCULAR: per hpi RESPIRATORY: No shortness of breath, cough or sputum.  GASTROINTESTINAL: No anorexia, nausea, vomiting or diarrhea. No abdominal pain or blood.  GENITOURINARY: No burning on urination, no polyuria NEUROLOGICAL: No headache, dizziness, syncope, paralysis, ataxia, numbness or tingling in the extremities. No change in bowel or bladder control.  MUSCULOSKELETAL: No muscle, back pain, joint pain or stiffness.    LYMPHATICS: No enlarged nodes. No history of splenectomy.  PSYCHIATRIC: No history of depression or anxiety.  ENDOCRINOLOGIC: No reports of sweating, cold or heat intolerance. No polyuria or polydipsia.  Marland Kitchen   Physical Examination Vitals:   03/15/18 1407  BP: 124/74  Pulse: 72  SpO2: 94%   Vitals:   03/15/18 1407  Weight: 178 lb (80.7 kg)  Height: 5\' 10"  (1.778 m)    Gen: resting comfortably, no acute distress HEENT: no scleral icterus, pupils equal round and reactive, no palptable cervical adenopathy,  CV: RRR, 2/6 systolic murmur rusb, no jvd Resp: Clear to auscultation bilaterally GI: abdomen is soft, non-tender, non-distended, normal bowel sounds, no hepatosplenomegaly MSK: extremities are warm, no edema.  Skin: warm, no rash Neuro:  no focal deficits Psych: appropriate affect   Diagnostic Studies 06/2017 cath  Prox Cx lesion is 30% stenosed.  Prox RCA to Mid RCA lesion is 55% stenosed.  Mid RCA lesion is 50% stenosed.  Prox LAD to Mid LAD lesion is 20% stenosed.  A stent was successfully placed.  Post intervention, there is a 0% residual stenosis.  Mid Cx lesion is 99% stenosed.  Multi vessel CAD with proximal irregularity of the LAD, 20% narrowing of the LAD in an intramyocardial segment beyond the diagonal vessel; mildly calcified proximal circumflex with 20-30% narrowing followed by a 99% stenosis after a bend in the mid vessel; 50-60% mid, and 50% stenosis at the acute margin of the RCA.  LVEDP 14 mmHg.  Successful percutaneous coronary intervention to the left circumflex vessel with ultimate insertion of a 3.016 mm Synergy DES stent postdilated 3.21 mm with a 99% stenosis being reduced to 0%.  RECOMMENDATION: DAPT for a minimum of 1 year. Medical therapy for concomitant CAD.  06/2017 echo Study Conclusions  - Left ventricle: The cavity size was normal. Systolic function was normal. The estimated ejection fraction was in the range of  55% to 60%. Mild hypokinesis of the basal-midinferolateral myocardium; consistent with ischemia in the distribution of the right coronary or left circumflex coronary artery. Features are consistent with a pseudonormal left ventricular filling pattern, with concomitant abnormal relaxation and increased filling pressure (grade 2 diastolic dysfunction). - Mitral valve: Calcified annulus.    Assessment and Plan  1. CAD - doing well without symptoms. Did not tolerate beta blocker due to bradycardia, otherwise he is on appropriate medical therapy - continue DAPT at least until 06/2018  2. Hyperlipidemia - continue statin, request labs from pcp  3. HTN - he is at goal, continue current meds   F/u 4 months. Likely d/c plavix 06/2018       Arnoldo Lenis, M.D.

## 2018-03-15 NOTE — Patient Instructions (Signed)
Your physician recommends that you schedule a follow-up appointment in: [place]  Your physician recommends that you continue on your current medications as directed. Please refer to the Current Medication list given to you today.  Thank you for choosing [facility]!!

## 2018-03-16 ENCOUNTER — Encounter: Payer: Self-pay | Admitting: *Deleted

## 2018-04-27 ENCOUNTER — Other Ambulatory Visit: Payer: Self-pay | Admitting: Cardiology

## 2018-07-18 ENCOUNTER — Encounter: Payer: Self-pay | Admitting: Cardiology

## 2018-07-18 ENCOUNTER — Ambulatory Visit (INDEPENDENT_AMBULATORY_CARE_PROVIDER_SITE_OTHER): Payer: Medicare Other | Admitting: Cardiology

## 2018-07-18 VITALS — BP 152/69 | HR 52 | Ht 72.0 in | Wt 177.4 lb

## 2018-07-18 DIAGNOSIS — I2510 Atherosclerotic heart disease of native coronary artery without angina pectoris: Secondary | ICD-10-CM

## 2018-07-18 DIAGNOSIS — I10 Essential (primary) hypertension: Secondary | ICD-10-CM | POA: Diagnosis not present

## 2018-07-18 DIAGNOSIS — R001 Bradycardia, unspecified: Secondary | ICD-10-CM

## 2018-07-18 DIAGNOSIS — E782 Mixed hyperlipidemia: Secondary | ICD-10-CM | POA: Diagnosis not present

## 2018-07-18 NOTE — Patient Instructions (Signed)
Your physician wants you to follow-up in: [place] will receive a reminder letter in the mail two months in advance. If you don't receive a letter, please call our office to schedule the follow-up appointment.  Your physician has recommended you make the following change in your medication:   STOP PLAVIX   Thank you for choosing [facility]!!

## 2018-07-18 NOTE — Progress Notes (Signed)
Clinical Summary Paul Snyder is a 83 y.o.male seen today for follow up of the following medical problems.   1. CAD - admitted with NSTEMI 06/2017. Cath as reported below, received DES to LCX. - 06/2017 echo VLEF 55-60%, grade II diastolic dysfunciton - changed from brillinta to plavix due to cost - lopressor stopped due to bradycardia, down to 40s during cardiac rehab.    - no recent chest pain. No SOB/DOE - compliant with meds  2. HTN - compliant with meds  3. Hyperlipidemia - changed from simva to atorva during 06/2017 admission - Jan 2020 TC 121 TG 83 HDL 33 LDL 71 - compliant with statin  4. COPD - followed by pcp   Past Medical History:  Diagnosis Date  . CHF (congestive heart failure) ([place])   . COPD (chronic obstructive pulmonary disease) ([place])   . GERD (gastroesophageal reflux disease)   . Hyperlipidemia   . Lung cancer ([place]) 2009  . NSTEMI (non-ST elevated myocardial infarction) ([place]) 07/16/2017   2/19 PCI/DES x1 to Lcx with moderate (50%) in the RCA, mild (20%) in LAD     Allergies  Allergen Reactions  . No Allergies On File      Current Outpatient Medications  Medication Sig Dispense Refill  . albuterol (PROVENTIL HFA;VENTOLIN HFA) 108 (90 Base) MCG/ACT inhaler Inhale 2 puffs into the lungs 4 (four) times daily.     Marland Kitchen aspirin EC 81 MG EC tablet Take 1 tablet (81 mg total) by mouth daily.    Marland Kitchen atorvastatin (LIPITOR) 80 MG tablet Take 1 tablet (80 mg total) by mouth daily at 6 PM. 90 tablet 3  . clopidogrel (PLAVIX) 75 MG tablet Take 75 mg by mouth daily.    . furosemide (LASIX) 20 MG tablet Take 20 mg by mouth daily.    Marland Kitchen lisinopril (PRINIVIL,ZESTRIL) 10 MG tablet TAKE 1 TABLET BY MOUTH ONCE DAILY STOPPING  LOPRESSOR 90 tablet 2  . meclizine (ANTIVERT) 12.5 MG tablet Take 12.5 mg by mouth daily.  1  . niacin 500 MG tablet Take 500 mg by mouth daily.     . nitroGLYCERIN (NITROSTAT) 0.4 MG SL tablet Place 1 tablet (0.4 mg total) under the tongue  every 5 (five) minutes x 3 doses as needed for chest pain. 25 tablet 2  . pantoprazole (PROTONIX) 40 MG tablet Take 40 mg by mouth daily.     No current facility-administered medications for this visit.      Past Surgical History:  Procedure Laterality Date  . ANKLE FRACTURE SURGERY Right 1970s  . CATARACT EXTRACTION Left   . CORONARY ANGIOPLASTY WITH STENT PLACEMENT  07/19/2017  . CORONARY STENT INTERVENTION N/A 07/19/2017   Procedure: CORONARY STENT INTERVENTION;  Surgeon: [name], MD;  Location: [facility];  Service: Cardiovascular;  Laterality: N/A;  . FRACTURE SURGERY    . LEFT HEART CATH AND CORONARY ANGIOGRAPHY N/A 07/19/2017   Procedure: LEFT HEART CATH AND CORONARY ANGIOGRAPHY;  Surgeon: [name], MD;  Location: [facility];  Service: Cardiovascular;  Laterality: N/A;  . LUNG BIOPSY       Allergies  Allergen Reactions  . No Allergies On File       Family History  Problem Relation Age of Onset  . CAD Brother      Social History Paul Snyder reports that he quit smoking about 24 years ago. His smoking use included cigarettes. He has a 42.00 pack-year smoking history. He has never used  smokeless tobacco. Paul Snyder reports no history of alcohol use.   Review of Systems CONSTITUTIONAL: No weight loss, fever, chills, weakness or fatigue.  HEENT: Eyes: No visual loss, blurred vision, double vision or yellow sclerae.No hearing loss, sneezing, congestion, runny nose or sore throat.  SKIN: No rash or itching.  CARDIOVASCULAR: per hpi RESPIRATORY: No shortness of breath, cough or sputum.  GASTROINTESTINAL: No anorexia, nausea, vomiting or diarrhea. No abdominal pain or blood.  GENITOURINARY: No burning on urination, no polyuria NEUROLOGICAL: No headache, dizziness, syncope, paralysis, ataxia, numbness or tingling in the extremities. No change in bowel or bladder control.  MUSCULOSKELETAL: No muscle, back pain, joint pain or stiffness.    LYMPHATICS: No enlarged nodes. No history of splenectomy.  PSYCHIATRIC: No history of depression or anxiety.  ENDOCRINOLOGIC: No reports of sweating, cold or heat intolerance. No polyuria or polydipsia.  Marland Kitchen   Physical Examination Today's Vitals   07/18/18 1350  BP: (!) 152/69  Pulse: (!) 52  SpO2: 95%  Weight: 177 lb 6.4 oz (80.5 kg)  Height: 6' (1.829 m)   Body mass index is 24.06 kg/m.  Gen: resting comfortably, no acute distress HEENT: no scleral icterus, pupils equal round and reactive, no palptable cervical adenopathy,  CV: RRR, no mrg, no jvd Resp: Clear to auscultation bilaterally GI: abdomen is soft, non-tender, non-distended, normal bowel sounds, no hepatosplenomegaly MSK: extremities are warm, no edema.  Skin: warm, no rash Neuro:  no focal deficits Psych: appropriate affect   Diagnostic Studies 06/2017 cath  Prox Cx lesion is 30% stenosed.  Prox RCA to Mid RCA lesion is 55% stenosed.  Mid RCA lesion is 50% stenosed.  Prox LAD to Mid LAD lesion is 20% stenosed.  A stent was successfully placed.  Post intervention, there is a 0% residual stenosis.  Mid Cx lesion is 99% stenosed.  Multi vessel CAD with proximal irregularity of the LAD, 20% narrowing of the LAD in an intramyocardial segment beyond the diagonal vessel; mildly calcified proximal circumflex with 20-30% narrowing followed by a 99% stenosis after a bend in the mid vessel; 50-60% mid, and 50% stenosis at the acute margin of the RCA.  LVEDP 14 mmHg.  Successful percutaneous coronary intervention to the left circumflex vessel with ultimate insertion of a 3.016 mm Synergy DES stent postdilated 3.21 mm with a 99% stenosis being reduced to 0%.  RECOMMENDATION: DAPT for a minimum of 1 year. Medical therapy for concomitant CAD.  06/2017 echo Study Conclusions  - Left ventricle: The cavity size was normal. Systolic function was normal. The estimated ejection fraction was in the range of  55% to 60%. Mild hypokinesis of the basal-midinferolateral myocardium; consistent with ischemia in the distribution of the right coronary or left circumflex coronary artery. Features are consistent with a pseudonormal left ventricular filling pattern, with concomitant abnormal relaxation and increased filling pressure (grade 2 diastolic dysfunction). - Mitral valve: Calcified annulus.    Assessment and Plan   1. CAD - 1 year out from his PCI, we will stop plavix - no symptoms, continue other meds. No beta blocker due to bradycardia.   2. Hyperlipidemia - LDL at goal, continue statin  3. HTN - manual repeat 134/75, essentially at goal - continue current meds  4. Bradycardia  EKG today sinus brady 54, trifascicular block - no symptoms, continue to monitor his underlying conduction disease, currently not clinically significant.    F/u 6 months     Arnoldo Lenis, M.D.,

## 2018-11-07 ENCOUNTER — Other Ambulatory Visit: Payer: Self-pay | Admitting: Student

## 2018-12-21 ENCOUNTER — Telehealth: Payer: Self-pay | Admitting: Family Medicine

## 2018-12-31 NOTE — Progress Notes (Signed)
New Patient Office Visit  Assessment & Plan:  1. Essential hypertension - Well controlled on current regimen. Encouraged patient and daughter to get a blood pressure monitor and check BP randomly due to BP of 102/55 today. Discussed that if he BP is low this could cause dizziness and falls. Patient does have a prescription for meclizine 12.5 mg that he takes for dizziness on occasion. Explained that we need to make sure the dizziness is not related to his BP.  - lisinopril (ZESTRIL) 10 MG tablet; Take 1 tablet (10 mg total) by mouth daily.  Dispense: 90 tablet; Refill: 3  2. History of heart attack - Patient is taking atorvastatin 80 mg QD. He is followed and managed by cardiology (Dr. [name]).   3. Chronic obstructive pulmonary disease, unspecified COPD type ([place]) - Patient to stop using Albuterol every day scheduled with his other medications; advised that he only use it when he is feeling short of breath, wheezing, or chest tightness. Then he is to let me know if he is requiring the Albuterol more than twice per week so that a long acting medication for COPD can be prescribed for daily use.   4. Gastroesophageal reflux disease, esophagitis presence not specified - Well controlled on current regimen.   5. Immunization due - SHINGRIX injection; Inject 0.5 mLs into the muscle once for 1 dose.  Dispense: 0.5 mL; Refill: 0  6. Encounter to establish care   Follow-up: Return in about 6 months (around 07/06/2019) for follow-up of chronic medication conditions.   Hendricks Limes, MSN, APRN, FNP-C [facility]  Subjective:  Patient ID: Paul Snyder, male    DOB: [birth year]  Age: 83 y.o. MRN: [id number]  Patient Care Team: Loman Brooklyn, FNP as PCP - General (Family Medicine) [name] Alphonse Guild, MD as Consulting Physician (Cardiology)  CC:  Chief Complaint  Patient presents with  . New Patient (Initial Visit)    HPI Paul Snyder presents to establish care.  He is transferring care from Dr. Murrell Redden office as he has retired and the office has closed. Patient also is in need of a refill on his Lisinopril. He is accompanied today but his daughter, who he is okay with being present.   Patient is using Albuterol twice daily every day on a schedule, not when he is having respiratory symptoms.   Review of Systems  Constitutional: Negative for chills, fever, malaise/fatigue and weight loss.  HENT: Negative for congestion, ear discharge, ear pain, nosebleeds, sinus pain, sore throat and tinnitus.   Eyes: Negative for blurred vision, double vision, pain, discharge and redness.  Respiratory: Positive for shortness of breath (exertional). Negative for cough and wheezing.   Cardiovascular: Negative for chest pain, palpitations and leg swelling.  Gastrointestinal: Negative for abdominal pain, constipation, diarrhea, heartburn, nausea and vomiting.  Genitourinary: Negative for dysuria, frequency and urgency.       Denies trouble initiating a urine stream, weak stream, split stream, and dribbling.  Musculoskeletal: Negative for myalgias.  Skin: Negative for rash.  Neurological: Negative for dizziness, seizures, weakness and headaches.  Psychiatric/Behavioral: Negative for depression, substance abuse and suicidal ideas. The patient is not nervous/anxious.      Current Outpatient Medications:  .  albuterol (PROVENTIL HFA;VENTOLIN HFA) 108 (90 Base) MCG/ACT inhaler, Inhale 2 puffs into the lungs every 4 (four) hours as needed for wheezing or shortness of breath. , Disp: , Rfl:  .  aspirin EC 81 MG EC tablet, Take 1 tablet (81  mg total) by mouth daily., Disp: , Rfl:  .  atorvastatin (LIPITOR) 80 MG tablet, TAKE 1 TABLET BY MOUTH DAILY AT 6 PM, Disp: 90 tablet, Rfl: 1 .  furosemide (LASIX) 20 MG tablet, Take 20 mg by mouth daily., Disp: , Rfl:  .  lisinopril (ZESTRIL) 10 MG tablet, Take 1 tablet (10 mg total) by mouth daily., Disp: 90 tablet, Rfl: 3 .  niacin 500  MG tablet, Take 500 mg by mouth daily. , Disp: , Rfl:  .  pantoprazole (PROTONIX) 40 MG tablet, Take 40 mg by mouth daily., Disp: , Rfl:  .  meclizine (ANTIVERT) 12.5 MG tablet, Take 12.5 mg by mouth daily as needed for dizziness. , Disp: , Rfl: 1 .  SHINGRIX injection, Inject 0.5 mLs into the muscle once for 1 dose., Disp: 0.5 mL, Rfl: 0  Allergies  Allergen Reactions  . No Allergies On File     Past Medical History:  Diagnosis Date  . CHF (congestive heart failure) ([place])   . COPD (chronic obstructive pulmonary disease) ([place])   . GERD (gastroesophageal reflux disease)   . Lung cancer ([place]) 2009  . NSTEMI (non-ST elevated myocardial infarction) ([place]) 07/16/2017   2/19 PCI/DES x1 to Lcx with moderate (50%) in the RCA, mild (20%) in LAD    Past Surgical History:  Procedure Laterality Date  . ANKLE FRACTURE SURGERY Right 1970s  . CATARACT EXTRACTION Left   . CORONARY ANGIOPLASTY WITH STENT PLACEMENT  07/19/2017  . CORONARY STENT INTERVENTION N/A 07/19/2017   Procedure: CORONARY STENT INTERVENTION;  Surgeon: [name], MD;  Location: [facility];  Service: Cardiovascular;  Laterality: N/A;  . LEFT HEART CATH AND CORONARY ANGIOGRAPHY N/A 07/19/2017   Procedure: LEFT HEART CATH AND CORONARY ANGIOGRAPHY;  Surgeon: [name], MD;  Location: [facility];  Service: Cardiovascular;  Laterality: N/A;  . LUNG BIOPSY      Family History  Problem Relation Age of Onset  . Healthy Mother   . Breast cancer Sister   . Heart disease Brother   . Heart failure Brother   . Breast cancer Sister   . Hypertension Son   . Hyperlipidemia Son     Social History   Socioeconomic History  . Marital status: Widowed    Spouse name: Not on file  . Number of children: Not on file  . Years of education: Not on file  . Highest education level: Not on file  Occupational History  . Not on file  Social Needs  . Financial resource strain: Not on file  . Food insecurity    Worry:  Not on file    Inability: Not on file  . Transportation needs    Medical: Not on file    Non-medical: Not on file  Tobacco Use  . Smoking status: Former Smoker    Packs/day: 1.00    Years: 42.00    Pack years: 42.00    Types: Cigarettes    Quit date: 1996    Years since quitting: 24.6  . Smokeless tobacco: Never Used  Substance and Sexual Activity  . Alcohol use: No    Frequency: Never  . Drug use: Not Currently  . Sexual activity: Never  Lifestyle  . Physical activity    Days per week: Not on file    Minutes per session: Not on file  . Stress: Not on file  Relationships  . Social connections    Talks on phone: Not on file  Gets together: Not on file    Attends religious service: Not on file    Active member of club or organization: Not on file    Attends meetings of clubs or organizations: Not on file    Relationship status: Not on file  . Intimate partner violence    Fear of current or ex partner: Not on file    Emotionally abused: Not on file    Physically abused: Not on file    Forced sexual activity: Not on file  Other Topics Concern  . Not on file  Social History Narrative  . Not on file    Objective:   Today's Vitals: BP (!) 102/55   Pulse (!) 53   Temp (!) 96.3 F (35.7 C)   Wt 169 lb (76.7 kg)   BMI 22.92 kg/m   Physical Exam Vitals signs reviewed.  Constitutional:      General: He is not in acute distress.    Appearance: Normal appearance. He is normal weight. He is not ill-appearing, toxic-appearing or diaphoretic.  HENT:     Head: Normocephalic and atraumatic.     Right Ear: Tympanic membrane, ear canal and external ear normal. There is no impacted cerumen.     Left Ear: Tympanic membrane, ear canal and external ear normal. There is no impacted cerumen.     Nose: Nose normal. No congestion or rhinorrhea.     Mouth/Throat:     Mouth: Mucous membranes are moist.     Pharynx: Oropharynx is clear. No oropharyngeal exudate or posterior  oropharyngeal erythema.  Eyes:     General: No scleral icterus.       Right eye: No discharge.        Left eye: No discharge.     Conjunctiva/sclera: Conjunctivae normal.     Pupils: Pupils are equal, round, and reactive to light.  Neck:     Musculoskeletal: Normal range of motion and neck supple. No neck rigidity or muscular tenderness.  Cardiovascular:     Rate and Rhythm: Normal rate and regular rhythm.     Heart sounds: Normal heart sounds. No murmur. No friction rub. No gallop.   Pulmonary:     Effort: Pulmonary effort is normal. No respiratory distress.     Breath sounds: Normal breath sounds. No stridor. No wheezing, rhonchi or rales.  Abdominal:     General: Abdomen is flat. Bowel sounds are normal. There is no distension.     Palpations: Abdomen is soft. There is no mass.     Tenderness: There is no abdominal tenderness. There is no guarding or rebound.     Hernia: No hernia is present.  Musculoskeletal: Normal range of motion.     Right lower leg: No edema.     Left lower leg: No edema.  Lymphadenopathy:     Cervical: No cervical adenopathy.  Skin:    General: Skin is warm and dry.     Capillary Refill: Capillary refill takes less than 2 seconds.  Neurological:     General: No focal deficit present.     Mental Status: He is alert and oriented to person, place, and time. Mental status is at baseline.  Psychiatric:        Mood and Affect: Mood normal.        Behavior: Behavior normal.        Thought Content: Thought content normal.        Judgment: Judgment normal.

## 2019-01-03 ENCOUNTER — Other Ambulatory Visit: Payer: Self-pay

## 2019-01-03 ENCOUNTER — Ambulatory Visit (INDEPENDENT_AMBULATORY_CARE_PROVIDER_SITE_OTHER): Payer: Medicare Other | Admitting: Family Medicine

## 2019-01-03 ENCOUNTER — Encounter: Payer: Self-pay | Admitting: Family Medicine

## 2019-01-03 VITALS — BP 102/55 | HR 53 | Temp 96.3°F | Wt 169.0 lb

## 2019-01-03 DIAGNOSIS — I10 Essential (primary) hypertension: Secondary | ICD-10-CM

## 2019-01-03 DIAGNOSIS — I2510 Atherosclerotic heart disease of native coronary artery without angina pectoris: Secondary | ICD-10-CM

## 2019-01-03 DIAGNOSIS — Z23 Encounter for immunization: Secondary | ICD-10-CM

## 2019-01-03 DIAGNOSIS — I252 Old myocardial infarction: Secondary | ICD-10-CM | POA: Diagnosis not present

## 2019-01-03 DIAGNOSIS — K219 Gastro-esophageal reflux disease without esophagitis: Secondary | ICD-10-CM | POA: Diagnosis not present

## 2019-01-03 DIAGNOSIS — J449 Chronic obstructive pulmonary disease, unspecified: Secondary | ICD-10-CM | POA: Diagnosis not present

## 2019-01-03 DIAGNOSIS — Z7689 Persons encountering health services in other specified circumstances: Secondary | ICD-10-CM

## 2019-01-03 MED ORDER — LISINOPRIL 10 MG PO TABS: 10.0000 mg | ORAL_TABLET | Freq: Every day | ORAL | 3 refills | Status: DC

## 2019-01-03 MED ORDER — SHINGRIX 50 MCG/0.5ML IM SUSR: 0.5000 mL | Freq: Once | INTRAMUSCULAR | 0 refills | Status: AC

## 2019-01-03 NOTE — Patient Instructions (Signed)
Preventive Care 75 Years and Older, Male Preventive care refers to lifestyle choices and visits with your health care provider that can promote health and wellness. This includes:  A yearly physical exam. This is also called an annual well check.  Regular dental and eye exams.  Immunizations.  Screening for certain conditions.  Healthy lifestyle choices, such as diet and exercise. What can I expect for my preventive care visit? Physical exam Your health care provider will check:  Height and weight. These may be used to calculate body mass index (BMI), which is a measurement that tells if you are at a healthy weight.  Heart rate and blood pressure.  Your skin for abnormal spots. Counseling Your health care provider may ask you questions about:  Alcohol, tobacco, and drug use.  Emotional well-being.  Home and relationship well-being.  Sexual activity.  Eating habits.  History of falls.  Memory and ability to understand (cognition).  Work and work Statistician. What immunizations do I need?  Influenza (flu) vaccine  This is recommended every year. Tetanus, diphtheria, and pertussis (Tdap) vaccine  You may need a Td booster every 10 years. Varicella (chickenpox) vaccine  You may need this vaccine if you have not already been vaccinated. Zoster (shingles) vaccine  You may need this after age 50. Pneumococcal conjugate (PCV13) vaccine  One dose is recommended after age 24. Pneumococcal polysaccharide (PPSV23) vaccine  One dose is recommended after age 33. Measles, mumps, and rubella (MMR) vaccine  You may need at least one dose of MMR if you were born in 1957 or later. You may also need a second dose. Meningococcal conjugate (MenACWY) vaccine  You may need this if you have certain conditions. Hepatitis A vaccine  You may need this if you have certain conditions or if you travel or work in places where you may be exposed to hepatitis A. Hepatitis B vaccine   You may need this if you have certain conditions or if you travel or work in places where you may be exposed to hepatitis B. Haemophilus influenzae type b (Hib) vaccine  You may need this if you have certain conditions. You may receive vaccines as individual doses or as more than one vaccine together in one shot (combination vaccines). Talk with your health care provider about the risks and benefits of combination vaccines. What tests do I need? Blood tests  Lipid and cholesterol levels. These may be checked every 5 years, or more frequently depending on your overall health.  Hepatitis C test.  Hepatitis B test. Screening  Lung cancer screening. You may have this screening every year starting at age 74 if you have a 30-pack-year history of smoking and currently smoke or have quit within the past 15 years.  Colorectal cancer screening. All adults should have this screening starting at age 57 and continuing until age 54. Your health care provider may recommend screening at age 47 if you are at increased risk. You will have tests every 1-10 years, depending on your results and the type of screening test.  Prostate cancer screening. Recommendations will vary depending on your family history and other risks.  Diabetes screening. This is done by checking your blood sugar (glucose) after you have not eaten for a while (fasting). You may have this done every 1-3 years.  Abdominal aortic aneurysm (AAA) screening. You may need this if you are a current or former smoker.  Sexually transmitted disease (STD) testing. Follow these instructions at home: Eating and drinking  Eat  a diet that includes fresh fruits and vegetables, whole grains, lean protein, and low-fat dairy products. Limit your intake of foods with high amounts of sugar, saturated fats, and salt.  Take vitamin and mineral supplements as recommended by your health care provider.  Do not drink alcohol if your health care provider  tells you not to drink.  If you drink alcohol: ? Limit how much you have to 0-2 drinks a day. ? Be aware of how much alcohol is in your drink. In the [place] one drink equals one 12 oz bottle of beer (355 mL), one 5 oz glass of wine (148 mL), or one 1 oz glass of hard liquor (44 mL). Lifestyle  Take daily care of your teeth and gums.  Stay active. Exercise for at least 30 minutes on 5 or more days each week.  Do not use any products that contain nicotine or tobacco, such as cigarettes, e-cigarettes, and chewing tobacco. If you need help quitting, ask your health care provider.  If you are sexually active, practice safe sex. Use a condom or other form of protection to prevent STIs (sexually transmitted infections).  Talk with your health care provider about taking a low-dose aspirin or statin. What's next?  Visit your health care provider once a year for a well check visit.  Ask your health care provider how often you should have your eyes and teeth checked.  Stay up to date on all vaccines. This information is not intended to replace advice given to you by your health care provider. Make sure you discuss any questions you have with your health care provider. Document Released: 06/05/2015 Document Revised: 05/03/2018 Document Reviewed: 05/03/2018 Elsevier Patient Education  2020 [employer] Inc.  

## 2019-01-14 ENCOUNTER — Telehealth: Payer: Self-pay | Admitting: Family Medicine

## 2019-01-14 ENCOUNTER — Telehealth: Payer: Self-pay | Admitting: *Deleted

## 2019-01-14 NOTE — Telephone Encounter (Signed)
Pt daughter aware and verbalized understanding. They will keep an eye on it and call us back to let us know how it is running.

## 2019-01-14 NOTE — Telephone Encounter (Signed)
Did she mention if that was before or after BP medication? If before, I would D/C Lisinopril.

## 2019-01-14 NOTE — Telephone Encounter (Addendum)
Pt's daughter Arrie Aran called to report pt is having continued hypotension BP this AM was 91/40  P 58 Please advise

## 2019-01-18 ENCOUNTER — Telehealth: Payer: Self-pay | Admitting: *Deleted

## 2019-01-18 NOTE — Telephone Encounter (Signed)
The patient verbally consented for a telehealth phone visit with [facility] and understands that his/her insurance company will be billed for the encounter.  Will have vitals & medications.

## 2019-01-23 ENCOUNTER — Telehealth (INDEPENDENT_AMBULATORY_CARE_PROVIDER_SITE_OTHER): Payer: Medicare Other | Admitting: Cardiology

## 2019-01-23 ENCOUNTER — Encounter: Payer: Self-pay | Admitting: Cardiology

## 2019-01-23 ENCOUNTER — Encounter: Payer: Self-pay | Admitting: *Deleted

## 2019-01-23 VITALS — BP 119/62 | HR 57 | Ht 72.0 in | Wt 170.0 lb

## 2019-01-23 DIAGNOSIS — R001 Bradycardia, unspecified: Secondary | ICD-10-CM

## 2019-01-23 DIAGNOSIS — I10 Essential (primary) hypertension: Secondary | ICD-10-CM

## 2019-01-23 DIAGNOSIS — I2510 Atherosclerotic heart disease of native coronary artery without angina pectoris: Secondary | ICD-10-CM | POA: Diagnosis not present

## 2019-01-23 DIAGNOSIS — E782 Mixed hyperlipidemia: Secondary | ICD-10-CM

## 2019-01-23 NOTE — Progress Notes (Signed)
Virtual Visit via Telephone Note   This visit type was conducted due to national recommendations for restrictions regarding the COVID-19 Pandemic (e.g. social distancing) in an effort to limit this patient's exposure and mitigate transmission in our community.  Due to his co-morbid illnesses, this patient is at least at moderate risk for complications without adequate follow up.  This format is felt to be most appropriate for this patient at this time.  The patient did not have access to video technology/had technical difficulties with video requiring transitioning to audio format only (telephone).  All issues noted in this document were discussed and addressed.  No physical exam could be performed with this format.  Please refer to the patient's chart for his  consent to telehealth for [facility].   Date:  01/23/2019   ID:  Paul Snyder, DOB 1935/07/16, MRN [id number]  Patient Location: Home Provider Location: Office  PCP:  Loman Brooklyn, FNP  Cardiologist:  Dr Carlyle Dolly Electrophysiologist:  None   Evaluation Performed:  Follow-Up Visit  Chief Complaint:  Follow up visit  History of Present Illness:    Paul Snyder is a 83 y.o. male seen today for follow up of the following medical problems.  1. CAD - admitted with NSTEMI 06/2017. Cath as reported below, received DES to LCX. - 06/2017 echo VLEF 55-60%, grade II diastolic dysfunciton - lopressor stopped due to bradycardia, down to 40s during cardiac rehab.   - no recent chest pain. Denies any SOB/DOE - compliant with meds. Lisinopril stopped ACEI due to low bp's by pcp. No beta blocker due to bradycardia    2. HTN - reports pcp stoppled his lisinorpil due to low bp's - since that time bp's have improved   3. Hyperlipidemia - changed from simva to atorva during 06/2017 admission - Jan 2020 TC 121 TG 83 HDL 33 LDL 71  - he is compliant with statin.   4. COPD - followed by pcp  5. Conduction  disease/bradycardia - isolated dizzy spell but no other symptoms   The patient does not have symptoms concerning for COVID-19 infection (fever, chills, cough, or new shortness of breath).    Past Medical History:  Diagnosis Date  . CHF (congestive heart failure) ([place])   . COPD (chronic obstructive pulmonary disease) ([place])   . GERD (gastroesophageal reflux disease)   . Lung cancer ([place]) 2009  . NSTEMI (non-ST elevated myocardial infarction) ([place]) 07/16/2017   2/19 PCI/DES x1 to Lcx with moderate (50%) in the RCA, mild (20%) in LAD   Past Surgical History:  Procedure Laterality Date  . ANKLE FRACTURE SURGERY Right 1970s  . CATARACT EXTRACTION Left   . CORONARY ANGIOPLASTY WITH STENT PLACEMENT  07/19/2017  . CORONARY STENT INTERVENTION N/A 07/19/2017   Procedure: CORONARY STENT INTERVENTION;  Surgeon: [name], MD;  Location: [place] [facility];  Service: Cardiovascular;  Laterality: N/A;  . LEFT HEART CATH AND CORONARY ANGIOGRAPHY N/A 07/19/2017   Procedure: LEFT HEART CATH AND CORONARY ANGIOGRAPHY;  Surgeon: [name], MD;  Location: [place] [facility];  Service: Cardiovascular;  Laterality: N/A;  . LUNG BIOPSY       No outpatient medications have been marked as taking for the 01/23/19 encounter (Appointment) with Paul Lenis, MD.     Allergies:   No allergies on file   Social History   Tobacco Use  . Smoking status: Former Smoker    Packs/day: 1.00    Years: 42.00  Pack years: 42.00    Types: Cigarettes    Quit date: 1996    Years since quitting: 24.6  . Smokeless tobacco: Never Used  Substance Use Topics  . Alcohol use: No    Frequency: Never  . Drug use: Not Currently     Family Hx: The patient's family history includes Breast cancer in his sister and sister; Healthy in his mother; Heart disease in his brother; Heart failure in his brother; Hyperlipidemia in his son; Hypertension in his son.  ROS:   Please see the history of present illness.      All other systems reviewed and are negative.   Prior CV studies:   The following studies were reviewed today:  06/2017 cath  Prox Cx lesion is 30% stenosed.  Prox RCA to Mid RCA lesion is 55% stenosed.  Mid RCA lesion is 50% stenosed.  Prox LAD to Mid LAD lesion is 20% stenosed.  A stent was successfully placed.  Post intervention, there is a 0% residual stenosis.  Mid Cx lesion is 99% stenosed.  Multi vessel CAD with proximal irregularity of the LAD, 20% narrowing of the LAD in an intramyocardial segment beyond the diagonal vessel; mildly calcified proximal circumflex with 20-30% narrowing followed by a 99% stenosis after a bend in the mid vessel; 50-60% mid, and 50% stenosis at the acute margin of the RCA.  LVEDP 14 mmHg.  Successful percutaneous coronary intervention to the left circumflex vessel with ultimate insertion of a 3.016 mm Synergy DES stent postdilated 3.21 mm with a 99% stenosis being reduced to 0%.  RECOMMENDATION: DAPT for a minimum of 1 year. Medical therapy for concomitant CAD.  06/2017 echo Study Conclusions  - Left ventricle: The cavity size was normal. Systolic function was normal. The estimated ejection fraction was in the range of 55% to 60%. Mild hypokinesis of the basal-midinferolateral myocardium; consistent with ischemia in the distribution of the right coronary or left circumflex coronary artery. Features are consistent with a pseudonormal left ventricular filling pattern, with concomitant abnormal relaxation and increased filling pressure (grade 2 diastolic dysfunction). - Mitral valve: Calcified annulus.  Labs/Other Tests and Data Reviewed:    EKG:  No ECG reviewed.  Recent Labs: No results found for requested labs within last 8760 hours.   Recent Lipid Panel Lab Results  Component Value Date/Time   CHOL 126 07/20/2017 12:12 AM   TRIG 117 07/20/2017 12:12 AM   HDL 32 (L) 07/20/2017 12:12 AM   CHOLHDL 3.9  07/20/2017 12:12 AM   LDLCALC 71 07/20/2017 12:12 AM    Wt Readings from Last 3 Encounters:  01/03/19 169 lb (76.7 kg)  07/18/18 177 lb 6.4 oz (80.5 kg)  03/15/18 178 lb (80.7 kg)     Objective:    Vital Signs:   Today's Vitals   01/23/19 0841  BP: 119/62  Pulse: (!) 57  Weight: 170 lb (77.1 kg)  Height: 6' (1.829 m)   Body mass index is 23.06 kg/m.  Normal affect. Normal speech pattern and tone. Comfortable, no apparent distress. No audible signs of SOB or wheezing.   ASSESSMENT & PLAN:     1. CAD -doing well without symptoms. Medical therapy limited by softy bp's and bradycardia, he is now off his ACEI and beta blocker - continue current meds  2. Hyperlipidemia - continue statin, request labs from pcp  3. HTN - at goal today, prevously low bp's but have resolved off of lisinopril - continue to monitor   4. Bradycardia  -  prior EKG with sinus brady, trifascicular block. Asymptomatic conduction disease with just mild bradycardia - no significant symptoms, continue to monitor.   COVID-19 Education: The signs and symptoms of COVID-19 were discussed with the patient and how to seek care for testing (follow up with PCP or arrange E-visit).  The importance of social distancing was discussed today.  Time:   Today, I have spent 19 minutes with the patient with telehealth technology discussing the above problems.     Medication Adjustments/Labs and Tests Ordered: Current medicines are reviewed at length with the patient today.  Concerns regarding medicines are outlined above.   Tests Ordered: No orders of the defined types were placed in this encounter.   Medication Changes: No orders of the defined types were placed in this encounter.   Follow Up:  Virtual Visit in 6 month(s)  Signed, Carlyle Dolly, MD  01/23/2019 8:20 AM    [place]

## 2019-01-23 NOTE — Patient Instructions (Signed)

## 2019-01-30 ENCOUNTER — Ambulatory Visit: Payer: Self-pay | Admitting: Family Medicine

## 2019-02-11 ENCOUNTER — Telehealth: Payer: Self-pay | Admitting: Family Medicine

## 2019-02-11 NOTE — Telephone Encounter (Signed)
Televisit appt made.  Patient's daughter aware

## 2019-02-13 ENCOUNTER — Encounter: Payer: Self-pay | Admitting: Family Medicine

## 2019-02-13 ENCOUNTER — Ambulatory Visit (INDEPENDENT_AMBULATORY_CARE_PROVIDER_SITE_OTHER): Payer: Medicare Other | Admitting: Family Medicine

## 2019-02-13 DIAGNOSIS — I10 Essential (primary) hypertension: Secondary | ICD-10-CM | POA: Diagnosis not present

## 2019-02-13 DIAGNOSIS — Z711 Person with feared health complaint in whom no diagnosis is made: Secondary | ICD-10-CM | POA: Diagnosis not present

## 2019-02-13 DIAGNOSIS — Z9189 Other specified personal risk factors, not elsewhere classified: Secondary | ICD-10-CM | POA: Diagnosis not present

## 2019-02-13 DIAGNOSIS — J449 Chronic obstructive pulmonary disease, unspecified: Secondary | ICD-10-CM

## 2019-02-13 NOTE — Progress Notes (Signed)
Virtual Visit via Telephone Note  I connected with Paul Snyder on 02/13/19 at 2:29 PM by telephone and verified that I am speaking with the correct person using two identifiers. Paul Snyder is currently located at home and his daughter is currently with him during this visit, which he is okay with. The provider, Loman Brooklyn, FNP is located in their home at time of visit.  I discussed the limitations, risks, security and privacy concerns of performing an evaluation and management service by telephone and the availability of in person appointments. I also discussed with the patient that there may be a patient responsible charge related to this service. The patient expressed understanding and agreed to proceed.  Subjective: PCP: Loman Brooklyn, FNP  Chief Complaint  Patient presents with  . Depression   Patient reports he sometimes worries about things and his daughter wanted to call and discuss to make sure everything is okay and this is not related to his blood pressure or COPD. Patient reports he worries about things such as his tractor battery when he needed a new one. Once he got his new battery, he quit worrying about it.   Daughter reports max BP since stopping the Lisinopril was 125/61. His lowest BP has been 93/46; on this day he re-checked a little later and it was 105/49. Daughter reports his average systolic is 176-160; his average diastolic is 73-71.   Patient has not been using his Albuterol inhaler as he has not experiencing any shortness of breath, wheezing, or tightness in his chest. The daughter reports when she calls him in the afternoons he sometimes sounds short of breath.   Daughter reports the patient does sometimes still stumble. He is only drinking coffee for breakfast, Pepsi for lunch, milk for dinner, and one glass of water each day.    ROS: Per HPI  Current Outpatient Medications:  .  albuterol (PROVENTIL HFA;VENTOLIN HFA) 108 (90 Base) MCG/ACT  inhaler, Inhale 2 puffs into the lungs every 4 (four) hours as needed for wheezing or shortness of breath. , Disp: , Rfl:  .  aspirin EC 81 MG EC tablet, Take 1 tablet (81 mg total) by mouth daily., Disp: , Rfl:  .  atorvastatin (LIPITOR) 80 MG tablet, TAKE 1 TABLET BY MOUTH DAILY AT 6 PM, Disp: 90 tablet, Rfl: 1 .  furosemide (LASIX) 20 MG tablet, Take 20 mg by mouth daily., Disp: , Rfl:  .  meclizine (ANTIVERT) 12.5 MG tablet, Take 12.5 mg by mouth daily as needed for dizziness. , Disp: , Rfl: 1 .  niacin 500 MG tablet, Take 500 mg by mouth daily. , Disp: , Rfl:  .  pantoprazole (PROTONIX) 40 MG tablet, Take 40 mg by mouth daily., Disp: , Rfl:   Allergies  Allergen Reactions  . No Allergies On File    Past Medical History:  Diagnosis Date  . CHF (congestive heart failure) ([place])   . COPD (chronic obstructive pulmonary disease) ([place])   . GERD (gastroesophageal reflux disease)   . Lung cancer ([place]) 2009  . NSTEMI (non-ST elevated myocardial infarction) ([place]) 07/16/2017   2/19 PCI/DES x1 to Lcx with moderate (50%) in the RCA, mild (20%) in LAD    Observations/Objective: A&O  No respiratory distress or wheezing audible over the phone Mood, judgement, and thought processes all WNL  Assessment and Plan: 1. Feared complaint without diagnosis - Patient worries about legitimate things. He does not worry excessively. Worrying does not impair function or  day to day life. Patient is not distressed with amount of worry.   2. Essential hypertension - Blood pressure doing well without Lisinopril.   3. Chronic obstructive pulmonary disease, unspecified COPD type ([place]) - Advised daughter she could trial Albuterol in the afternoon to see if she notices a difference in his breathing. Discussed that if he is using more than twice weekly we should prescribe a long acting inhaler. She will let me know how this goes.   4. At risk for dehydration due to poor fluid intake - Encouraged increased intake  of water as patient drinks multiple drinks with caffeine. Discussed setting himself a reminder to drink more such as every 30 minutes or during commercials. He likes this idea as he never feels thirsty. Discussed this is a normal part of aging.    Follow Up Instructions:  I discussed the assessment and treatment plan with the patient. The patient was provided an opportunity to ask questions and all were answered. The patient agreed with the plan and demonstrated an understanding of the instructions.   The patient was advised to call back or seek an in-person evaluation if the symptoms worsen or if the condition fails to improve as anticipated.  The above assessment and management plan was discussed with the patient. The patient verbalized understanding of and has agreed to the management plan. Patient is aware to call the clinic if symptoms persist or worsen. Patient is aware when to return to the clinic for a follow-up visit. Patient educated on when it is appropriate to go to the emergency department.   Time call ended: 2:45 PM  I provided 18 minutes of non-face-to-face time during this encounter.  Hendricks Limes, MSN, APRN, FNP-C [facility] 02/13/19

## 2019-03-12 ENCOUNTER — Emergency Department (HOSPITAL_COMMUNITY)
Admission: EM | Admit: 2019-03-12 | Discharge: 2019-03-12 | Disposition: A | Discharge status: Home / Self Care | Payer: Medicare Other | Attending: Emergency Medicine | Admitting: Emergency Medicine

## 2019-03-12 ENCOUNTER — Emergency Department (HOSPITAL_COMMUNITY): Payer: Medicare Other

## 2019-03-12 ENCOUNTER — Encounter (HOSPITAL_COMMUNITY): Payer: Self-pay | Admitting: Emergency Medicine

## 2019-03-12 DIAGNOSIS — S80811A Abrasion, right lower leg, initial encounter: Secondary | ICD-10-CM | POA: Diagnosis not present

## 2019-03-12 DIAGNOSIS — W230XXA Caught, crushed, jammed, or pinched between moving objects, initial encounter: Secondary | ICD-10-CM | POA: Diagnosis not present

## 2019-03-12 DIAGNOSIS — Z23 Encounter for immunization: Secondary | ICD-10-CM | POA: Insufficient documentation

## 2019-03-12 DIAGNOSIS — J449 Chronic obstructive pulmonary disease, unspecified: Secondary | ICD-10-CM | POA: Insufficient documentation

## 2019-03-12 DIAGNOSIS — Y999 Unspecified external cause status: Secondary | ICD-10-CM | POA: Insufficient documentation

## 2019-03-12 DIAGNOSIS — Z79899 Other long term (current) drug therapy: Secondary | ICD-10-CM | POA: Insufficient documentation

## 2019-03-12 DIAGNOSIS — R109 Unspecified abdominal pain: Secondary | ICD-10-CM | POA: Insufficient documentation

## 2019-03-12 DIAGNOSIS — S70311A Abrasion, right thigh, initial encounter: Secondary | ICD-10-CM | POA: Insufficient documentation

## 2019-03-12 DIAGNOSIS — I509 Heart failure, unspecified: Secondary | ICD-10-CM | POA: Insufficient documentation

## 2019-03-12 DIAGNOSIS — Z87891 Personal history of nicotine dependence: Secondary | ICD-10-CM | POA: Diagnosis not present

## 2019-03-12 DIAGNOSIS — I110 Hypertensive heart disease with heart failure: Secondary | ICD-10-CM | POA: Insufficient documentation

## 2019-03-12 DIAGNOSIS — S8781XA Crushing injury of right lower leg, initial encounter: Secondary | ICD-10-CM | POA: Diagnosis not present

## 2019-03-12 DIAGNOSIS — S30811A Abrasion of abdominal wall, initial encounter: Secondary | ICD-10-CM | POA: Insufficient documentation

## 2019-03-12 DIAGNOSIS — Y939 Activity, unspecified: Secondary | ICD-10-CM | POA: Insufficient documentation

## 2019-03-12 DIAGNOSIS — Y929 Unspecified place or not applicable: Secondary | ICD-10-CM | POA: Diagnosis not present

## 2019-03-12 DIAGNOSIS — Z7982 Long term (current) use of aspirin: Secondary | ICD-10-CM | POA: Diagnosis not present

## 2019-03-12 LAB — BASIC METABOLIC PANEL
Anion gap: 9 (ref 5–15)
BUN: 21 mg/dL (ref 8–23)
CO2: 27 mmol/L (ref 22–32)
Calcium: 9 mg/dL (ref 8.9–10.3)
Chloride: 101 mmol/L (ref 98–111)
Creatinine, Ser: 1.39 mg/dL — ABNORMAL HIGH (ref 0.61–1.24)
GFR calc Af Amer: 55 mL/min — ABNORMAL LOW (ref >60)
GFR calc non Af Amer: 47 mL/min — ABNORMAL LOW (ref >60)
Glucose, Bld: 185 mg/dL — ABNORMAL HIGH (ref 70–99)
Potassium: 4.2 mmol/L (ref 3.5–5.1)
Sodium: 137 mmol/L (ref 135–145)

## 2019-03-12 LAB — CBC WITH DIFFERENTIAL/PLATELET
Abs Immature Granulocytes: 0.04 10*3/uL (ref 0.00–0.07)
Basophils Absolute: 0 10*3/uL (ref 0.0–0.1)
Basophils Relative: 0 %
Eosinophils Absolute: 0.1 10*3/uL (ref 0.0–0.5)
Eosinophils Relative: 1 %
HCT: 42.5 % (ref 39.0–52.0)
Hemoglobin: 13.3 g/dL (ref 13.0–17.0)
Immature Granulocytes: 0 %
Lymphocytes Relative: 6 %
Lymphs Abs: 0.9 10*3/uL (ref 0.7–4.0)
MCH: 28.7 pg (ref 26.0–34.0)
MCHC: 31.3 g/dL (ref 30.0–36.0)
MCV: 91.6 fL (ref 80.0–100.0)
Monocytes Absolute: 1.1 10*3/uL — ABNORMAL HIGH (ref 0.1–1.0)
Monocytes Relative: 7 %
Neutro Abs: 12.9 10*3/uL — ABNORMAL HIGH (ref 1.7–7.7)
Neutrophils Relative %: 86 %
Platelets: 196 10*3/uL (ref 150–400)
RBC: 4.64 MIL/uL (ref 4.22–5.81)
RDW: 13.5 % (ref 11.5–15.5)
WBC: 15 10*3/uL — ABNORMAL HIGH (ref 4.0–10.5)
nRBC: 0 % (ref 0.0–0.2)

## 2019-03-12 LAB — CK: Total CK: 116 U/L (ref 49–397)

## 2019-03-12 MED ORDER — FENTANYL CITRATE (PF) 100 MCG/2ML IJ SOLN
50.0000 ug | Freq: Once | INTRAMUSCULAR | Status: AC
  Administered 2019-03-12: 16:00:00 50 ug via INTRAVENOUS
  Filled 2019-03-12: qty 2

## 2019-03-12 MED ORDER — SODIUM CHLORIDE 0.9 % IV BOLUS
1000.0000 mL | Freq: Once | INTRAVENOUS | Status: AC
  Administered 2019-03-12: 1000 mL via INTRAVENOUS

## 2019-03-12 MED ORDER — TETANUS-DIPHTH-ACELL PERTUSSIS 5-2.5-18.5 LF-MCG/0.5 IM SUSP
0.5000 mL | Freq: Once | INTRAMUSCULAR | Status: AC
  Administered 2019-03-12: 0.5 mL via INTRAMUSCULAR
  Filled 2019-03-12: qty 0.5

## 2019-03-12 MED ORDER — HYDROCODONE-ACETAMINOPHEN 5-325 MG PO TABS: 1.0000 | ORAL_TABLET | Freq: Four times a day (QID) | ORAL | 0 refills | Status: DC | PRN

## 2019-03-12 MED ORDER — IOHEXOL 300 MG/ML  SOLN
100.0000 mL | Freq: Once | INTRAMUSCULAR | Status: AC | PRN
  Administered 2019-03-12: 100 mL via INTRAVENOUS

## 2019-03-12 MED ORDER — HYDROCODONE-ACETAMINOPHEN 5-325 MG PO TABS
1.0000 | ORAL_TABLET | Freq: Once | ORAL | Status: AC
  Administered 2019-03-12: 1 via ORAL
  Filled 2019-03-12: qty 1

## 2019-03-12 NOTE — Discharge Instructions (Addendum)
Follow-up with your primary care doctor to discuss MRI of right femur as discussed.  Continue ice, Tylenol, Motrin, narcotic pain medicine.  Ambulate as tolerated.  Please return to the ED if symptoms worsen.
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
## 2019-03-12 NOTE — ED Notes (Signed)
Discharge instructions reviewed, pt verbalized understanding.
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
## 2019-03-12 NOTE — ED Notes (Signed)
Patient transported to CT 
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
## 2019-03-12 NOTE — ED Notes (Signed)
Patient transported to X-ray 

## 2019-03-16 ENCOUNTER — Other Ambulatory Visit: Payer: Self-pay | Admitting: Physician Assistant

## 2019-03-16 MED ORDER — CEPHALEXIN 250 MG PO CAPS: 250.0000 mg | ORAL_CAPSULE | Freq: Three times a day (TID) | ORAL | 0 refills | Status: DC

## 2019-03-16 NOTE — Progress Notes (Unsigned)
Received a phone call from his daughter Liam Graham, phone number [phone]. About a week ago he had a accident on his tractor and injured his leg and he has some infection in the wound now.  He was seen through the emergency room but they did not give him anything for infection.  She has been cleaning the wound with peroxide and using Neosporin.  But she does feel like it is not healing very well and getting some redness around it.  He has not had any fever or chills.  I have talked with the patient about stopping the peroxide for cleaning now that it can be taking away some of the healing tissues and to just use saline.  They can continue to use a nonstick pad if needed if it is draining very much.  And I will send in Keflex 250 mg 1 3 times a day for infection control.  Terald Sleeper PA-C [place] [address]  [place], [place] [address] [phone]

## 2019-03-19 ENCOUNTER — Other Ambulatory Visit: Payer: Self-pay | Admitting: *Deleted

## 2019-03-19 MED ORDER — PANTOPRAZOLE SODIUM 40 MG PO TBEC: 40.0000 mg | DELAYED_RELEASE_TABLET | Freq: Every day | ORAL | 1 refills | Status: DC

## 2019-04-03 ENCOUNTER — Ambulatory Visit (INDEPENDENT_AMBULATORY_CARE_PROVIDER_SITE_OTHER): Payer: Medicare Other | Admitting: Physician Assistant

## 2019-04-03 ENCOUNTER — Other Ambulatory Visit: Payer: Self-pay | Admitting: Family Medicine

## 2019-04-03 ENCOUNTER — Other Ambulatory Visit: Payer: Self-pay

## 2019-04-03 DIAGNOSIS — L03115 Cellulitis of right lower limb: Secondary | ICD-10-CM | POA: Diagnosis not present

## 2019-04-03 MED ORDER — SILVER SULFADIAZINE 1 % EX CREA: 1.0000 "application " | TOPICAL_CREAM | Freq: Every day | CUTANEOUS | 0 refills | Status: DC

## 2019-04-03 MED ORDER — CEPHALEXIN 250 MG PO CAPS: 250.0000 mg | ORAL_CAPSULE | Freq: Three times a day (TID) | ORAL | 1 refills | Status: DC

## 2019-04-03 NOTE — Telephone Encounter (Signed)
Daughter aware that a visit will need to be done with the provider. Televisit scheduled for today at 11:40/

## 2019-04-07 ENCOUNTER — Encounter: Payer: Self-pay | Admitting: Physician Assistant

## 2019-04-07 NOTE — Progress Notes (Signed)
Telephone visit  Subjective: VO:HYWVPXTGG of leg PCP: Loman Brooklyn, FNP YIR:SWNIO Paul Snyder is a 83 y.o. male calls for telephone consult today. Patient provides verbal consent for consult held via phone.  Patient is identified with 2 separate identifiers.  At this time the entire area is on COVID-19 social distancing and stay home orders are in place.  Patient is of higher risk and therefore we are performing this by a virtual method.  Location of patient: home Location of provider: HOME Others present for call: son  This patient is having a recheck on his injury and cellulitis of his leg.  Couple weeks ago he had a accident where her skin was scraped from his leg and quite deeply.  It happened when he got pinned under his tractor.  It even burned an area.  But it is healing up fairly nicely.  He did have a course of antibiotics but it has not completely gone away.  At this time there is still some signs of infection.  They did deny any fever or chills.   ROS: Per HPI  Allergies  Allergen Reactions  . No Allergies On File    Past Medical History:  Diagnosis Date  . CHF (congestive heart failure) ([place])   . COPD (chronic obstructive pulmonary disease) ([facility])   . GERD (gastroesophageal reflux disease)   . Lung cancer ([place]) 2009  . NSTEMI (non-ST elevated myocardial infarction) ([facility]) 07/16/2017   2/19 PCI/DES x1 to Lcx with moderate (50%) in the RCA, mild (20%) in LAD    Current Outpatient Medications:  .  albuterol (PROVENTIL HFA;VENTOLIN HFA) 108 (90 Base) MCG/ACT inhaler, Inhale 2 puffs into the lungs every 4 (four) hours as needed for wheezing or shortness of breath. , Disp: , Rfl:  .  aspirin EC 81 MG EC tablet, Take 1 tablet (81 mg total) by mouth daily., Disp: , Rfl:  .  atorvastatin (LIPITOR) 80 MG tablet, TAKE 1 TABLET BY MOUTH DAILY AT 6 PM, Disp: 90 tablet, Rfl: 1 .  cephALEXin (KEFLEX) 250 MG capsule, Take 1 capsule (250 mg total) by mouth 3 (three) times daily.,  Disp: 30 capsule, Rfl: 1 .  furosemide (LASIX) 20 MG tablet, Take 20 mg by mouth daily., Disp: , Rfl:  .  HYDROcodone-acetaminophen (NORCO/VICODIN) 5-325 MG tablet, Take 1 tablet by mouth every 6 (six) hours as needed for up to 15 doses., Disp: 15 tablet, Rfl: 0 .  meclizine (ANTIVERT) 12.5 MG tablet, Take 12.5 mg by mouth daily as needed for dizziness. , Disp: , Rfl: 1 .  niacin 500 MG tablet, Take 500 mg by mouth daily. , Disp: , Rfl:  .  pantoprazole (PROTONIX) 40 MG tablet, Take 1 tablet (40 mg total) by mouth daily., Disp: 90 tablet, Rfl: 1 .  silver sulfADIAZINE (SILVADENE) 1 % cream, Apply 1 application topically daily., Disp: 50 g, Rfl: 0  Assessment/ Plan: 83 y.o. male   1. Cellulitis of right lower extremity - cephALEXin (KEFLEX) 250 MG capsule; Take 1 capsule (250 mg total) by mouth 3 (three) times daily.  Dispense: 30 capsule; Refill: 1 - silver sulfADIAZINE (SILVADENE) 1 % cream; Apply 1 application topically daily.  Dispense: 50 g; Refill: 0   No follow-ups on file.  Continue all other maintenance medications as listed above.  Start time: 11:40 AM End time: 11:48 AM  Meds ordered this encounter  Medications  . cephALEXin (KEFLEX) 250 MG capsule    Sig: Take 1 capsule (250 mg  total) by mouth 3 (three) times daily.    Dispense:  30 capsule    Refill:  1    Order Specific Question:   Supervising Provider    Answer:   Janora Norlander [[id number]]  . silver sulfADIAZINE (SILVADENE) 1 % cream    Sig: Apply 1 application topically daily.    Dispense:  50 g    Refill:  0    Order Specific Question:   Supervising Provider    Answer:   Janora Norlander [[id number]]    Particia Nearing PA-C [place] [phone]

## 2019-04-15 ENCOUNTER — Other Ambulatory Visit: Payer: Self-pay

## 2019-04-15 ENCOUNTER — Encounter (HOSPITAL_COMMUNITY): Payer: Self-pay | Admitting: Emergency Medicine

## 2019-04-15 ENCOUNTER — Inpatient Hospital Stay (HOSPITAL_COMMUNITY)
Admission: EM | Admit: 2019-04-15 | Discharge: 2019-04-17 | DRG: 571 | Disposition: A | Discharge status: Home Health | Payer: Medicare Other | Source: Ambulatory Visit | Attending: Family Medicine | Admitting: Family Medicine

## 2019-04-15 ENCOUNTER — Encounter: Payer: Self-pay | Admitting: Family Medicine

## 2019-04-15 ENCOUNTER — Ambulatory Visit (INDEPENDENT_AMBULATORY_CARE_PROVIDER_SITE_OTHER): Payer: Medicare Other | Admitting: Family Medicine

## 2019-04-15 ENCOUNTER — Emergency Department (HOSPITAL_COMMUNITY): Payer: Medicare Other

## 2019-04-15 VITALS — BP 104/58 | HR 65 | Temp 96.6°F

## 2019-04-15 DIAGNOSIS — Z79899 Other long term (current) drug therapy: Secondary | ICD-10-CM

## 2019-04-15 DIAGNOSIS — I252 Old myocardial infarction: Secondary | ICD-10-CM

## 2019-04-15 DIAGNOSIS — S81801A Unspecified open wound, right lower leg, initial encounter: Secondary | ICD-10-CM | POA: Diagnosis not present

## 2019-04-15 DIAGNOSIS — Z87891 Personal history of nicotine dependence: Secondary | ICD-10-CM

## 2019-04-15 DIAGNOSIS — K219 Gastro-esophageal reflux disease without esophagitis: Secondary | ICD-10-CM | POA: Diagnosis present

## 2019-04-15 DIAGNOSIS — I2510 Atherosclerotic heart disease of native coronary artery without angina pectoris: Secondary | ICD-10-CM | POA: Diagnosis present

## 2019-04-15 DIAGNOSIS — J449 Chronic obstructive pulmonary disease, unspecified: Secondary | ICD-10-CM | POA: Diagnosis present

## 2019-04-15 DIAGNOSIS — I110 Hypertensive heart disease with heart failure: Secondary | ICD-10-CM | POA: Diagnosis present

## 2019-04-15 DIAGNOSIS — Z7982 Long term (current) use of aspirin: Secondary | ICD-10-CM

## 2019-04-15 DIAGNOSIS — T148XXA Other injury of unspecified body region, initial encounter: Secondary | ICD-10-CM

## 2019-04-15 DIAGNOSIS — S71101A Unspecified open wound, right thigh, initial encounter: Secondary | ICD-10-CM | POA: Diagnosis present

## 2019-04-15 DIAGNOSIS — S79921D Unspecified injury of right thigh, subsequent encounter: Secondary | ICD-10-CM | POA: Diagnosis not present

## 2019-04-15 DIAGNOSIS — D1621 Benign neoplasm of long bones of right lower limb: Secondary | ICD-10-CM | POA: Diagnosis present

## 2019-04-15 DIAGNOSIS — I5032 Chronic diastolic (congestive) heart failure: Secondary | ICD-10-CM | POA: Diagnosis present

## 2019-04-15 DIAGNOSIS — L089 Local infection of the skin and subcutaneous tissue, unspecified: Secondary | ICD-10-CM

## 2019-04-15 DIAGNOSIS — Z8249 Family history of ischemic heart disease and other diseases of the circulatory system: Secondary | ICD-10-CM

## 2019-04-15 DIAGNOSIS — R001 Bradycardia, unspecified: Secondary | ICD-10-CM | POA: Diagnosis present

## 2019-04-15 DIAGNOSIS — M87851 Other osteonecrosis, right femur: Secondary | ICD-10-CM | POA: Diagnosis present

## 2019-04-15 DIAGNOSIS — I10 Essential (primary) hypertension: Secondary | ICD-10-CM | POA: Diagnosis present

## 2019-04-15 DIAGNOSIS — Z85118 Personal history of other malignant neoplasm of bronchus and lung: Secondary | ICD-10-CM

## 2019-04-15 DIAGNOSIS — B957 Other staphylococcus as the cause of diseases classified elsewhere: Secondary | ICD-10-CM | POA: Diagnosis present

## 2019-04-15 DIAGNOSIS — Z955 Presence of coronary angioplasty implant and graft: Secondary | ICD-10-CM | POA: Diagnosis not present

## 2019-04-15 DIAGNOSIS — Z20828 Contact with and (suspected) exposure to other viral communicable diseases: Secondary | ICD-10-CM | POA: Diagnosis present

## 2019-04-15 DIAGNOSIS — Z720 Tobacco use: Secondary | ICD-10-CM

## 2019-04-15 LAB — CBC WITH DIFFERENTIAL/PLATELET
Abs Immature Granulocytes: 0.01 10*3/uL (ref 0.00–0.07)
Basophils Absolute: 0 10*3/uL (ref 0.0–0.1)
Basophils Relative: 0 %
Eosinophils Absolute: 0.1 10*3/uL (ref 0.0–0.5)
Eosinophils Relative: 1 %
HCT: 35 % — ABNORMAL LOW (ref 39.0–52.0)
Hemoglobin: 10.6 g/dL — ABNORMAL LOW (ref 13.0–17.0)
Immature Granulocytes: 0 %
Lymphocytes Relative: 13 %
Lymphs Abs: 0.8 10*3/uL (ref 0.7–4.0)
MCH: 27.5 pg (ref 26.0–34.0)
MCHC: 30.3 g/dL (ref 30.0–36.0)
MCV: 90.9 fL (ref 80.0–100.0)
Monocytes Absolute: 0.6 10*3/uL (ref 0.1–1.0)
Monocytes Relative: 10 %
Neutro Abs: 4.9 10*3/uL (ref 1.7–7.7)
Neutrophils Relative %: 76 %
Platelets: 234 10*3/uL (ref 150–400)
RBC: 3.85 MIL/uL — ABNORMAL LOW (ref 4.22–5.81)
RDW: 14.6 % (ref 11.5–15.5)
WBC: 6.4 10*3/uL (ref 4.0–10.5)
nRBC: 0 % (ref 0.0–0.2)

## 2019-04-15 LAB — BASIC METABOLIC PANEL
Anion gap: 6 (ref 5–15)
BUN: 22 mg/dL (ref 8–23)
CO2: 30 mmol/L (ref 22–32)
Calcium: 8.9 mg/dL (ref 8.9–10.3)
Chloride: 101 mmol/L (ref 98–111)
Creatinine, Ser: 1.04 mg/dL (ref 0.61–1.24)
GFR calc Af Amer: >60 mL/min (ref >60)
GFR calc non Af Amer: >60 mL/min (ref >60)
Glucose, Bld: 108 mg/dL — ABNORMAL HIGH (ref 70–99)
Potassium: 3.9 mmol/L (ref 3.5–5.1)
Sodium: 137 mmol/L (ref 135–145)

## 2019-04-15 MED ORDER — PIPERACILLIN-TAZOBACTAM 3.375 G IVPB
3.3750 g | Freq: Three times a day (TID) | INTRAVENOUS | Status: DC
  Administered 2019-04-15 – 2019-04-17 (×5): 3.375 g via INTRAVENOUS
  Filled 2019-04-15 (×5): qty 50

## 2019-04-15 MED ORDER — ALBUTEROL SULFATE (2.5 MG/3ML) 0.083% IN NEBU: 2.5000 mg | INHALATION_SOLUTION | RESPIRATORY_TRACT | Status: DC | PRN

## 2019-04-15 MED ORDER — HEPARIN SODIUM (PORCINE) 5000 UNIT/ML IJ SOLN
5000.0000 [IU] | Freq: Three times a day (TID) | INTRAMUSCULAR | Status: DC
  Administered 2019-04-15 – 2019-04-17 (×5): 5000 [IU] via SUBCUTANEOUS
  Filled 2019-04-15 (×5): qty 1

## 2019-04-15 MED ORDER — ACETAMINOPHEN 650 MG RE SUPP: 650.0000 mg | Freq: Four times a day (QID) | RECTAL | Status: DC | PRN

## 2019-04-15 MED ORDER — ONDANSETRON HCL 4 MG PO TABS: 4.0000 mg | ORAL_TABLET | Freq: Four times a day (QID) | ORAL | Status: DC | PRN

## 2019-04-15 MED ORDER — ATORVASTATIN CALCIUM 40 MG PO TABS
80.0000 mg | ORAL_TABLET | Freq: Every day | ORAL | Status: DC
  Administered 2019-04-16: 17:00:00 80 mg via ORAL
  Filled 2019-04-15: qty 2

## 2019-04-15 MED ORDER — TRAZODONE HCL 50 MG PO TABS
50.0000 mg | ORAL_TABLET | Freq: Every evening | ORAL | Status: DC | PRN
  Filled 2019-04-15: qty 1

## 2019-04-15 MED ORDER — CLINDAMYCIN PHOSPHATE 600 MG/50ML IV SOLN
600.0000 mg | Freq: Once | INTRAVENOUS | Status: AC
  Administered 2019-04-15: 600 mg via INTRAVENOUS
  Filled 2019-04-15: qty 50

## 2019-04-15 MED ORDER — ASPIRIN EC 81 MG PO TBEC
81.0000 mg | DELAYED_RELEASE_TABLET | Freq: Every day | ORAL | Status: DC
  Administered 2019-04-16 – 2019-04-17 (×2): 81 mg via ORAL
  Filled 2019-04-15 (×2): qty 1

## 2019-04-15 MED ORDER — FUROSEMIDE 20 MG PO TABS
20.0000 mg | ORAL_TABLET | Freq: Every morning | ORAL | Status: DC
  Administered 2019-04-16 – 2019-04-17 (×2): 20 mg via ORAL
  Filled 2019-04-15 (×2): qty 1

## 2019-04-15 MED ORDER — PANTOPRAZOLE SODIUM 40 MG PO TBEC
40.0000 mg | DELAYED_RELEASE_TABLET | Freq: Every day | ORAL | Status: DC
  Administered 2019-04-16 – 2019-04-17 (×2): 40 mg via ORAL
  Filled 2019-04-15 (×2): qty 1

## 2019-04-15 MED ORDER — SODIUM CHLORIDE 0.9% FLUSH
3.0000 mL | INTRAVENOUS | Status: DC | PRN
  Administered 2019-04-15: 3 mL via INTRAVENOUS
  Filled 2019-04-15: qty 3

## 2019-04-15 MED ORDER — NIACIN 500 MG PO TABS
500.0000 mg | ORAL_TABLET | Freq: Every morning | ORAL | Status: DC
  Administered 2019-04-16 – 2019-04-17 (×2): 500 mg via ORAL
  Filled 2019-04-15 (×5): qty 1

## 2019-04-15 MED ORDER — MECLIZINE HCL 12.5 MG PO TABS: 12.5000 mg | ORAL_TABLET | Freq: Every day | ORAL | Status: DC | PRN

## 2019-04-15 MED ORDER — ENSURE ENLIVE PO LIQD
237.0000 mL | Freq: Two times a day (BID) | ORAL | Status: DC
  Administered 2019-04-16 – 2019-04-17 (×2): 237 mL via ORAL

## 2019-04-15 MED ORDER — ONDANSETRON HCL 4 MG/2ML IJ SOLN: 4.0000 mg | Freq: Four times a day (QID) | INTRAMUSCULAR | Status: DC | PRN

## 2019-04-15 MED ORDER — SODIUM CHLORIDE 0.9 % IV SOLN: 250.0000 mL | INTRAVENOUS | Status: DC | PRN

## 2019-04-15 MED ORDER — VANCOMYCIN HCL 1.5 G IV SOLR
1500.0000 mg | Freq: Once | INTRAVENOUS | Status: DC
  Filled 2019-04-15: qty 1500

## 2019-04-15 MED ORDER — SODIUM CHLORIDE 0.9% FLUSH
3.0000 mL | Freq: Two times a day (BID) | INTRAVENOUS | Status: DC
  Administered 2019-04-15 – 2019-04-17 (×4): 3 mL via INTRAVENOUS

## 2019-04-15 MED ORDER — ACETAMINOPHEN 325 MG PO TABS: 650.0000 mg | ORAL_TABLET | Freq: Four times a day (QID) | ORAL | Status: DC | PRN

## 2019-04-15 MED ORDER — LISINOPRIL 10 MG PO TABS
10.0000 mg | ORAL_TABLET | Freq: Every morning | ORAL | Status: DC
  Administered 2019-04-16 – 2019-04-17 (×2): 10 mg via ORAL
  Filled 2019-04-15 (×2): qty 1

## 2019-04-15 MED ORDER — POLYETHYLENE GLYCOL 3350 17 G PO PACK: 17.0000 g | PACK | Freq: Every day | ORAL | Status: DC | PRN

## 2019-04-15 NOTE — Progress Notes (Signed)
Patient is admitted to room 339 with the diagnosis of wound infection. A & O x 4. Denied any acute pain. The patient is oriented to his room,ascom/call bell and staff. Full assessment to epic completed. Patient and his daughter voice no concern. Will continue to monitor.

## 2019-04-15 NOTE — Progress Notes (Signed)
Pharmacy Antibiotic Note  Paul Snyder is a 83 y.o. male admitted on 04/15/2019 with wound infection.  Pharmacy has been consulted for zosyn dosing.  Plan: Zosyn 3.375g IV q8h (4 hour infusion).  F/u cx and clinical progress Monitor V/S, labs   Height: 6' (182.9 cm) Weight: 170 lb (77.1 kg) IBW/kg (Calculated) : 77.6  Temp (24hrs), Avg:97.4 F (36.3 C), Min:96.6 F (35.9 C), Max:98.1 F (36.7 C)  Recent Labs  Lab 04/15/19 1301  WBC 6.4  CREATININE 1.04    Estimated Creatinine Clearance: 60.7 mL/min (by C-G formula based on SCr of 1.04 mg/dL).    No Known Allergies  Antimicrobials this admission: Zosyn 11/23 >>  Vancomycin 1500mg  IV x 1 dose in ED, clindamycin 600mg  IV x 1 dose in ED 11/23  Microbiology results: 11/23 wound cx:    Thank you for allowing pharmacy to be a part of this patient's care.  Isac Sarna, BS [place], [place] Clinical Pharmacist Pager [phone] 04/15/2019 5:12 PM

## 2019-04-15 NOTE — H&P (Addendum)
Patient Demographics:    Paul Snyder, is a 83 y.o. male  MRN: [id number]   DOB - [birth year]  Admit Date - 04/15/2019  Outpatient Primary MD for the patient is Loman Brooklyn, FNP   Assessment & Plan:    Principal Problem:   Wound infection Active Problems:   Essential hypertension   CAD (coronary artery disease)-- STENTS IN 06/2017   COPD (chronic obstructive pulmonary disease) (HCC)   GERD (gastroesophageal reflux disease)   Tobacco abuse    1) right thigh wound infection-- 4x10 cm wound noted to the anterior aspect of the right thigh with necrotic scab --- with purulent drainage,  -Get general surgery consult defer to general surgery if CT scanner needed to evaluate for possible deep tissue infection/injury -Given that the initial injury was related to farm equipment we need to cover for gram negatives and anaerobes -Zosyn per pharmacy -Okay to give vancomycin x1 dose pending MRSA PCR -EDP obtained wound cultures, further wound cultures after debridement by general surgery -WBC 6.4, no fevers,  2) history of CAD status post angioplasty and stenting on 07/19/2017--continue aspirin and Lipitor, no beta-blockers due to bradycardia  3) right femur x-rays suggest-  distal right femoral shaft consistent with infarction or enchondroma.-Orthopedic consult requested   4) tobacco abuse and COPD--- no COPD flareup at this time, bronchodilators as needed as ordered  5)h/O gerd-stable, continue Protonix  6)HPEF--- last known EF 55 to 60%, patient has chronic diastolic dysfunction CHF (d2CHF)--no acute exacerbation at this time, continue Lasix 20 mg daily and lisinopril 10 mg daily  with History of - Reviewed by me  Past Medical History:  Diagnosis Date   CHF (congestive heart failure) (HCC)    COPD  (chronic obstructive pulmonary disease) ([place])    GERD (gastroesophageal reflux disease)    Lung cancer ([facility]) 2009   NSTEMI (non-ST elevated myocardial infarction) ([place]) 07/16/2017   2/19 PCI/DES x1 to Lcx with moderate (50%) in the RCA, mild (20%) in LAD      Past Surgical History:  Procedure Laterality Date   ANKLE FRACTURE SURGERY Right 1970s   CATARACT EXTRACTION Left    CORONARY ANGIOPLASTY WITH STENT PLACEMENT  07/19/2017   CORONARY STENT INTERVENTION N/A 07/19/2017   Procedure: CORONARY STENT INTERVENTION;  Surgeon: [name], MD;  Location: [facility];  Service: Cardiovascular;  Laterality: N/A;   LEFT HEART CATH AND CORONARY ANGIOGRAPHY N/A 07/19/2017   Procedure: LEFT HEART CATH AND CORONARY ANGIOGRAPHY;  Surgeon: [name], MD;  Location: [facility];  Service: Cardiovascular;  Laterality: N/A;   LUNG BIOPSY        Chief Complaint  Patient presents with   Wound Infection      HPI:    Paul Snyder  is a 83 y.o. male with past medical history relevant for d2CHF, COPD, GERD, lung cancer and CAD with prior stenting who presents for evaluation of wound to the  right thigh with initial date of injury 03/12/2019,  -Initial right thigh injury was truck tractor/farm equipment related ( tractor ran over his leg).   -PCP apparently treated him with Silvadene cream and Keflex -WBC on 03/12/2019 was actually 15k,  -Continues to have purulent drainage No fever  Or chills   No Nausea, Vomiting or Diarrhea  -No chest pains, no palpitations no dizziness no other complaints  -Patient went to follow-up with PCP on 04/15/2019 PCP concerned about the wound sent him to the ED for further evaluation and treatment  -EDP obtained wound cultures and requested hospitalization for further management after patient failed outpatient oral antibiotics  xrays of the rt femur with IMPRESSION: Stable curvilinear calcifications seen in distal right femoral shaft  consistent with infarction or enchondroma. No acute abnormality is noted  In Ed--BMP is unremarkable with a creatinine of 1.0 CBC with hemoglobin of 10.6 and WBC of 6.4   Review of systems:    In addition to the HPI above,   A full Review of  Systems was done, all other systems reviewed are negative except as noted above in HPI , .    Social History:  Reviewed by me    Social History   Tobacco Use   Smoking status: Former Smoker    Packs/day: 1.00    Years: 42.00    Pack years: 42.00    Types: Cigarettes    Quit date: 1996    Years since quitting: 24.9   Smokeless tobacco: Never Used  Substance Use Topics   Alcohol use: No    Frequency: Never     Family History :  Reviewed by me    Family History  Problem Relation Age of Onset   Healthy Mother    Breast cancer Sister    Heart disease Brother    Heart failure Brother    Breast cancer Sister    Hypertension Son    Hyperlipidemia Son     Home Medications:   Prior to Admission medications   Medication Sig Start Date End Date Taking? Authorizing Provider  albuterol (PROVENTIL HFA;VENTOLIN HFA) 108 (90 Base) MCG/ACT inhaler Inhale 2 puffs into the lungs every 4 (four) hours as needed for wheezing or shortness of breath.    Yes [provider]  aspirin EC 81 MG EC tablet Take 1 tablet (81 mg total) by mouth daily. 07/20/17  Yes Cheryln Manly, NP  atorvastatin (LIPITOR) 80 MG tablet TAKE 1 TABLET BY MOUTH DAILY AT 6 PM Patient taking differently: Take 80 mg by mouth every morning.  11/07/18  Yes Branch, Alphonse Guild, MD  cephALEXin (KEFLEX) 250 MG capsule Take 1 capsule (250 mg total) by mouth 3 (three) times daily. 04/03/19  Yes Terald Sleeper, PA-C  furosemide (LASIX) 20 MG tablet Take 20 mg by mouth every morning.    Yes [provider]  lisinopril (ZESTRIL) 10 MG tablet Take 10 mg by mouth every morning.  02/26/19  Yes [provider]  meclizine (ANTIVERT) 12.5 MG tablet Take  12.5 mg by mouth daily as needed for dizziness.  10/15/17  Yes [provider]  niacin 500 MG tablet Take 500 mg by mouth every morning.    Yes [provider]  pantoprazole (PROTONIX) 40 MG tablet Take 1 tablet (40 mg total) by mouth daily. Patient taking differently: Take 40 mg by mouth every morning.  03/19/19  Yes Hendricks Limes F, FNP  silver sulfADIAZINE (SILVADENE) 1 % cream Apply 1 application topically  daily. 04/03/19  Yes Terald Sleeper, PA-C     Allergies:    No Known Allergies   Physical Exam:   Vitals  Blood pressure 140/66, pulse (!) 58, temperature 98.1 F (36.7 C), temperature source Oral, resp. rate 16, height 6' (1.829 m), weight 77.1 kg, SpO2 96 %.  Physical Examination: General appearance - alert, well appearing, and in no distress Mental status - alert, oriented to person, place, and time,  Eyes - sclera anicteric Neck - supple, no JVD elevation , Chest - clear  to auscultation bilaterally, symmetrical air movement,  Heart - S1 and S2 normal, regular  Abdomen - soft, nontender, nondistended, no masses or organomegaly Neurological - screening mental status exam normal, neck supple without rigidity, cranial nerves II through XII intact, DTR's normal and symmetric Extremities - no pedal edema noted, intact peripheral pulses  MSK- Rt LE-4x10 cm wound noted to the anterior aspect of the right thigh with necrotic scab  proximal and.  The more distal aspect is open and has some scant bloody/purulent drainage.  There is some surrounding erythema and is induration that extends down the medial aspect of the thigh.  Scattered wounds noted to the distal right tib-fib with surrounding induration.  There is some active purulent drainage noted.  -Please see photos in epic -[place]    04/15/2019 10:39  Attached To:  Office Visit on 04/15/19 with Janora Norlander, DO  Source Information  Ronnie Doss M, DO    [place] Review:    CBC Recent Labs  Lab 04/15/19 1301  WBC 6.4  HGB 10.6*  HCT 35.0*  PLT 234  MCV 90.9  MCH 27.5  MCHC 30.3  RDW 14.6  LYMPHSABS 0.8  MONOABS 0.6  EOSABS 0.1  BASOSABS 0.0   ------------------------------------------------------------------------------------------------------------------  Chemistries  Recent Labs  Lab 04/15/19 1301  NA 137  K 3.9  CL 101  CO2 30  GLUCOSE 108*  BUN 22  CREATININE 1.04  CALCIUM 8.9   ------------------------------------------------------------------------------------------------------------------ estimated creatinine clearance is 60.7 mL/min (by C-G formula based on SCr of 1.04 mg/dL). ------------------------------------------------------------------------------------------------------------------ No results for input(s): TSH, T4TOTAL, T3FREE, THYROIDAB in the last 72 hours.  Invalid input(s): FREET3   Coagulation profile No results for input(s): INR, PROTIME in the last 168 hours. ------------------------------------------------------------------------------------------------------------------- No results for input(s): DDIMER in the last 72 hours. -------------------------------------------------------------------------------------------------------------------  Cardiac Enzymes No results for input(s): CKMB, TROPONINI, MYOGLOBIN in the last 168 hours.  Invalid input(s): CK ------------------------------------------------------------------------------------------------------------------ No results found for: BNP   ---------------------------------------------------------------------------------------------------------------  Urinalysis No results found for: COLORURINE, APPEARANCEUR, [place], [place], [place], [place], [place], [place], PROTEINUR, UROBILINOGEN, NITRITE,  LEUKOCYTESUR  ----------------------------------------------------------------------------------------------------------------   Imaging Results:    Dg Femur Min 2 Views Right  Result Date: 04/15/2019 CLINICAL DATA:  Nonhealing wound of right leg. EXAM: RIGHT FEMUR 2 VIEWS COMPARISON:  March 12, 2019. FINDINGS: There is no evidence of fracture. Vascular calcifications are noted. Stable curvilinear calcifications are seen in the distal right femoral shaft consistent with infarction or enchondroma. IMPRESSION: Stable curvilinear calcifications seen in distal right femoral shaft consistent with infarction or enchondroma. No acute abnormality is noted. Electronically Signed   By: Marijo Conception M.D.   On: 04/15/2019 13:43    Radiological Exams on Admission: Dg Femur Min 2 Views Right  Result Date: 04/15/2019 CLINICAL DATA:  Nonhealing wound of right leg. EXAM: RIGHT FEMUR 2 VIEWS COMPARISON:  March 12, 2019. FINDINGS: There is no evidence of fracture. Vascular calcifications are noted. Stable curvilinear calcifications are seen in the distal right femoral shaft consistent with infarction or enchondroma. IMPRESSION: Stable curvilinear calcifications seen in distal right femoral shaft consistent with infarction or enchondroma. No acute abnormality is noted. Electronically Signed   By: Marijo Conception M.D.   On: 04/15/2019 13:43    DVT Prophylaxis -SCD/heparin AM Labs Ordered, also please review Full Orders  Family Communication: Admission, patients condition and plan of care including tests being ordered have been discussed with the patient  who indicate understanding and agree with the plan   Code Status - Full Code  Likely DC to  Home   Condition   stable  Roxan Hockey M.D on 04/15/2019 at 6:54 PM Go to [email or web address] -  for contact info  [facility] - Office  [phone]

## 2019-04-15 NOTE — Progress Notes (Signed)
Subjective: CC: Wound infection PCP: Loman Brooklyn, FNP Paul Snyder is a 83 y.o. male presenting to clinic today for:  1.  Wound infection Patient sustained an injury to the right leg after being cut by a tractor.  He notes he had gotten pinned under his tractor.  He was evaluated on 04/03/2019 via telephone visit for presumed cellulitis.  He was treated with Keflex and Silvadene cream and instructed to follow-up if symptoms do not improve.  Per his report he was actually treated with antibiotics prior to that telephone visit as well.  He reports ongoing swelling, redness, warmth and breakdown of the skin of the wound.  He has been dressing it religiously with the Silvadene cream that was prescribed.   ROS: Per HPI  Allergies  Allergen Reactions  . No Allergies On File    Past Medical History:  Diagnosis Date  . CHF (congestive heart failure) ([place])   . COPD (chronic obstructive pulmonary disease) ([place])   . GERD (gastroesophageal reflux disease)   . Lung cancer ([place]) 2009  . NSTEMI (non-ST elevated myocardial infarction) ([facility]) 07/16/2017   2/19 PCI/DES x1 to Lcx with moderate (50%) in the RCA, mild (20%) in LAD    Current Outpatient Medications:  .  albuterol (PROVENTIL HFA;VENTOLIN HFA) 108 (90 Base) MCG/ACT inhaler, Inhale 2 puffs into the lungs every 4 (four) hours as needed for wheezing or shortness of breath. , Disp: , Rfl:  .  aspirin EC 81 MG EC tablet, Take 1 tablet (81 mg total) by mouth daily., Disp: , Rfl:  .  atorvastatin (LIPITOR) 80 MG tablet, TAKE 1 TABLET BY MOUTH DAILY AT 6 PM, Disp: 90 tablet, Rfl: 1 .  cephALEXin (KEFLEX) 250 MG capsule, Take 1 capsule (250 mg total) by mouth 3 (three) times daily., Disp: 30 capsule, Rfl: 1 .  furosemide (LASIX) 20 MG tablet, Take 20 mg by mouth daily., Disp: , Rfl:  .  lisinopril (ZESTRIL) 10 MG tablet, Take 10 mg by mouth daily., Disp: , Rfl:  .  meclizine (ANTIVERT) 12.5 MG tablet, Take 12.5 mg by mouth daily as  needed for dizziness. , Disp: , Rfl: 1 .  niacin 500 MG tablet, Take 500 mg by mouth daily. , Disp: , Rfl:  .  pantoprazole (PROTONIX) 40 MG tablet, Take 1 tablet (40 mg total) by mouth daily., Disp: 90 tablet, Rfl: 1 .  silver sulfADIAZINE (SILVADENE) 1 % cream, Apply 1 application topically daily., Disp: 50 g, Rfl: 0 Social History   Socioeconomic History  . Marital status: Widowed    Spouse name: Not on file  . Number of children: Not on file  . Years of education: Not on file  . Highest education level: Not on file  Occupational History  . Not on file  Social Needs  . Financial resource strain: Not on file  . Food insecurity    Worry: Not on file    Inability: Not on file  . Transportation needs    Medical: Not on file    Non-medical: Not on file  Tobacco Use  . Smoking status: Former Smoker    Packs/day: 1.00    Years: 42.00    Pack years: 42.00    Types: Cigarettes    Quit date: 1996    Years since quitting: 24.9  . Smokeless tobacco: Never Used  Substance and Sexual Activity  . Alcohol use: No    Frequency: Never  . Drug use: Not Currently  . Sexual  activity: Never  Lifestyle  . Physical activity    Days per week: Not on file    Minutes per session: Not on file  . Stress: Not on file  Relationships  . Social Herbalist on phone: Not on file    Gets together: Not on file    Attends religious service: Not on file    Active member of club or organization: Not on file    Attends meetings of clubs or organizations: Not on file    Relationship status: Not on file  . Intimate partner violence    Fear of current or ex partner: Not on file    Emotionally abused: Not on file    Physically abused: Not on file    Forced sexual activity: Not on file  Other Topics Concern  . Not on file  Social History Narrative  . Not on file   Family History  Problem Relation Age of Onset  . Healthy Mother   . Breast cancer Sister   . Heart disease Brother   .  Heart failure Brother   . Breast cancer Sister   . Hypertension Son   . Hyperlipidemia Son     Objective: Office vital signs reviewed. BP (!) 104/58   Pulse 65   Temp (!) 96.6 F (35.9 C) (Temporal)   Physical Examination:  General: Awake, alert, nontoxic, No acute distress MSK: Ambulates independently Skin: Large, irregularly shaped wound on the right medial anterior thigh.  There appears to be muscle involvement.  There is scant purulent drainage.  There is induration involving the medial posterior aspect of the thigh as well.       Assessment/ Plan: 83 y.o. male   1. Infected wound The wound is very concerning for deep seeded infection.  He has quite a bit of skin breakdown, induration along the medial posterior aspect of the thigh that is concerning for tunneling.  I recommended that he seek immediate medical attention emergency department for imaging and likely admission for IV antibiotics and surgical debridement of the wound.  He was good understanding and will have his daughter bring him immediately.  Signout given to [facility] emergency department triage nurse.   No orders of the defined types were placed in this encounter.  No orders of the defined types were placed in this encounter.    Janora Norlander, DO [place] [phone]
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
## 2019-04-15 NOTE — Patient Instructions (Signed)
This wound is quite extensive and I worried that is involving muscle.  I am recommending to go immediately to the emergency department to have imaging and likely IV antibiotics.  They may need to admit you to have the wound surgically cleaned out.  I will call down to [facility] emergency department and let them know that you are on your way.

## 2019-04-16 DIAGNOSIS — L089 Local infection of the skin and subcutaneous tissue, unspecified: Secondary | ICD-10-CM

## 2019-04-16 DIAGNOSIS — S81801A Unspecified open wound, right lower leg, initial encounter: Secondary | ICD-10-CM

## 2019-04-16 LAB — CBC
HCT: 33.4 % — ABNORMAL LOW (ref 39.0–52.0)
Hemoglobin: 10.2 g/dL — ABNORMAL LOW (ref 13.0–17.0)
MCH: 27.8 pg (ref 26.0–34.0)
MCHC: 30.5 g/dL (ref 30.0–36.0)
MCV: 91 fL (ref 80.0–100.0)
Platelets: 204 10*3/uL (ref 150–400)
RBC: 3.67 MIL/uL — ABNORMAL LOW (ref 4.22–5.81)
RDW: 14.5 % (ref 11.5–15.5)
WBC: 4.6 10*3/uL (ref 4.0–10.5)
nRBC: 0 % (ref 0.0–0.2)

## 2019-04-16 LAB — BASIC METABOLIC PANEL
Anion gap: 7 (ref 5–15)
BUN: 16 mg/dL (ref 8–23)
CO2: 29 mmol/L (ref 22–32)
Calcium: 8.7 mg/dL — ABNORMAL LOW (ref 8.9–10.3)
Chloride: 102 mmol/L (ref 98–111)
Creatinine, Ser: 0.87 mg/dL (ref 0.61–1.24)
GFR calc Af Amer: >60 mL/min (ref >60)
GFR calc non Af Amer: >60 mL/min (ref >60)
Glucose, Bld: 93 mg/dL (ref 70–99)
Potassium: 4 mmol/L (ref 3.5–5.1)
Sodium: 138 mmol/L (ref 135–145)

## 2019-04-16 LAB — SARS CORONAVIRUS 2 (TAT 6-24 HRS): SARS Coronavirus 2: NEGATIVE

## 2019-04-16 LAB — MRSA PCR SCREENING: MRSA by PCR: NEGATIVE

## 2019-04-16 MED ORDER — DAKINS (1/4 STRENGTH) 0.125 % EX SOLN
Freq: Two times a day (BID) | CUTANEOUS | Status: DC
  Filled 2019-04-16: qty 473

## 2019-04-16 MED ORDER — JUVEN PO PACK
1.0000 | PACK | Freq: Two times a day (BID) | ORAL | Status: DC
  Administered 2019-04-17: 08:00:00 1 via ORAL
  Filled 2019-04-16: qty 1

## 2019-04-16 MED ORDER — ENSURE MAX PROTEIN PO LIQD
11.0000 [oz_av] | Freq: Every day | ORAL | Status: DC
  Administered 2019-04-16 – 2019-04-17 (×2): 11 [oz_av] via ORAL

## 2019-04-16 MED ORDER — OCUVITE-LUTEIN PO CAPS
1.0000 | ORAL_CAPSULE | Freq: Every day | ORAL | Status: DC
  Administered 2019-04-16 – 2019-04-17 (×2): 1 via ORAL
  Filled 2019-04-16 (×2): qty 1

## 2019-04-16 NOTE — Progress Notes (Signed)
Patient Demographics:    Paul Snyder, is a 83 y.o. male, DOB - 1936/01/13, VEH:[id number]  Admit date - 04/15/2019   Admitting Physician Havah Ammon Denton Brick, MD  Outpatient Primary MD for the patient is Loman Brooklyn, FNP  LOS - 1   Chief Complaint  Patient presents with  . Wound Infection        Subjective:    Paul Snyder today has no fevers, no emesis,  No chest pain, no new complaints -Some discomfort over right thigh after debridement  Assessment  & Plan :    Principal Problem:   Wound infection Active Problems:   Essential hypertension   CAD (coronary artery disease)-- STENTS IN 06/2017   COPD (chronic obstructive pulmonary disease) (HCC)   GERD (gastroesophageal reflux disease)   Tobacco abuse   Traumatic open wound of right lower leg with infection   1)Right thigh wound infection--  -status post debridement of necrotic areas and removal of old hematoma -Get general surgery consult appreciated -Given that the initial injury was related to farm equipment we need to cover for gram negatives and anaerobes -Zosyn per pharmacy -Okay to STOP Vancomycin as  MRSA PCR is negative -EDP obtained wound cultures, further wound cultures obtained after debridement by general surgery -WBC down to 4.6 from 6.4, no fevers,  2)History of CAD status post angioplasty and stenting on 07/19/2017--continue aspirin and Lipitor, no beta-blockers due to bradycardia  3) right femur x-rays suggest-  distal right femoral shaft consistent with infarction or enchondroma.-Orthopedic consult requested   4) tobacco abuse and COPD--- no COPD flareup at this time, bronchodilators as needed as ordered  5)H/o GERD-stable, continue Protonix  6)HPEF--- last known EF 55 to 60%, patient has chronic diastolic dysfunction CHF (d2CHF)--no acute exacerbation at this time, continue Lasix 20 mg daily and lisinopril 10 mg  daily  7) 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 aspirin EC  81 mg Oral Daily  . atorvastatin  80 mg Oral q1800  . feeding supplement (ENSURE ENLIVE)  237 mL Oral BID BM  . furosemide  20 mg Oral q morning - 10a  . heparin  5,000 Units Subcutaneous Q8H  . lisinopril  10 mg Oral q morning - 10a  . multivitamin-lutein  1 capsule Oral Daily  . niacin  500 mg Oral q morning - 10a  . [START ON 04/17/2019] nutrition supplement (JUVEN)  1 packet Oral BID BM  . pantoprazole  40 mg Oral Daily  . Ensure Max Protein  11 oz Oral Daily  . sodium chloride flush  3 mL Intravenous Q12H   Continuous Infusions: . sodium chloride    . piperacillin-tazobactam (ZOSYN)  IV 3.375 g (04/16/19 1332)  .  vancomycin     PRN Meds:.sodium chloride, acetaminophen **OR** acetaminophen, albuterol, meclizine, ondansetron **OR** ondansetron (ZOFRAN) IV, polyethylene glycol, sodium chloride flush, traZODone    Anti-infectives (From admission, onward)   Start     Dose/Rate Route Frequency Ordered Stop   04/15/19 2000  piperacillin-tazobactam (ZOSYN) IVPB 3.375 g     3.375 g 12.5 mL/hr over 240 Minutes Intravenous Every 8 hours 04/15/19 1715     04/15/19 1715  vancomycin (VANCOCIN) 1,500 mg in sodium chloride 0.9 % 500 mL IVPB     1,500 mg 250 mL/hr over 120 Minutes Intravenous  Once 04/15/19 1711     04/15/19 1400  clindamycin (CLEOCIN) IVPB 600 mg     600 mg 100 mL/hr over 30 Minutes  Intravenous  Once 04/15/19 1350 04/15/19 1608        Objective:   Vitals:   04/15/19 1239 04/15/19 1953 04/16/19 0423 04/16/19 1616  BP:  (!) 150/58 127/77 107/60  Pulse:  (!) 57 (!) 56 (!) 57  Resp:  20 16 18   Temp:  98.6 F (37 C) 98.1 F (36.7 C) 97.6 F (36.4 C)  TempSrc:  Oral Oral Oral  SpO2:  98% 92% 98%  Weight: 77.1 kg     Height: 6' (1.829 m)       Wt Readings from Last 3 Encounters:  04/15/19 77.1 kg  01/23/19 77.1 kg  01/03/19 76.7 kg    Intake/Output Summary (Last 24 hours) at 04/16/2019 1900 Last data filed at 04/16/2019 1754 Gross per 24 hour  Intake 280 ml  Output 600 ml  Net -320 ml   Physical Exam  Gen:- Awake Alert,  In no apparent distress  HEENT:- [place].AT, No sclera icterus Neck-Supple Neck,No JVD,.  Lungs-  CTAB , fair symmetrical air movement CV- S1, S2 normal, regular  Abd-  +ve B.Sounds, Abd Soft, No tenderness,    Extremity/Skin:- No  edema, pedal pulses present  Psych-affect is appropriate, oriented x3 Neuro-no new focal deficits, no tremors MSK--right thigh anterior wound status post debridement with packing   Data Review:   Micro Results Recent Results (from the past 240 hour(s))  Wound or Superficial Culture     Status: None (Preliminary result)   Collection Time: 04/15/19  1:11 PM   Specimen: Wound  Result Value Ref Range Status   Specimen Description   Final    WOUND Performed at [facility], [address], [place], [place] [address]    Special Requests   Final    Normal Performed at [facility], [address], [place], [place] [address]    Gram Stain   Final    RARE WBC PRESENT, PREDOMINANTLY PMN NO ORGANISMS SEEN    Culture   Final    CULTURE REINCUBATED FOR BETTER GROWTH Performed at [facility] [address] [place], [place] [address]    Report Status PENDING  Incomplete  SARS CORONAVIRUS 2 (TAT 6-24 HRS) Nasopharyngeal Nasopharyngeal Swab     Status: None   Collection Time: 04/15/19  4:12 PM    Specimen: Nasopharyngeal Swab  Result Value Ref Range Status   SARS Coronavirus 2 NEGATIVE NEGATIVE Final    Comment: (NOTE) SARS-CoV-2 target nucleic acids are NOT DETECTED. The SARS-CoV-2 RNA is generally detectable in upper and lower respiratory specimens during the acute phase of infection. Negative results do not preclude SARS-CoV-2 infection, do not rule out co-infections with other pathogens, and should not be used as the sole basis for treatment or other patient  management decisions. Negative results must be combined with clinical observations, patient history, and epidemiological information. The expected result is Negative. Fact Sheet for Patients: SugarRoll.be Fact Sheet for Healthcare Providers: https://www.woods-mathews.com/ This test is not yet approved or cleared by the [place] FDA and  has been authorized for detection and/or diagnosis of SARS-CoV-2 by FDA under an Emergency Use Authorization (EUA). This EUA will remain  in effect (meaning this test can be used) for the duration of the COVID-19 declaration under Section 56 4(b)(1) of the Act, 21 U.S.C. section 360bbb-3(b)(1), unless the authorization is terminated or revoked sooner. Performed at [facility], [place] [address] [place], [place] [address]   MRSA PCR Screening     Status: None   Collection Time: 04/15/19 11:03 PM   Specimen: Nasal Mucosa; Nasopharyngeal  Result Value Ref Range Status   MRSA by PCR NEGATIVE NEGATIVE Final    Comment:        The GeneXpert MRSA Assay (FDA approved for NASAL specimens only), is one component of a comprehensive MRSA colonization surveillance program. It is not intended to diagnose MRSA infection nor to guide or monitor treatment for MRSA infections. Performed at [facility], [address] [place], [place] [address]     Radiology Reports Dg Femur Min 2 Views Right  Result Date: 04/15/2019 CLINICAL DATA:   Nonhealing wound of right leg. EXAM: RIGHT FEMUR 2 VIEWS COMPARISON:  March 12, 2019. FINDINGS: There is no evidence of fracture. Vascular calcifications are noted. Stable curvilinear calcifications are seen in the distal right femoral shaft consistent with infarction or enchondroma. IMPRESSION: Stable curvilinear calcifications seen in distal right femoral shaft consistent with infarction or enchondroma. No acute abnormality is noted. Electronically Signed   By: Marijo Conception M.D.   On: 04/15/2019 13:43     CBC Recent Labs  Lab 04/15/19 1301 04/16/19 0442  WBC 6.4 4.6  HGB 10.6* 10.2*  HCT 35.0* 33.4*  PLT 234 204  MCV 90.9 91.0  MCH 27.5 27.8  MCHC 30.3 30.5  RDW 14.6 14.5  LYMPHSABS 0.8  --   MONOABS 0.6  --   EOSABS 0.1  --   BASOSABS 0.0  --     Chemistries  Recent Labs  Lab 04/15/19 1301 04/16/19 0442  NA 137 138  K 3.9 4.0  CL 101 102  CO2 30 29  GLUCOSE 108* 93  BUN 22 16  CREATININE 1.04 0.87  CALCIUM 8.9 8.7*   ------------------------------------------------------------------------------------------------------------------ No results for input(s): CHOL, HDL, LDLCALC, TRIG, CHOLHDL, LDLDIRECT in the last 72 hours.  Lab Results  Component Value Date   HGBA1C 5.8 (H) 07/20/2017   ------------------------------------------------------------------------------------------------------------------ No results for input(s): TSH, T4TOTAL, T3FREE, THYROIDAB in the last 72 hours.  Invalid input(s): FREET3 ------------------------------------------------------------------------------------------------------------------ No results for input(s): VITAMINB12, FOLATE, FERRITIN, TIBC, IRON, RETICCTPCT in the last 72 hours.  Coagulation profile No results for input(s): INR, PROTIME in the last 168 hours.  No results for input(s): DDIMER in the last 72 hours.  Cardiac Enzymes No results for input(s): CKMB, TROPONINI, MYOGLOBIN in the last 168 hours.  Invalid  input(s): CK ------------------------------------------------------------------------------------------------------------------ No results found for: BNP   Roxan Hockey M.D on 04/16/2019 at 7:00 PM  Go to [email or web address] - for contact info  [facility] - Office  [phone]
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
## 2019-04-16 NOTE — Progress Notes (Signed)
Demonstrated wet to dry dressing change to right thigh to patient's daughter who will be providing dressing changes to patient at home upon discharge. Pt's daughter verbalized understanding and teach back method was used to ensure understanding.

## 2019-04-16 NOTE — Progress Notes (Signed)
Patient ID: Paul Snyder, male   DOB: 03/25/1936, 83 y.o.   MRN: [id number]  Hospitalist has asked me to look at a x-ray of the femur.  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  I will not put in a formal consult.

## 2019-04-16 NOTE — TOC Initial Note (Signed)
Transition of Care [facility]) - Initial/Assessment Note    Patient Details  Name: Paul Snyder MRN: [id number] Date of Birth: January 30, 1936  [facility]) CM/SW Contact:    Boneta Lucks, RN Phone Number: 04/16/2019, 2:01 PM  Clinical Narrative:    Patient admitted with wound infection. Discharge planning patient will need twice a day wound care.  Daughter has been at the beside getting training.  CM set up Elias-Fela Solis for RN. Romualdo Bolk visited patient and daughter at the bedside.  Need orders at Discharge. TOC to follow.                Expected Discharge Plan: [place] Barriers to Discharge: Continued Medical Work up   Patient Goals and CMS Choice Patient states their goals for this hospitalization and ongoing recovery are:: to go home. CMS Medicare.gov Compare Post Acute Care list provided to:: Patient Represenative (must comment) Choice offered to / list presented to : Adult Children  Expected Discharge Plan and Services Expected Discharge Plan: [place]    Living arrangements for the past 2 months: [place]: RN [place] Agency: [place] ([place]) Date [place]: 04/16/19 Time [place]: 1400 Representative spoke with at [place]: [place] Arrangements/Services Living arrangements for the past 2 months: [place] Lives with:: Spouse          Need for Family Participation in Patient Care: Yes (Comment) Care giver support system in place?: Yes (comment)   Criminal Activity/Legal Involvement Pertinent to Current Situation/Hospitalization: No - Comment as needed  Activities of Daily Living Home Assistive Devices/Equipment: Cane (specify quad or straight) ADL Screening (condition at time of admission) Patient's cognitive ability adequate to safely complete daily activities?: Yes Is the patient deaf or have difficulty hearing?:  No Does the patient have difficulty seeing, even when wearing glasses/contacts?: No Does the patient have difficulty concentrating, remembering, or making decisions?: No Patient able to express need for assistance with ADLs?: Yes Does the patient have difficulty dressing or bathing?: No Independently performs ADLs?: Yes (appropriate for developmental age) Does the patient have difficulty walking or climbing stairs?: No Weakness of Legs: Both Weakness of Arms/Hands: Both  Admission diagnosis:  Wound infection [T14.8XXA, L08.9] Patient Active Problem List   Diagnosis Date Noted  . Traumatic open wound of right lower leg with infection   . Wound infection 04/15/2019  . CAD (coronary artery disease)-- [place] IN 06/2017 04/15/2019  . Tobacco abuse 04/15/2019  . COPD (chronic obstructive pulmonary disease) ([place]) 09/07/2017  . GERD (gastroesophageal reflux disease) 09/07/2017  . Essential hypertension 07/20/2017  . History of heart attack 07/19/2017   PCP:  Loman [place], FNP Pharmacy:   [facility] [address], [place] [address] [address] [place] [address] Phone: [phone] Fax: [phone]   Readmission Risk Interventions No flowsheet data found.
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
## 2019-04-16 NOTE — Plan of Care (Signed)
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
## 2019-04-17 MED ORDER — OCUVITE-LUTEIN PO CAPS: 1.0000 | ORAL_CAPSULE | Freq: Every day | ORAL | 1 refills | Status: DC

## 2019-04-17 MED ORDER — ATORVASTATIN CALCIUM 80 MG PO TABS: 80.0000 mg | ORAL_TABLET | Freq: Every evening | ORAL | 1 refills | Status: DC

## 2019-04-17 MED ORDER — ALBUTEROL SULFATE HFA 108 (90 BASE) MCG/ACT IN AERS: 2.0000 | INHALATION_SPRAY | RESPIRATORY_TRACT | 1 refills | Status: DC | PRN

## 2019-04-17 MED ORDER — AMOXICILLIN-POT CLAVULANATE 875-125 MG PO TABS: 1.0000 | ORAL_TABLET | Freq: Two times a day (BID) | ORAL | 0 refills | Status: AC

## 2019-04-17 MED ORDER — ACETAMINOPHEN 325 MG PO TABS: 650.0000 mg | ORAL_TABLET | Freq: Four times a day (QID) | ORAL | 0 refills | Status: AC | PRN

## 2019-04-17 MED ORDER — ASPIRIN 81 MG PO TBEC: 81.0000 mg | DELAYED_RELEASE_TABLET | Freq: Every day | ORAL | 12 refills | Status: AC

## 2019-04-17 NOTE — Discharge Instructions (Signed)
1) right anterior thigh wound care ---pack right anterior thigh wound with saline dampened kerlix twice a day. Can shower before dressing change and leave bandage in place. After shower remove dressing and place new dressing.  Do not leave saturated dressing on for more than 10 minutes.  DO NOT SUBMERGE.  2)Needs to follow with the physical therapist at the [facility] on [address] for the first few weeks, and they can let me know if they are having issues or concerns.   3) follow-up with general surgeon Dr. Constance Haw as outpatient in 4-6 weeks for a wound check.   -Take medications including antibiotics as prescribed

## 2019-04-17 NOTE — Progress Notes (Addendum)
Patient is alert and oriented. He has taken all of his morning medications, no complaints of pain. Will continue to monitor.

## 2019-04-17 NOTE — Progress Notes (Signed)
Pt and daughter reviewed discharge instructions and prescriptions with RN. Verbal understanding of f/u and instructions obtained. IVs removed per policy, bleeding controlled with pressure dressing. Pt discharged from unit via wheelchair and in good/stable condition. No distress noted upon discharge.

## 2019-04-17 NOTE — Progress Notes (Signed)
Dr. Constance Haw changed dressing on wound, patient tolerated well without pain. Repacked will change again this afternoon. Will continue to monitor pt.

## 2019-04-17 NOTE — Progress Notes (Addendum)
[facility]  Doing very well. Packing changed and wound looking healthier. Less bleeding.   Pack with saline dampened kerlix twice a day. Can shower before dressing change and leave bandage in place. After shower remove dressing and place new dressing.  Do not leave saturated dressing on for more than 10 minutes.  DO NOT SUBMERGE. Needs to follow with the physical therapist at the [facility] on [address] for the first few weeks, and they can let me know if they are having issues or concerns.  I will see him in 4-6 weeks for a wound check.  Would give 7-10 days of antibiotics given the likely infected hematoma and prolonged open wound.   Updated Dr. Joesph Fillers.   Today:

## 2019-04-17 NOTE — Discharge Summary (Signed)
Paul Snyder, is a 83 y.o. male  DOB [birth year]  MRN [id number].  Admission date:  04/15/2019  Admitting Physician  Paul Hockey, MD  Discharge Date:  04/17/2019   Primary MD  Paul Brooklyn, FNP  Recommendations for primary care physician for things to follow:   1)Right anterior thigh wound care ---pack right anterior thigh wound with saline dampened kerlix twice a day. Can shower before dressing change and leave bandage in place. After shower remove dressing and place new dressing.  Do not leave saturated dressing on for more than 10 minutes.  DO NOT SUBMERGE.  2)Needs to follow with the  physical therapist at the [facility] on [address] for the wound care 2 times a week x4 weeks and they can let me know if they are having issues or concerns.   3) follow-up with general surgeon Paul Snyder as outpatient in 4-6 weeks for a wound check.   -Take medications including antibiotics as prescribed  Admission Diagnosis  Wound infection [T14.8XXA, L08.9]   Discharge Diagnosis  Wound infection [T14.8XXA, L08.9]   Principal Problem:   Wound infection Active Problems:   Essential hypertension   CAD (coronary artery disease)-- STENTS IN 06/2017   COPD (chronic obstructive pulmonary disease) (HCC)   GERD (gastroesophageal reflux disease)   Tobacco abuse   Traumatic open wound of right lower leg with infection      Past Medical History:  Diagnosis Date  . CHF (congestive heart failure) ([place])   . COPD (chronic obstructive pulmonary disease) ([place])   . GERD (gastroesophageal reflux disease)   . Lung cancer ([place]) 2009  . NSTEMI (non-ST elevated myocardial infarction) ([place]) 07/16/2017   2/19 PCI/DES x1 to Lcx with moderate (50%) in the RCA, mild (20%) in LAD    Past Surgical History:  Procedure Laterality Date  . ANKLE FRACTURE SURGERY Right 1970s  . CATARACT EXTRACTION Left   . CORONARY  ANGIOPLASTY WITH STENT PLACEMENT  07/19/2017  . CORONARY STENT INTERVENTION N/A 07/19/2017   Procedure: CORONARY STENT INTERVENTION;  Surgeon: Paul Sine, MD;  Location: [facility];  Service: Cardiovascular;  Laterality: N/A;  . LEFT HEART CATH AND CORONARY ANGIOGRAPHY N/A 07/19/2017   Procedure: LEFT HEART CATH AND CORONARY ANGIOGRAPHY;  Surgeon: Paul Sine, MD;  Location: [facility];  Service: Cardiovascular;  Laterality: N/A;  . LUNG BIOPSY         HPI  from the history and physical done on the day of admission:    Paul Snyder  is a 83 y.o. male with past medical history relevant ford2CHF, COPD, GERD, lung cancer and CAD with prior stenting who presents for evaluation of wound to the right thigh with initial date of injury 03/12/2019,  -Initial right thigh injury was truck tractor/farm equipment related ( tractor ran over his leg).  -PCP apparently treated him with Silvadene cream and Keflex -WBC on 03/12/2019 was actually 15k,  -Continues to have purulent drainage No fever  Or chills   No  Nausea, Vomiting or Diarrhea  -No chest pains, no palpitations no dizziness no other complaints  -Patient went to follow-up with PCP on 04/15/2019 PCP concerned about the wound sent him to the ED for further evaluation and treatment  -EDP obtained wound cultures and requested hospitalization for further management after patient failed outpatient oral antibiotics  xrays of the rt femur with IMPRESSION: Stable curvilinear calcifications seen in distal right femoral shaft consistent with infarction or enchondroma. No acute abnormality is noted  In Ed--BMP is unremarkable with a creatinine of 1.0 CBC with hemoglobin of 10.6 and WBC of 6.4     Hospital Course:   - 1)Right thigh wound infection-- -status post debridement of necrotic areas and removal of old hematoma -general surgery consult appreciated -Given that the initial injury was relatedto farm  equipment we need to cover for gram negatives and anaerobes -Treated with Zosyn, discharged on Augmentin -Okay to STOP Vancomycin as  MRSA PCR is negative -EDP obtained wound cultures, further wound cultures obtained after debridement by general surgery -WBC down to 4.6 from 6.4, no fevers, -Preliminary wound culture with staph epi  2)History of CAD status post angioplasty and stenting on 07/19/2017--continue aspirin and Lipitor,no beta-blockers due to bradycardia  3) HPEF---last known EF 55 to 60%, patient has chronic diastolic dysfunction ZOX(W9UEA)--VW acute exacerbation at this time, continue Lasix 20 mg daily and lisinopril 10 mg daily  4)tobacco abuse and COPD---no COPD flareup at this time, bronchodilators as needed as ordered  5)H/o GERD-stable, continue Protonix  6) 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   Specialty: Home Health Services Why:  RN to come to the home for wound care           Diet and Activity recommendation:  As advised  Discharge Instructions    Discharge Instructions    AMB referral to rehabilitation   Complete by: As directed    physical therapist at the [facility] on [address] for the wound care 2 times a week x4 weeks   Ambulatory referral to Physical Therapy   Complete by: As directed    physical therapist at the [facility] on [address] for the wound care 2 times a week x4 weeks   Iontophoresis - 4 mg/ml of dexamethasone: No    T.E.N.S. Unit Evaluation and Dispense as Indicated: No   Call MD for:  difficulty breathing, headache or visual disturbances   Complete by: As directed    Call MD for:  persistant dizziness or light-headedness   Complete by: As directed    Call MD for:  persistant nausea and vomiting   Complete by: As directed    Call MD for:  severe uncontrolled pain   Complete by: As directed    Call MD for:  temperature >100.4   Complete by: As directed    Diet - low sodium heart healthy   Complete by: As directed    Discharge instructions   Complete by: As directed    1) right anterior thigh wound care ---pack right anterior thigh  wound with saline dampened kerlix twice a day. Can shower before dressing change and leave bandage in place. After shower remove dressing and place new dressing.  Do not leave saturated dressing on for more than 10 minutes.  DO NOT SUBMERGE.  2)Needs to follow with the physical therapist at the [facility] on [address] for the first few weeks, and they can let me know if they are having issues or concerns.   3) follow-up with general surgeon Paul Snyder as outpatient in 4-6 weeks for a wound check.   -Take medications including antibiotics as prescribed   Increase activity slowly   Complete by: As directed         Discharge Medications     Allergies as of 04/17/2019   No Known Allergies     Medication List    STOP taking these medications   cephALEXin 250 MG capsule Commonly known as: Keflex   silver sulfADIAZINE 1 % cream Commonly known as: Silvadene     TAKE these medications   acetaminophen 325 MG tablet Commonly known as: TYLENOL Take 2 tablets (650 mg total) by mouth every 6 (six) hours as needed for mild pain (or Fever >/= 101).   albuterol 108 (90 Base) MCG/ACT inhaler Commonly known as: VENTOLIN HFA Inhale 2 puffs into the lungs every 4 (four) hours as needed for wheezing or shortness of breath.   amoxicillin-clavulanate 875-125 MG  tablet Commonly known as: Augmentin Take 1 tablet by mouth 2 (two) times daily for 7 days.   aspirin 81 MG EC tablet Take 1 tablet (81 mg total) by mouth daily with breakfast. What changed: when to take this   atorvastatin 80 MG tablet Commonly known as: LIPITOR Take 1 tablet (80 mg total) by mouth every evening. What changed: See the new instructions.   furosemide 20 MG tablet Commonly known as: LASIX Take 20 mg by mouth every morning.   lisinopril 10 MG tablet Commonly known as: ZESTRIL Take 10 mg by mouth every morning.   meclizine 12.5 MG tablet Commonly known as: ANTIVERT Take 12.5 mg by mouth daily as needed for dizziness.   multivitamin-lutein Caps capsule Take 1 capsule by mouth daily. Start taking on: April 18, 2019   niacin 500 MG tablet Take 500 mg by mouth every morning.   pantoprazole 40 MG tablet Commonly known as: PROTONIX Take 1 tablet (40 mg total) by mouth daily. What changed: when to take this       Major procedures and Radiology Reports - PLEASE review detailed and final reports for all details, in brief -   Dg Femur Min 2 Views Right  Result Date: 04/15/2019 CLINICAL DATA:  Nonhealing wound of right leg. EXAM: RIGHT FEMUR 2 VIEWS COMPARISON:  March 12, 2019. FINDINGS: There is no evidence of fracture. Vascular calcifications are noted. Stable curvilinear calcifications are seen in the distal right femoral shaft consistent with infarction or enchondroma. IMPRESSION: Stable curvilinear calcifications seen in distal right femoral shaft consistent with infarction or enchondroma. No acute abnormality is noted. Electronically Signed   By: Marijo Conception M.D.   On: 04/15/2019 13:43    Micro Results   Recent Results (from the past 240 hour(s))  Wound or Superficial Culture     Status: None (Preliminary result)   Collection Time: 04/15/19  1:11 PM   Specimen: Wound  Result Value Ref Range Status   Specimen Description   Final    WOUND  Performed at [facility], [place]  [address] [place], [place] [address]    Special Requests   Final    Normal Performed at [facility], [address], [place], [place] [address]    Gram Stain   Final    RARE WBC PRESENT, PREDOMINANTLY PMN NO ORGANISMS SEEN Performed at [facility], [place] [address], [place], [place] [address]    Culture FEW STAPHYLOCOCCUS EPIDERMIDIS  Final   Report Status PENDING  Incomplete  SARS CORONAVIRUS 2 (TAT 6-24 HRS) Nasopharyngeal Nasopharyngeal Swab     Status: None   Collection Time: 04/15/19  4:12 PM   Specimen: Nasopharyngeal Swab  Result Value Ref Range Status   SARS Coronavirus 2 NEGATIVE NEGATIVE Final    Comment: (NOTE) SARS-CoV-2 target nucleic acids are NOT DETECTED. The SARS-CoV-2 RNA is generally detectable in upper and lower respiratory specimens during the acute phase of infection. Negative results do not preclude SARS-CoV-2 infection, do not rule out co-infections with other pathogens, and should not be used as the sole basis for treatment or other patient management decisions. Negative results must be combined with clinical observations, patient history, and epidemiological information. The expected result is Negative. Fact Sheet for Patients: SugarRoll.be Fact Sheet for Healthcare Providers: https://www.woods-mathews.com/ This test is not yet approved or cleared by the [place] FDA and  has been authorized for detection and/or diagnosis of SARS-CoV-2 by FDA under an Emergency Use Authorization (EUA). This EUA will remain  in effect (meaning this test can be used) for the duration of the COVID-19 declaration under Section 56 4(b)(1) of the Act, 21 U.S.C. section 360bbb-3(b)(1), unless the authorization is terminated or revoked sooner. Performed at [facility], [place] [address], [place], [place] [address]   MRSA PCR Screening     Status: None   Collection Time: 04/15/19 11:03  PM   Specimen: Nasal Mucosa; Nasopharyngeal  Result Value Ref Range Status   MRSA by PCR NEGATIVE NEGATIVE Final    Comment:        The GeneXpert MRSA Assay (FDA approved for NASAL specimens only), is one component of a comprehensive MRSA colonization surveillance program. It is not intended to diagnose MRSA infection nor to guide or monitor treatment for MRSA infections. Performed at [facility], [address], [place], [place] [address]   Aerobic/Anaerobic Culture (surgical/deep wound)     Status: None (Preliminary result)   Collection Time: 04/16/19  9:54 AM   Specimen: Thigh; Wound  Result Value Ref Range Status   Specimen Description   Final    THIGH Performed at [facility], [address], [place], [place] [address]    Special Requests   Final    Normal Performed at [facility], [address], [place], [place] [address]    Gram Stain NO WBC SEEN NO ORGANISMS SEEN   Final   Culture   Final    CULTURE REINCUBATED FOR BETTER GROWTH Performed at [facility], [place] [address], [place], [place] [address]    Report Status PENDING  Incomplete       Today   Subjective    Paul Snyder today has no new complaints, daughter at bedside, questions answered          Patient has been seen and examined prior to discharge   Objective   Blood pressure (!) 99/50, pulse 61, temperature 97.8 F (36.6 C), temperature source Oral, resp. rate 18, height 6' (1.829 m), weight 77.1 kg, SpO2 93 %.   Intake/Output Summary (Last 24 hours) at  04/17/2019 1310 Last data filed at 04/16/2019 1754 Gross per 24 hour  Intake 240 ml  Output -  Net 240 ml    Exam Gen:- Awake Alert, no acute distress  HEENT:- [place].AT, No sclera icterus Neck-Supple Neck,No JVD,.  Lungs-  CTAB , good air movement bilaterally  CV- S1, S2 normal, regular Abd-  +ve B.Sounds, Abd Soft, No tenderness,    Extremity/Skin:- No  edema,   good pulses Psych-affect is appropriate, oriented x3 Neuro-no  new focal deficits, no tremors  MSK--right thigh anterior wound status post debridement with packing --- Right lower part of the leg with abrasion/open wounds with much less erythema, no drainage - Media Information    Document Information  Photos    04/17/2019 08:49  Attached To:  Hospital Encounter on 04/15/19  Source Information  Virl Cagey, MD  Ap-Dept 300      Data Review   CBC w Diff:  Lab Results  Component Value Date   WBC 4.6 04/16/2019   HGB 10.2 (L) 04/16/2019   HGB 16.2 05/14/2009   HCT 33.4 (L) 04/16/2019   HCT 49.0 05/14/2009   PLT 204 04/16/2019   PLT 156 05/14/2009   LYMPHOPCT 13 04/15/2019   LYMPHOPCT 17.6 05/14/2009   MONOPCT 10 04/15/2019   MONOPCT 10.9 05/14/2009   EOSPCT 1 04/15/2019   EOSPCT 1.8 05/14/2009   BASOPCT 0 04/15/2019   BASOPCT 0.5 05/14/2009    CMP:  Lab Results  Component Value Date   NA 138 04/16/2019   K 4.0 04/16/2019   CL 102 04/16/2019   CO2 29 04/16/2019   BUN 16 04/16/2019   CREATININE 0.87 04/16/2019   Total Discharge time is about 33 minutes  Paul Snyder M.D on 04/17/2019 at 1:10 PM  Go to [email or web address] -  for contact info  [facility] - Office  [phone]

## 2019-04-18 LAB — AEROBIC CULTURE W GRAM STAIN (SUPERFICIAL SPECIMEN): Special Requests: NORMAL

## 2019-04-21 LAB — AEROBIC/ANAEROBIC CULTURE W GRAM STAIN (SURGICAL/DEEP WOUND)
Culture: NORMAL
Gram Stain: NONE SEEN
Special Requests: NORMAL

## 2019-05-07 ENCOUNTER — Telehealth: Payer: Self-pay

## 2019-05-07 NOTE — Telephone Encounter (Signed)
[facility] called regarding patient. States that ulcer on lower right leg has lost its scab and is draining pink fluid, no infection. Would like to know if you want patient to have wet to dry bandage or non stick pad with rolled gauze. MD notified.

## 2019-05-09 ENCOUNTER — Encounter: Payer: Self-pay | Admitting: General Surgery

## 2019-05-09 ENCOUNTER — Other Ambulatory Visit: Payer: Self-pay

## 2019-05-09 ENCOUNTER — Ambulatory Visit (HOSPITAL_COMMUNITY)
Admission: RE | Admit: 2019-05-09 | Discharge: 2019-05-09 | Disposition: A | Discharge status: Home / Self Care | Payer: Medicare Other | Source: Ambulatory Visit | Attending: General Surgery | Admitting: General Surgery

## 2019-05-09 ENCOUNTER — Ambulatory Visit (INDEPENDENT_AMBULATORY_CARE_PROVIDER_SITE_OTHER): Payer: Self-pay | Admitting: General Surgery

## 2019-05-09 VITALS — BP 145/78 | HR 55 | Temp 97.5°F | Resp 16 | Ht 72.0 in | Wt 167.0 lb

## 2019-05-09 DIAGNOSIS — S81801D Unspecified open wound, right lower leg, subsequent encounter: Secondary | ICD-10-CM

## 2019-05-09 DIAGNOSIS — M7989 Other specified soft tissue disorders: Secondary | ICD-10-CM

## 2019-05-09 DIAGNOSIS — L089 Local infection of the skin and subcutaneous tissue, unspecified: Secondary | ICD-10-CM

## 2019-05-09 DIAGNOSIS — S8011XS Contusion of right lower leg, sequela: Secondary | ICD-10-CM | POA: Insufficient documentation

## 2019-05-09 NOTE — Progress Notes (Signed)
[facility] Note   HPI:  83 y.o. Male presents to clinic for follow-up evaluation of his right traumatic thigh wound. I did debridement at the bedside while he was in the hospital. His daughter and home health have been doing wet to dry dressing. He had a lower wound on the right leg that I was unaware of that the home health RN reported a scab had fallen off.  Since it had been a while since I saw him, I felt I needed to evaluate the wounds to best determine the next steps. Otherwise doing well, walking, and tolerating dressing changes.  Review of Systems:  No fever or chills Some minor drainage from wounds Some swelling right leg  All other review of systems: otherwise negative   Vital Signs:  BP (!) 145/78 (BP Location: Left Arm, Patient Position: Sitting, Cuff Size: Normal)   Pulse (!) 55   Temp (!) 97.5 F (36.4 C) (Oral)   Resp 16   Ht 6' (1.829 m)   Wt 167 lb (75.8 kg)   SpO2 95%   BMI 22.65 kg/m    Physical Exam:  Physical Exam Vitals reviewed.  HENT:     Head: Normocephalic.  Cardiovascular:     Rate and Rhythm: Normal rate.  Pulmonary:     Effort: Pulmonary effort is normal.  Musculoskeletal:     Comments: Right leg with 1+ edema, upper thigh wound healing, granulation tissue, large hematoma inferior intramuscular on bedside US, does not track to the open wound, unable to see the femoral vessels on bedside US, lower right leg wound 2cm in size, scab probed and tracks deep about 1.5cm, removed excess scab, packed both with saline dampened gauze and covered with ABD and ACE wrap     Imaging 05/09/19 after clinic: CLINICAL DATA:  Wound involving the right medial thigh, now with pain and edema. Evaluate for DVT.  EXAM: RIGHT LOWER EXTREMITY VENOUS DOPPLER ULTRASOUND  TECHNIQUE: Gray-scale sonography with graded compression, as well as color Doppler and duplex ultrasound were performed to evaluate the lower extremity deep venous systems from the  level of the common femoral vein and including the common femoral, femoral, profunda femoral, popliteal and calf veins including the posterior tibial, peroneal and gastrocnemius veins when visible. The superficial great saphenous vein was also interrogated. Spectral Doppler was utilized to evaluate flow at rest and with distal augmentation maneuvers in the common femoral, femoral and popliteal veins.  COMPARISON:  None.  FINDINGS: Contralateral Common Femoral Vein: Respiratory phasicity is normal and symmetric with the symptomatic side. No evidence of thrombus. Normal compressibility.  Common Femoral Vein: No evidence of thrombus. Normal compressibility, respiratory phasicity and response to augmentation.  Saphenofemoral Junction: No evidence of thrombus. Normal compressibility and flow on color Doppler imaging.  Profunda Femoral Vein: No evidence of thrombus. Normal compressibility and flow on color Doppler imaging.  Femoral Vein: No evidence of thrombus. Normal compressibility, respiratory phasicity and response to augmentation.  Popliteal Vein: No evidence of thrombus. Normal compressibility, respiratory phasicity and response to augmentation.  Calf Veins: No evidence of thrombus. Normal compressibility and flow on color Doppler imaging.  Superficial Great Saphenous Vein: No evidence of thrombus. Normal compressibility.  Venous Reflux:  None.  Other Findings: There is a moderate amount of subcutaneous edema at the level of the right calf.  IMPRESSION: No evidence of DVT within the right lower extremity.   Assessment:  83 y.o. yo Male with a history of a traumatic right leg wound. The  upper thigh wound is healing and has granulation tissue but inferior there is an intramuscular hematoma. Given the swelling, I am concerned about compression and DVT formation.  This is unlikely but I am sending him for an [place] to rule out DVT. The Right lower wound has  tracking and hold hematoma expressed. No signs of infection.  Plan:  Continue twice daily wet to dry dressing to the upper right thigh and lower right leg. Keep ace wrap in place unless otherwise told to help with swelling. [place] RLE Duplex- No DVT noted; called and notified the patient and daughter of results   Future Appointments  Date Time Provider [place]  05/09/2019  3:30 PM [facility] 5 [facility] [place] [facility]  06/06/2019  1:00 PM Virl Cagey, MD [facility] None  06/25/2019  8:35 AM Loman Brooklyn, FNP [facility] None    All of the above recommendations were discussed with the patient and patient's family, and all of patient's and family's questions were answered to their expressed satisfaction.  [name], MD [facility] [address] [place], [place] [address] [phone] (office)

## 2019-05-09 NOTE — Patient Instructions (Signed)
Continue twice daily wet to dry dressing to the upper right thigh and lower right leg. Keep ace wrap in place unless otherwise told to help with swelling. Go to the Hospital for a Ultrasound to rule out blood clot in the right leg.

## 2019-05-16 ENCOUNTER — Inpatient Hospital Stay (HOSPITAL_COMMUNITY)
Admission: EM | Admit: 2019-05-16 | Discharge: 2019-05-20 | DRG: 872 | Disposition: A | Discharge status: Home Health | Payer: Medicare Other | Attending: Family Medicine | Admitting: Family Medicine

## 2019-05-16 ENCOUNTER — Emergency Department (HOSPITAL_COMMUNITY): Payer: Medicare Other

## 2019-05-16 ENCOUNTER — Other Ambulatory Visit: Payer: Self-pay

## 2019-05-16 ENCOUNTER — Encounter (HOSPITAL_COMMUNITY): Payer: Self-pay

## 2019-05-16 DIAGNOSIS — Z1624 Resistance to multiple antibiotics: Secondary | ICD-10-CM | POA: Diagnosis present

## 2019-05-16 DIAGNOSIS — R7881 Bacteremia: Secondary | ICD-10-CM | POA: Diagnosis not present

## 2019-05-16 DIAGNOSIS — E872 Acidosis, unspecified: Secondary | ICD-10-CM | POA: Diagnosis present

## 2019-05-16 DIAGNOSIS — C3490 Malignant neoplasm of unspecified part of unspecified bronchus or lung: Secondary | ICD-10-CM | POA: Diagnosis present

## 2019-05-16 DIAGNOSIS — N179 Acute kidney failure, unspecified: Secondary | ICD-10-CM | POA: Diagnosis present

## 2019-05-16 DIAGNOSIS — I2510 Atherosclerotic heart disease of native coronary artery without angina pectoris: Secondary | ICD-10-CM | POA: Diagnosis present

## 2019-05-16 DIAGNOSIS — Z7982 Long term (current) use of aspirin: Secondary | ICD-10-CM | POA: Diagnosis not present

## 2019-05-16 DIAGNOSIS — I252 Old myocardial infarction: Secondary | ICD-10-CM

## 2019-05-16 DIAGNOSIS — I959 Hypotension, unspecified: Secondary | ICD-10-CM | POA: Diagnosis present

## 2019-05-16 DIAGNOSIS — E860 Dehydration: Secondary | ICD-10-CM | POA: Diagnosis present

## 2019-05-16 DIAGNOSIS — L089 Local infection of the skin and subcutaneous tissue, unspecified: Secondary | ICD-10-CM | POA: Diagnosis present

## 2019-05-16 DIAGNOSIS — W19XXXD Unspecified fall, subsequent encounter: Secondary | ICD-10-CM | POA: Diagnosis not present

## 2019-05-16 DIAGNOSIS — R531 Weakness: Secondary | ICD-10-CM | POA: Diagnosis not present

## 2019-05-16 DIAGNOSIS — Z955 Presence of coronary angioplasty implant and graft: Secondary | ICD-10-CM

## 2019-05-16 DIAGNOSIS — W19XXXA Unspecified fall, initial encounter: Secondary | ICD-10-CM

## 2019-05-16 DIAGNOSIS — L0390 Cellulitis, unspecified: Secondary | ICD-10-CM | POA: Diagnosis not present

## 2019-05-16 DIAGNOSIS — E876 Hypokalemia: Secondary | ICD-10-CM | POA: Diagnosis not present

## 2019-05-16 DIAGNOSIS — T148XXA Other injury of unspecified body region, initial encounter: Secondary | ICD-10-CM | POA: Diagnosis present

## 2019-05-16 DIAGNOSIS — A419 Sepsis, unspecified organism: Secondary | ICD-10-CM | POA: Diagnosis present

## 2019-05-16 DIAGNOSIS — J449 Chronic obstructive pulmonary disease, unspecified: Secondary | ICD-10-CM | POA: Diagnosis present

## 2019-05-16 DIAGNOSIS — I509 Heart failure, unspecified: Secondary | ICD-10-CM | POA: Diagnosis present

## 2019-05-16 DIAGNOSIS — R739 Hyperglycemia, unspecified: Secondary | ICD-10-CM | POA: Diagnosis present

## 2019-05-16 DIAGNOSIS — I110 Hypertensive heart disease with heart failure: Secondary | ICD-10-CM | POA: Diagnosis present

## 2019-05-16 DIAGNOSIS — Z79899 Other long term (current) drug therapy: Secondary | ICD-10-CM

## 2019-05-16 DIAGNOSIS — L03115 Cellulitis of right lower limb: Secondary | ICD-10-CM | POA: Diagnosis present

## 2019-05-16 DIAGNOSIS — Z85118 Personal history of other malignant neoplasm of bronchus and lung: Secondary | ICD-10-CM

## 2019-05-16 DIAGNOSIS — Z20828 Contact with and (suspected) exposure to other viral communicable diseases: Secondary | ICD-10-CM | POA: Diagnosis present

## 2019-05-16 DIAGNOSIS — R2681 Unsteadiness on feet: Secondary | ICD-10-CM | POA: Diagnosis present

## 2019-05-16 DIAGNOSIS — I10 Essential (primary) hypertension: Secondary | ICD-10-CM | POA: Diagnosis not present

## 2019-05-16 DIAGNOSIS — Z720 Tobacco use: Secondary | ICD-10-CM | POA: Diagnosis not present

## 2019-05-16 DIAGNOSIS — R2689 Other abnormalities of gait and mobility: Secondary | ICD-10-CM | POA: Diagnosis present

## 2019-05-16 DIAGNOSIS — A498 Other bacterial infections of unspecified site: Secondary | ICD-10-CM | POA: Diagnosis present

## 2019-05-16 DIAGNOSIS — K219 Gastro-esophageal reflux disease without esophagitis: Secondary | ICD-10-CM | POA: Diagnosis not present

## 2019-05-16 DIAGNOSIS — Y92009 Unspecified place in unspecified non-institutional (private) residence as the place of occurrence of the external cause: Secondary | ICD-10-CM

## 2019-05-16 DIAGNOSIS — A499 Bacterial infection, unspecified: Secondary | ICD-10-CM | POA: Diagnosis present

## 2019-05-16 LAB — HEMOGLOBIN A1C
Hgb A1c MFr Bld: 4.8 % (ref 4.8–5.6)
Mean Plasma Glucose: 91.06 mg/dL

## 2019-05-16 LAB — MAGNESIUM: Magnesium: 2.1 mg/dL (ref 1.7–2.4)

## 2019-05-16 LAB — URINALYSIS, ROUTINE W REFLEX MICROSCOPIC
Bacteria, UA: NONE SEEN
Bilirubin Urine: NEGATIVE
Glucose, UA: NEGATIVE mg/dL
Ketones, ur: NEGATIVE mg/dL
Leukocytes,Ua: NEGATIVE
Nitrite: NEGATIVE
Protein, ur: NEGATIVE mg/dL
Specific Gravity, Urine: 1.008 (ref 1.005–1.030)
pH: 5 (ref 5.0–8.0)

## 2019-05-16 LAB — CBC WITH DIFFERENTIAL/PLATELET
Abs Immature Granulocytes: 0.04 10*3/uL (ref 0.00–0.07)
Basophils Absolute: 0 10*3/uL (ref 0.0–0.1)
Basophils Relative: 0 %
Eosinophils Absolute: 0 10*3/uL (ref 0.0–0.5)
Eosinophils Relative: 0 %
HCT: 34.9 % — ABNORMAL LOW (ref 39.0–52.0)
Hemoglobin: 10.7 g/dL — ABNORMAL LOW (ref 13.0–17.0)
Immature Granulocytes: 0 %
Lymphocytes Relative: 4 %
Lymphs Abs: 0.4 10*3/uL — ABNORMAL LOW (ref 0.7–4.0)
MCH: 26.6 pg (ref 26.0–34.0)
MCHC: 30.7 g/dL (ref 30.0–36.0)
MCV: 86.6 fL (ref 80.0–100.0)
Monocytes Absolute: 0.7 10*3/uL (ref 0.1–1.0)
Monocytes Relative: 7 %
Neutro Abs: 8.5 10*3/uL — ABNORMAL HIGH (ref 1.7–7.7)
Neutrophils Relative %: 89 %
Platelets: 176 10*3/uL (ref 150–400)
RBC: 4.03 MIL/uL — ABNORMAL LOW (ref 4.22–5.81)
RDW: 15.6 % — ABNORMAL HIGH (ref 11.5–15.5)
WBC: 9.6 10*3/uL (ref 4.0–10.5)
nRBC: 0 % (ref 0.0–0.2)

## 2019-05-16 LAB — COMPREHENSIVE METABOLIC PANEL
ALT: 9 U/L (ref 0–44)
AST: 15 U/L (ref 15–41)
Albumin: 3.2 g/dL — ABNORMAL LOW (ref 3.5–5.0)
Alkaline Phosphatase: 62 U/L (ref 38–126)
Anion gap: 12 (ref 5–15)
BUN: 26 mg/dL — ABNORMAL HIGH (ref 8–23)
CO2: 23 mmol/L (ref 22–32)
Calcium: 8.9 mg/dL (ref 8.9–10.3)
Chloride: 99 mmol/L (ref 98–111)
Creatinine, Ser: 1.36 mg/dL — ABNORMAL HIGH (ref 0.61–1.24)
GFR calc Af Amer: 56 mL/min — ABNORMAL LOW (ref >60)
GFR calc non Af Amer: 48 mL/min — ABNORMAL LOW (ref >60)
Glucose, Bld: 142 mg/dL — ABNORMAL HIGH (ref 70–99)
Potassium: 3.3 mmol/L — ABNORMAL LOW (ref 3.5–5.1)
Sodium: 134 mmol/L — ABNORMAL LOW (ref 135–145)
Total Bilirubin: 0.6 mg/dL (ref 0.3–1.2)
Total Protein: 6.2 g/dL — ABNORMAL LOW (ref 6.5–8.1)

## 2019-05-16 LAB — POC SARS CORONAVIRUS 2 AG -  ED: SARS Coronavirus 2 Ag: NEGATIVE

## 2019-05-16 LAB — LACTIC ACID, PLASMA
Lactic Acid, Venous: 2.9 mmol/L (ref 0.5–1.9)
Lactic Acid, Venous: 1.6 mmol/L (ref 0.5–1.9)

## 2019-05-16 LAB — APTT: aPTT: 33 seconds (ref 24–36)

## 2019-05-16 LAB — PROTIME-INR
INR: 1.3 — ABNORMAL HIGH (ref 0.8–1.2)
Prothrombin Time: 16 seconds — ABNORMAL HIGH (ref 11.4–15.2)

## 2019-05-16 LAB — RESPIRATORY PANEL BY RT PCR (FLU A&B, COVID)
Influenza A by PCR: NEGATIVE
Influenza B by PCR: NEGATIVE
SARS Coronavirus 2 by RT PCR: NEGATIVE

## 2019-05-16 MED ORDER — SODIUM CHLORIDE 0.9 % IV SOLN: INTRAVENOUS | Status: DC

## 2019-05-16 MED ORDER — ATORVASTATIN CALCIUM 40 MG PO TABS
80.0000 mg | ORAL_TABLET | Freq: Every evening | ORAL | Status: DC
  Administered 2019-05-16 – 2019-05-19 (×4): 80 mg via ORAL
  Filled 2019-05-16 (×4): qty 2

## 2019-05-16 MED ORDER — ONDANSETRON HCL 4 MG/2ML IJ SOLN: 4.0000 mg | Freq: Four times a day (QID) | INTRAMUSCULAR | Status: DC | PRN

## 2019-05-16 MED ORDER — PANTOPRAZOLE SODIUM 40 MG PO TBEC
40.0000 mg | DELAYED_RELEASE_TABLET | Freq: Every day | ORAL | Status: DC
  Administered 2019-05-17 – 2019-05-20 (×4): 40 mg via ORAL
  Filled 2019-05-16 (×5): qty 1

## 2019-05-16 MED ORDER — ASPIRIN EC 81 MG PO TBEC
81.0000 mg | DELAYED_RELEASE_TABLET | Freq: Every day | ORAL | Status: DC
  Administered 2019-05-17 – 2019-05-20 (×4): 81 mg via ORAL
  Filled 2019-05-16 (×8): qty 1

## 2019-05-16 MED ORDER — TRAZODONE HCL 50 MG PO TABS
25.0000 mg | ORAL_TABLET | Freq: Every evening | ORAL | Status: DC | PRN
  Administered 2019-05-18: 25 mg via ORAL
  Filled 2019-05-16: qty 1

## 2019-05-16 MED ORDER — ONDANSETRON HCL 4 MG PO TABS: 4.0000 mg | ORAL_TABLET | Freq: Four times a day (QID) | ORAL | Status: DC | PRN

## 2019-05-16 MED ORDER — SENNOSIDES-DOCUSATE SODIUM 8.6-50 MG PO TABS
1.0000 | ORAL_TABLET | Freq: Every evening | ORAL | Status: DC | PRN
  Filled 2019-05-16: qty 1

## 2019-05-16 MED ORDER — MECLIZINE HCL 12.5 MG PO TABS: 12.5000 mg | ORAL_TABLET | Freq: Every day | ORAL | Status: DC | PRN

## 2019-05-16 MED ORDER — ALBUTEROL SULFATE (2.5 MG/3ML) 0.083% IN NEBU: 2.5000 mg | INHALATION_SOLUTION | RESPIRATORY_TRACT | Status: DC | PRN

## 2019-05-16 MED ORDER — SODIUM CHLORIDE 0.9 % IV SOLN
2.0000 g | INTRAVENOUS | Status: DC
  Administered 2019-05-16 – 2019-05-17 (×2): 2 g via INTRAVENOUS
  Filled 2019-05-16 (×2): qty 20

## 2019-05-16 MED ORDER — ACETAMINOPHEN 325 MG PO TABS: 650.0000 mg | ORAL_TABLET | Freq: Four times a day (QID) | ORAL | Status: DC | PRN

## 2019-05-16 MED ORDER — ALBUTEROL SULFATE HFA 108 (90 BASE) MCG/ACT IN AERS
2.0000 | INHALATION_SPRAY | RESPIRATORY_TRACT | Status: DC | PRN
  Filled 2019-05-16: qty 6.7

## 2019-05-16 MED ORDER — LACTATED RINGERS IV BOLUS (SEPSIS)
1000.0000 mL | Freq: Once | INTRAVENOUS | Status: AC
  Administered 2019-05-16: 1000 mL via INTRAVENOUS

## 2019-05-16 MED ORDER — ACETAMINOPHEN 650 MG RE SUPP: 650.0000 mg | Freq: Four times a day (QID) | RECTAL | Status: DC | PRN

## 2019-05-16 MED ORDER — ENOXAPARIN SODIUM 40 MG/0.4ML ~~LOC~~ SOLN
40.0000 mg | SUBCUTANEOUS | Status: DC
  Administered 2019-05-16 – 2019-05-19 (×4): 40 mg via SUBCUTANEOUS
  Filled 2019-05-16 (×4): qty 0.4

## 2019-05-16 MED ORDER — POTASSIUM CHLORIDE CRYS ER 20 MEQ PO TBCR
40.0000 meq | EXTENDED_RELEASE_TABLET | Freq: Two times a day (BID) | ORAL | Status: AC
  Administered 2019-05-16 (×2): 40 meq via ORAL
  Filled 2019-05-16 (×2): qty 2

## 2019-05-16 MED ORDER — OCUVITE-LUTEIN PO CAPS
1.0000 | ORAL_CAPSULE | Freq: Every day | ORAL | Status: DC
  Administered 2019-05-17 – 2019-05-20 (×4): 1 via ORAL
  Filled 2019-05-16 (×6): qty 1

## 2019-05-16 NOTE — Progress Notes (Signed)
Initial Nutrition Assessment  DOCUMENTATION CODES:   Not applicable  INTERVENTION:  -Ensure Enlive po BID, each supplement provides 350 kcal and 20 grams of protein  -Juven BID, each packet provides 95 calories, 2.5 grams of protein (collagen), and 9.8 grams of carbohydrate (3 grams sugar); also contains 7 grams of L-arginine and L-glutamine, 300 mg vitamin C, 15 mg vitamin E, 1.2 mcg vitamin B-12, 9.5 mg zinc, 200 mg calcium, and 1.5 g  Calcium Beta-hydroxy-Beta-methylbutyrate to support wound healing  -Ocuvite po daily, each MVI provides 200 mg vit C, 40 mg zinc, 55 mcg selenium, 2 mg copper, 2 mg Lutein to support wound healing  NUTRITION DIAGNOSIS:   Increased nutrient needs related to chronic illness, acute illness(sepsis secondary to cellulitis of chronic non healing right lower extremity wound) as evidenced by estimated needs.   GOAL:   Patient will meet greater than or equal to 90% of their needs   MONITOR:   Skin, Supplement acceptance, Weight trends, I & O's, Labs, PO intake  REASON FOR ASSESSMENT:   Consult Wound healing  ASSESSMENT:  RD working remotely.  83 year old male with past medical history of CHF, COPD, GERD, h/o lung cancer and CAD who presents with chronic right leg wound s/p debridement of right leg approximately a month ago. Cultures obtained at that time suggested staph infection, pt is s/p course of oral Augmentin. Patient is followed by surgery for wound and is reported to have been doing fairly well. Over the past several days pt reports progressive weakness in both legs, right greater than left s/p fall at home. In ED, pt was afebrile temperature 99.2, lactic acid 2.9, x-ray of leg with no finding of abscess and admitted for sepsis secondary to cellulitis of right lower extremity.  11/24- debridement;evacuation of liquified hematoma; removal of necrotic and fibrinous tissue.  Patient currently in ED, noted recent [place] orders. Will continue to  monitor for po intake of meals and supplements. Dietary recall to be obtained at follow up. Patient seen by RD on 11/24 during previous admission for wound healing. Recommend continuing nutrition supplements initiated at that time to aid with increased calorie/protien needs; see interventions above.  Current wt 75 kg (165 lbs) Noted 4.62 lb (2.7%) wt loss since last admission. On 11/23 pt wt 77.1 kg (169.62 lbs)Although wt loss is insignificant for time frame, it is concerning given advanced age and history of present illness.   Medications and labs reviewed   NUTRITION - FOCUSED PHYSICAL EXAM: Unable to complete at this time, RD working remotely.    Diet Order:   Diet Order            Diet Heart Room service appropriate? Yes; Fluid consistency: Thin  Diet effective now              EDUCATION NEEDS:   No education needs have been identified at this time  Skin:  Skin Assessment: Reviewed RN Assessment(Cellulitis; right leg)  Last BM:  PTA  Height:   Ht Readings from Last 1 Encounters:  05/16/19 6' (1.829 m)    Weight:   Wt Readings from Last 1 Encounters:  05/16/19 75 kg    Ideal Body Weight:  80.9 kg  BMI:  Body mass index is 22.42 kg/m.  Estimated Nutritional Needs:   Kcal:  1950-2250 (MSJ 1.3-1.5)  Protein:  113-120 (1.5-1.6 g/kg)  Fluid:  >/= 1.8 L/day   Lajuan Lines, RD, LDN Clinical Nutrition [place] Telephone [phone] After Hours/Weekend Pager: [phone]

## 2019-05-16 NOTE — ED Notes (Signed)
Daughter Arrie Aran made aware of bed assignment and patient being moved upstairs.
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
## 2019-05-16 NOTE — ED Triage Notes (Signed)
Pt reports leg weakness 2 days ago.  Reports fell this morning.  Pt states he can't walk because his legs are "too limber."   Dr. Sabra Heck assessing pt in triage.  Pt walks with walker.  Denies loss of consciousness.  Denies hitting head.   Pt lives at home alone.  Pt unable to get up on his own.

## 2019-05-16 NOTE — ED Notes (Signed)
Yochanan [phone] cell-[phone]

## 2019-05-16 NOTE — ED Notes (Signed)
Patient transported to X-ray 

## 2019-05-16 NOTE — ED Notes (Signed)
Date and time results received: 05/16/19 1042 (use smartphrase ".now" to insert current time)  Test: Lactic Critical Value: 2.9 Name of Provider Notified: Dr Sabra Heck Orders Received? Or Actions Taken?: NA
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
## 2019-05-17 DIAGNOSIS — R7881 Bacteremia: Secondary | ICD-10-CM

## 2019-05-17 LAB — CBC WITH DIFFERENTIAL/PLATELET
Abs Immature Granulocytes: 0.04 10*3/uL (ref 0.00–0.07)
Basophils Absolute: 0 10*3/uL (ref 0.0–0.1)
Basophils Relative: 0 %
Eosinophils Absolute: 0 10*3/uL (ref 0.0–0.5)
Eosinophils Relative: 1 %
HCT: 31.7 % — ABNORMAL LOW (ref 39.0–52.0)
Hemoglobin: 9.5 g/dL — ABNORMAL LOW (ref 13.0–17.0)
Immature Granulocytes: 1 %
Lymphocytes Relative: 7 %
Lymphs Abs: 0.5 10*3/uL — ABNORMAL LOW (ref 0.7–4.0)
MCH: 26.2 pg (ref 26.0–34.0)
MCHC: 30 g/dL (ref 30.0–36.0)
MCV: 87.6 fL (ref 80.0–100.0)
Monocytes Absolute: 0.8 10*3/uL (ref 0.1–1.0)
Monocytes Relative: 10 %
Neutro Abs: 6.2 10*3/uL (ref 1.7–7.7)
Neutrophils Relative %: 81 %
Platelets: 177 10*3/uL (ref 150–400)
RBC: 3.62 MIL/uL — ABNORMAL LOW (ref 4.22–5.81)
RDW: 15.5 % (ref 11.5–15.5)
WBC: 7.5 10*3/uL (ref 4.0–10.5)
nRBC: 0 % (ref 0.0–0.2)

## 2019-05-17 LAB — COMPREHENSIVE METABOLIC PANEL
ALT: 14 U/L (ref 0–44)
AST: 31 U/L (ref 15–41)
Albumin: 2.6 g/dL — ABNORMAL LOW (ref 3.5–5.0)
Alkaline Phosphatase: 54 U/L (ref 38–126)
Anion gap: 7 (ref 5–15)
BUN: 20 mg/dL (ref 8–23)
CO2: 26 mmol/L (ref 22–32)
Calcium: 8.6 mg/dL — ABNORMAL LOW (ref 8.9–10.3)
Chloride: 105 mmol/L (ref 98–111)
Creatinine, Ser: 0.8 mg/dL (ref 0.61–1.24)
GFR calc Af Amer: >60 mL/min (ref >60)
GFR calc non Af Amer: >60 mL/min (ref >60)
Glucose, Bld: 99 mg/dL (ref 70–99)
Potassium: 4.3 mmol/L (ref 3.5–5.1)
Sodium: 138 mmol/L (ref 135–145)
Total Bilirubin: 0.6 mg/dL (ref 0.3–1.2)
Total Protein: 5.5 g/dL — ABNORMAL LOW (ref 6.5–8.1)

## 2019-05-17 LAB — BLOOD CULTURE ID PANEL (REFLEXED)
Acinetobacter baumannii: DETECTED — AB
Candida albicans: NOT DETECTED
Candida glabrata: NOT DETECTED
Candida krusei: NOT DETECTED
Candida parapsilosis: NOT DETECTED
Candida tropicalis: NOT DETECTED
Carbapenem resistance: NOT DETECTED
Enterobacter cloacae complex: NOT DETECTED
Enterobacteriaceae species: NOT DETECTED
Enterococcus species: NOT DETECTED
Escherichia coli: NOT DETECTED
Haemophilus influenzae: NOT DETECTED
Klebsiella oxytoca: NOT DETECTED
Klebsiella pneumoniae: NOT DETECTED
Listeria monocytogenes: NOT DETECTED
Neisseria meningitidis: NOT DETECTED
Proteus species: NOT DETECTED
Pseudomonas aeruginosa: NOT DETECTED
Serratia marcescens: NOT DETECTED
Staphylococcus aureus (BCID): NOT DETECTED
Staphylococcus species: NOT DETECTED
Streptococcus agalactiae: NOT DETECTED
Streptococcus pneumoniae: NOT DETECTED
Streptococcus pyogenes: NOT DETECTED
Streptococcus species: NOT DETECTED

## 2019-05-17 LAB — URINE CULTURE: Culture: NO GROWTH

## 2019-05-17 LAB — MAGNESIUM: Magnesium: 2.2 mg/dL (ref 1.7–2.4)

## 2019-05-17 MED ORDER — SODIUM CHLORIDE 0.9 % IV SOLN
1.0000 g | Freq: Three times a day (TID) | INTRAVENOUS | Status: DC
  Administered 2019-05-17 – 2019-05-20 (×10): 1 g via INTRAVENOUS
  Filled 2019-05-17 (×10): qty 1

## 2019-05-17 NOTE — Progress Notes (Signed)
Nana.[place]- MD paged blood cultures positive for gram positive cocci  0153 MD paged again; no new orders, pt stable, will continue to monitor.
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
## 2019-05-17 NOTE — Plan of Care (Signed)

## 2019-05-17 NOTE — Progress Notes (Signed)
[facility]  Leg with less erythema, dressing changed just a little while ago. Bacteremic. On IV meropenem now. Will continue to check on patient while in patient.  Discussed with Dr. Wynetta Emery.  [name], MD [facility] [address] [place], [place] [address] [phone]/ [phone] (office)

## 2019-05-17 NOTE — Progress Notes (Signed)
Pharmacy Antibiotic Note  Paul Snyder is a 83 y.o. male admitted on 05/16/2019 with bacteremia.  Pharmacy has been consulted for Merrem dosing. BCID + Acinetobacter B., gram stains read at [facility]=> d/w micro lab at [facility] and likely not read correctly on gram stain and will update cxs due to low staining.  D/W MD and will escalate tx to [place]: Merrem 1gm IV q8h F/U cxs and clinical progress Monitor V/S, labx  Height: 6' (182.9 cm) Weight: 165 lb 5.5 oz (75 kg) IBW/kg (Calculated) : 77.6  Temp (24hrs), Avg:98.3 F (36.8 C), Min:98.2 F (36.8 C), Max:98.3 F (36.8 C)  Recent Labs  Lab 05/16/19 0958 05/16/19 1239 05/17/19 0601  WBC 9.6  --  7.5  CREATININE 1.36*  --  0.80  LATICACIDVEN 2.9* 1.6  --     Estimated Creatinine Clearance: 76.8 mL/min (by C-G formula based on SCr of 0.8 mg/dL).    No Known Allergies  Antimicrobials this admission: Cetriaxone 12/24 >> 12/25 Merrem 12/25 >>  12/24BCX: [facility] both bottles but lab said staining incorrect and use BCID Microbiology results: Acinetobacter baumannii NOT DETECTED DETECTEDAbnormal     12/24 UCx: pending 12/24 SARS-2 CV is negative 11/23 MRSA PCR: negative  Thank you for allowing pharmacy to be a part of this patient's care.  Isac Sarna, BS Pharm D, BCPS Clinical Pharmacist Pager [phone] 05/17/2019 1:00 PM

## 2019-05-18 LAB — CBC WITH DIFFERENTIAL/PLATELET
Abs Immature Granulocytes: 0.02 10*3/uL (ref 0.00–0.07)
Basophils Absolute: 0 10*3/uL (ref 0.0–0.1)
Basophils Relative: 0 %
Eosinophils Absolute: 0.1 10*3/uL (ref 0.0–0.5)
Eosinophils Relative: 3 %
HCT: 32.1 % — ABNORMAL LOW (ref 39.0–52.0)
Hemoglobin: 9.5 g/dL — ABNORMAL LOW (ref 13.0–17.0)
Immature Granulocytes: 0 %
Lymphocytes Relative: 13 %
Lymphs Abs: 0.6 10*3/uL — ABNORMAL LOW (ref 0.7–4.0)
MCH: 26 pg (ref 26.0–34.0)
MCHC: 29.6 g/dL — ABNORMAL LOW (ref 30.0–36.0)
MCV: 87.9 fL (ref 80.0–100.0)
Monocytes Absolute: 0.5 10*3/uL (ref 0.1–1.0)
Monocytes Relative: 11 %
Neutro Abs: 3.5 10*3/uL (ref 1.7–7.7)
Neutrophils Relative %: 73 %
Platelets: 196 10*3/uL (ref 150–400)
RBC: 3.65 MIL/uL — ABNORMAL LOW (ref 4.22–5.81)
RDW: 15.4 % (ref 11.5–15.5)
WBC: 4.9 10*3/uL (ref 4.0–10.5)
nRBC: 0 % (ref 0.0–0.2)

## 2019-05-18 LAB — COMPREHENSIVE METABOLIC PANEL
ALT: 32 U/L (ref 0–44)
AST: 43 U/L — ABNORMAL HIGH (ref 15–41)
Albumin: 2.5 g/dL — ABNORMAL LOW (ref 3.5–5.0)
Alkaline Phosphatase: 54 U/L (ref 38–126)
Anion gap: 7 (ref 5–15)
BUN: 16 mg/dL (ref 8–23)
CO2: 29 mmol/L (ref 22–32)
Calcium: 8.7 mg/dL — ABNORMAL LOW (ref 8.9–10.3)
Chloride: 104 mmol/L (ref 98–111)
Creatinine, Ser: 0.73 mg/dL (ref 0.61–1.24)
GFR calc Af Amer: >60 mL/min (ref >60)
GFR calc non Af Amer: >60 mL/min (ref >60)
Glucose, Bld: 112 mg/dL — ABNORMAL HIGH (ref 70–99)
Potassium: 4.1 mmol/L (ref 3.5–5.1)
Sodium: 140 mmol/L (ref 135–145)
Total Bilirubin: 0.2 mg/dL — ABNORMAL LOW (ref 0.3–1.2)
Total Protein: 5.4 g/dL — ABNORMAL LOW (ref 6.5–8.1)

## 2019-05-18 LAB — MAGNESIUM: Magnesium: 2.2 mg/dL (ref 1.7–2.4)

## 2019-05-18 MED ORDER — SACCHAROMYCES BOULARDII 250 MG PO CAPS
250.0000 mg | ORAL_CAPSULE | Freq: Two times a day (BID) | ORAL | Status: DC
  Administered 2019-05-18 – 2019-05-20 (×5): 250 mg via ORAL
  Filled 2019-05-18 (×5): qty 1

## 2019-05-18 NOTE — Plan of Care (Signed)
  Problem: Acute Rehab PT Goals(only PT should resolve) Goal: Pt Will Transfer Bed To Chair/Chair To Bed Flowsheets (Taken 05/18/2019 1228) Pt will Transfer Bed to Chair/Chair to Bed: with supervision Note: And RW Goal: Pt Will Ambulate Flowsheets (Taken 05/18/2019 1228) Pt will Ambulate:  with supervision  100 feet  with least restrictive assistive device Goal: Pt/caregiver will Perform Home Exercise Program Flowsheets (Taken 05/18/2019 1228) Pt/caregiver will Perform Home Exercise Program:  For increased strengthening  Independently   12:29 PM, 05/18/19 Jerene Pitch, DPT Physical Therapy with [facility]  [phone] office

## 2019-05-18 NOTE — Progress Notes (Signed)
PROGRESS NOTE [facility]   Paul Snyder  BJY:[id number]  DOB: 1935-10-04  DOA: 05/16/2019 PCP: Loman Brooklyn, FNP   Brief Admission Hx: 83 y.o. male with medical history significant of CHF, COPD, GERD, history of lung cancer and coronary artery disease has been dealing with a chronic right leg wound for the past several weeks.  He was admitted with cellulitis and now is found to be bacteremic.  MDM/Assessment & Plan:   1. Cellulitis of the right leg-nonpurulent and we initially started on ceftriaxone 2 g every 24 hours but his treatment has been escalated to meropenem given positive blood culture results.    Following final culture and sensitivities.  Continue supportive therapy as ordered. 2. Bacteremia-antibiotics have been escalated and will follow micro results. 3. Chronic right leg wounds-these wounds appear to be healing very well.    Appreciate surgery following.  Wounds seems to be doing well. 4. Sepsis secondary to cellulitis-he has gram-positive cocci and actinobacter in the blood cultures, he seems to be responding well to IV fluids and antibiotics and other supportive therapies.  Lactic acid has normalized. 5. Generalized weakness-multifactorial given bacteremia, advanced age and comorbidities and acute cellulitis which is being treated as above we will get a PT evaluation as he remains a high fall risk.  PT is recommending HHPT. Added RN for wound care.  6. Hypotension-resolved with fluid bolus.  Continue to monitor closely.  Holding home blood pressure medications at this time. 7. Hypokalemia-this has been repleted. 8. Hyperglycemia-likely reactive due to sepsis, hemoglobin A1c was 4.8% which is within normal limits.  He does not have diabetes mellitus. 9. GERD-Protonix ordered for GI protection. 10. AKI - secondary to dehydration, this has resolved with gentle fluids.   DVT prophylaxis: Lovenox Code Status: Full Family Communication: Patient updated at bedside,  verbalized understanding Disposition Plan: Inpatient MedSurg Consults called: Surgery care Admission status: home with [facility] PT, RN  Consultants:  surgery  Procedures:  n/a  Antimicrobials:  Ceftriaxone 12/24-12/25  Meropenem 12/25 >>   Subjective: Patient reports that his leg is feeling better.  He has no complaints today.    Objective: Vitals:   05/17/19 1330 05/17/19 2014 05/17/19 2100 05/18/19 0433  BP: (!) 105/57  111/62 117/60  Pulse: 67  61 60  Resp: 16  17 18   Temp: 98.3 F (36.8 C)  97.8 F (36.6 C) 97.7 F (36.5 C)  TempSrc: Oral  Oral   SpO2: 96% 92% 95% 95%  Weight:      Height:        Intake/Output Summary (Last 24 hours) at 05/18/2019 1300 Last data filed at 05/18/2019 0865 Gross per 24 hour  Intake 930 ml  Output 1200 ml  Net -270 ml   Filed Weights   05/16/19 0939  Weight: 75 kg     REVIEW OF SYSTEMS  As per history otherwise all reviewed and reported negative  Exam:  General exam: Chronically ill-appearing male awake and alert in no distress sitting up in the bed. Respiratory system: Clear. No increased work of breathing. Cardiovascular system: S1 & S2 heard. No JVD, murmurs, gallops, clicks or pedal edema. Gastrointestinal system: Abdomen is nondistended, soft and nontender. Normal bowel sounds heard. Central nervous system: Alert and oriented. No focal neurological deficits. Extremities: Right leg with less erythema, heat and no drainage from wound on the thigh and calf.  Those wounds show good granulation and healing.  Data Reviewed: Basic Metabolic Panel: Recent Labs  Lab 05/16/19 [id number]  05/16/19 1239 05/17/19 0601 05/18/19 0601  NA 134*  --  138 140  K 3.3*  --  4.3 4.1  CL 99  --  105 104  CO2 23  --  26 29  GLUCOSE 142*  --  99 112*  BUN 26*  --  20 16  CREATININE 1.36*  --  0.80 0.73  CALCIUM 8.9  --  8.6* 8.7*  MG  --  2.1 2.2 2.2   Liver Function Tests: Recent Labs  Lab 05/16/19 0958 05/17/19 0601  05/18/19 0601  AST 15 31 43*  ALT 9 14 32  ALKPHOS 62 54 54  BILITOT 0.6 0.6 0.2*  PROT 6.2* 5.5* 5.4*  ALBUMIN 3.2* 2.6* 2.5*   No results for input(s): LIPASE, AMYLASE in the last 168 hours. No results for input(s): AMMONIA in the last 168 hours. CBC: Recent Labs  Lab 05/16/19 0958 05/17/19 0601 05/18/19 0601  WBC 9.6 7.5 4.9  NEUTROABS 8.5* 6.2 3.5  HGB 10.7* 9.5* 9.5*  HCT 34.9* 31.7* 32.1*  MCV 86.6 87.6 87.9  PLT 176 177 196   Cardiac Enzymes: No results for input(s): CKTOTAL, CKMB, CKMBINDEX, TROPONINI in the last 168 hours. CBG (last 3)  No results for input(s): GLUCAP in the last 72 hours. Recent Results (from the past 240 hour(s))  Urine culture     Status: None   Collection Time: 05/16/19  9:46 AM   Specimen: In/Out Cath Urine  Result Value Ref Range Status   Specimen Description   Final    IN/OUT CATH URINE Performed at [facility], [address], [place], [place] [address]    Special Requests   Final    NONE Performed at [facility], [address], [place], [place] [address]    Culture   Final    NO GROWTH Performed at [facility], [place] [address], [place], [place] [address]    Report Status 05/17/2019 FINAL  Final  Blood Culture (routine x 2)     Status: Abnormal (Preliminary result)   Collection Time: 05/16/19 10:00 AM   Specimen: BLOOD LEFT ARM  Result Value Ref Range Status   Specimen Description   Final    BLOOD LEFT ARM Performed at [facility], [address] [place], [place] [address]    Special Requests   Final    BOTTLES DRAWN AEROBIC AND ANAEROBIC Blood Culture results may not be optimal due to an excessive volume of blood received in culture bottles Performed at [place]., [place], [place] [address]    Culture  Setup Time   Final    ANAEROBIC BOTTLE ONLY Gram Stain Report Called to,Read Back By and Verified With: PEACH,RN @0046  05/17/19 MKELLY CRITICAL RESULT CALLED TO, READ BACK BY AND VERIFIED WITH: RN  P SENNHC [id number] AT 715 BY CM CORRECTED RESULTS GRAM NEGATIVE RODS PREVIOUSLY REPORTED AS: GRAM POSITIVE COCCI CORRECTED RESULTS CALLED TO: RN P Overton [id number] T 715 BY CM Performed at [facility], [place] [address], [place], [place] [address]    Culture ACINETOBACTER BAUMANNII (A)  Final   Report Status PENDING  Incomplete  Blood Culture (routine x 2)     Status: Abnormal (Preliminary result)   Collection Time: 05/16/19 10:01 AM   Specimen: BLOOD RIGHT ARM  Result Value Ref Range Status   Specimen Description   Final    BLOOD RIGHT ARM Performed at [facility], [address] [place], [place] [address]    Special  Requests   Final    BOTTLES DRAWN AEROBIC AND ANAEROBIC Blood Culture results may not be optimal due to an excessive volume of blood received in culture bottles Performed at [place] [place], [place] [address]    Culture  Setup Time   Final    ANAEROBIC BOTTLE ONLY Gram Stain Report Called to,Read Back By and Verified With: PEACH,RN @0047  05/17/19 MKELLY CRITICAL RESULT CALLED TO, READ BACK BY AND VERIFIED WITH: RN P SENNHC [id number] AT 717 AM BY CM CORRECTED RESULTS GRAM NEGATIVE RODS PREVIOUSLY REPORTED AS: GRAM POSITIVE COCCI CORRECTED RESULTS CALLED TO: RN PSENNHC [id number] AT 717 AM BY CM    Culture (A)  Final    ACINETOBACTER BAUMANNII SUSCEPTIBILITIES TO FOLLOW Performed at [facility], [place] [address] [place], [place] [address]    Report Status PENDING  Incomplete  Blood Culture ID Panel (Reflexed)     Status: Abnormal   Collection Time: 05/16/19 10:01 AM  Result Value Ref Range Status   Enterococcus species NOT DETECTED NOT DETECTED Final   Listeria monocytogenes NOT DETECTED NOT DETECTED Final   Staphylococcus species NOT DETECTED NOT DETECTED Final   Staphylococcus aureus (BCID) NOT DETECTED NOT DETECTED Final   Streptococcus species NOT DETECTED NOT DETECTED Final   Streptococcus agalactiae NOT DETECTED NOT DETECTED Final    Streptococcus pneumoniae NOT DETECTED NOT DETECTED Final   Streptococcus pyogenes NOT DETECTED NOT DETECTED Final   Acinetobacter baumannii DETECTED (A) NOT DETECTED Final    Comment: CRITICAL RESULT CALLED TO, READ BACK BY AND VERIFIED WITH: RN PEACH SENNCH [id number] AT 715 AM BY CM    Enterobacteriaceae species NOT DETECTED NOT DETECTED Final   Enterobacter cloacae complex NOT DETECTED NOT DETECTED Final   Escherichia coli NOT DETECTED NOT DETECTED Final   Klebsiella oxytoca NOT DETECTED NOT DETECTED Final   Klebsiella pneumoniae NOT DETECTED NOT DETECTED Final   Proteus species NOT DETECTED NOT DETECTED Final   Serratia marcescens NOT DETECTED NOT DETECTED Final   Carbapenem resistance NOT DETECTED NOT DETECTED Final   Haemophilus influenzae NOT DETECTED NOT DETECTED Final   Neisseria meningitidis NOT DETECTED NOT DETECTED Final   Pseudomonas aeruginosa NOT DETECTED NOT DETECTED Final   Candida albicans NOT DETECTED NOT DETECTED Final   Candida glabrata NOT DETECTED NOT DETECTED Final   Candida krusei NOT DETECTED NOT DETECTED Final   Candida parapsilosis NOT DETECTED NOT DETECTED Final   Candida tropicalis NOT DETECTED NOT DETECTED Final    Comment: Performed at [facility], [place]. [address], [place], [place] [address]  Respiratory Panel by RT PCR (Flu A&B, Covid) - Nasopharyngeal Swab     Status: None   Collection Time: 05/16/19  4:19 PM   Specimen: Nasopharyngeal Swab  Result Value Ref Range Status   SARS Coronavirus 2 by RT PCR NEGATIVE NEGATIVE Final    Comment: (NOTE) SARS-CoV-2 target nucleic acids are NOT DETECTED. The SARS-CoV-2 RNA is generally detectable in upper respiratoy specimens during the acute phase of infection. The lowest concentration of SARS-CoV-2 viral copies this assay can detect is 131 copies/mL. A negative result does not preclude SARS-Cov-2 infection and should not be used as the sole basis for treatment or other patient management decisions. A  negative result may occur with  improper specimen collection/handling, submission of specimen other than nasopharyngeal swab, presence of viral mutation(s) within the areas targeted by this assay, and inadequate number of viral copies (<131 copies/mL). A negative result must be combined  with clinical observations, patient history, and epidemiological information. The expected result is Negative. Fact Sheet for Patients:  PinkCheek.be Fact Sheet for Healthcare Providers:  GravelBags.it This test is not yet ap proved or cleared by the [place] FDA and  has been authorized for detection and/or diagnosis of SARS-CoV-2 by FDA under an Emergency Use Authorization (EUA). This EUA will remain  in effect (meaning this test can be used) for the duration of the COVID-19 declaration under Section 564(b)(1) of the Act, 21 U.S.C. section 360bbb-3(b)(1), unless the authorization is terminated or revoked sooner.    Influenza A by PCR NEGATIVE NEGATIVE Final   Influenza B by PCR NEGATIVE NEGATIVE Final    Comment: (NOTE) The Xpert Xpress SARS-CoV-2/FLU/RSV assay is intended as an aid in  the diagnosis of influenza from Nasopharyngeal swab specimens and  should not be used as a sole basis for treatment. Nasal washings and  aspirates are unacceptable for Xpert Xpress SARS-CoV-2/FLU/RSV  testing. Fact Sheet for Patients: PinkCheek.be Fact Sheet for Healthcare Providers: GravelBags.it This test is not yet approved or cleared by the [place] FDA and  has been authorized for detection and/or diagnosis of SARS-CoV-2 by  FDA under an Emergency Use Authorization (EUA). This EUA will remain  in effect (meaning this test can be used) for the duration of the  Covid-19 declaration under Section 564(b)(1) of the Act, 21  U.S.C. section 360bbb-3(b)(1), unless the authorization is   terminated or revoked. Performed at [facility], [address] [place], [place] [address]      Studies: No results found.   Scheduled Meds: . aspirin EC  81 mg Oral Q breakfast  . atorvastatin  80 mg Oral QPM  . enoxaparin (LOVENOX) injection  40 mg Subcutaneous Q24H  . multivitamin-lutein  1 capsule Oral Daily  . pantoprazole  40 mg Oral Daily  . saccharomyces boulardii  250 mg Oral BID   Continuous Infusions: . sodium chloride 35 mL/hr at 05/17/19 2216  . meropenem (MERREM) IV 1 g (05/18/19 0533)    Principal Problem:   Sepsis due to cellulitis [facility]) Active Problems:   Essential hypertension   COPD (chronic obstructive pulmonary disease) (HCC)   GERD (gastroesophageal reflux disease)   Wound infection   CAD (coronary artery disease)-- [place] IN 06/2017   Tobacco abuse   Traumatic open wound of right lower leg with infection   AKI (acute kidney injury) ([place])   Hypokalemia   Hypotension   CHF (congestive heart failure) (HCC)   Dehydration   Generalized weakness   Fall at home   Gait instability   Lactic acidosis   Lung cancer ([place])   Time spent:   Irwin Brakeman, MD [facility] 05/18/2019, 1:00 PM    LOS: 2 days  How to contact the [facility] Attending or Consulting provider [place] or covering provider during after hours [place], for this patient?  1. Check the care team in [facility] and look for a) attending/consulting TRH provider listed and b) the [facility] team listed 2. Log into [email or web address] and use [place]'s universal password to access. If you do not have the password, please contact the hospital operator. 3. Locate the [facility] provider you are looking for under [facility] and page to a number that you can be directly reached. 4. If you still have difficulty reaching the provider, please page the [facility] (Director on Call) for the Hospitalists listed on amion for assistance.

## 2019-05-18 NOTE — Evaluation (Signed)
Physical Therapy Evaluation Patient Details Name: Paul Snyder MRN: [id number] DOB: 1935-09-01 Today's Date: 05/18/2019   History of Present Illness  83 y.o. male with medical history significant of CHF, COPD, GERD, history of lung cancer and coronary artery disease has been dealing with a chronic right leg wound for the past several weeks.  He was admitted with cellulitis and now is found to be bacteremic.  Clinical Impression  Patient tolerated therapy well today despite initial uncertainty with walking. Unsteady on feet and discussed use of RW with ambulation secondary to patent's fall within his house when going to the bathroom. Patient able to ambulate well with walker but did report fatigue in legs. Patient educated on importance of using walker when out of bed and that he needs assistance when out of bed. Patient currently lives alone but states that his daughter lives near by and can do intermittent cooking/cleaning and assist him as able. If patient's daughter is able to provide that level of care I recommend discharge home with HHPT.  Patient will continue to benefit from skilled physical therapy in hospital and recommended venue below to increase strength, balance, endurance for safe ADLs and gait.    Follow Up Recommendations Home health PT;Supervision for mobility/OOB    Equipment Recommendations  Rolling walker with 5" wheels    Recommendations for Other Services       Precautions / Restrictions Precautions Precautions: Fall Restrictions Weight Bearing Restrictions: No      Mobility  Bed Mobility Overal bed mobility: Modified Independent             General bed mobility comments: increased time to perform bed mobilities  Transfers Overall transfer level: Needs assistance Equipment used: Rolling walker (2 wheeled) Transfers: Sit to/from Stand Sit to Stand: Min guard         General transfer comment: RW, unsteady on feet  Ambulation/Gait Ambulation/Gait  assistance: Min guard Gait Distance (Feet): 40 Feet Assistive device: Rolling walker (2 wheeled) Gait Pattern/deviations: Decreased step length - left;Trunk flexed;Decreased step length - right Gait velocity: decreased   General Gait Details: decreased step lengths, fatigue noted, slight unsteadiness noted  Stairs            Wheelchair Mobility    Modified Rankin (Stroke Patients Only)       Balance Overall balance assessment: Modified Independent                                           Pertinent Vitals/Pain Pain Assessment: No/denies pain    Home Living Family/patient expects to be discharged to:: Private residence Living Arrangements: Alone Available Help at Discharge: Family;Available PRN/intermittently Type of Home: House Home Access: Stairs to enter Entrance Stairs-Rails: None Entrance Stairs-Number of Steps: 1 Home Layout: One level Home Equipment: Cane - single point      Prior Function Level of Independence: Independent               Hand Dominance        Extremity/Trunk Assessment        Lower Extremity Assessment Lower Extremity Assessment: Generalized weakness       Communication   Communication: No difficulties  Cognition Arousal/Alertness: Awake/alert Behavior During Therapy: WFL for tasks assessed/performed Overall Cognitive Status: Within Functional Limits for tasks assessed  General Comments      Exercises General Exercises - Lower Extremity Long Arc Quad: AROM;Strengthening;Both;10 reps;Seated Hip Flexion/Marching: AROM;Strengthening;Both;10 reps;Seated Toe Raises: AROM;Strengthening;Both;10 reps;Seated Heel Raises: AROM;Strengthening;Both;10 reps;Seated   Assessment/Plan    PT Assessment Patient needs continued PT services  PT Problem List Decreased strength;Decreased range of motion;Decreased activity tolerance;Decreased balance;Decreased  mobility       PT Treatment Interventions Gait training;DME instruction;Therapeutic activities;Stair training;Therapeutic exercise;Patient/family education;Functional mobility training;Balance training    PT Goals (Current goals can be found in the Care Plan section)  Acute Rehab PT Goals Patient Stated Goal: to go home PT Goal Formulation: With patient Time For Goal Achievement: 06/01/19 Potential to Achieve Goals: Good    Frequency Min 3X/week   Barriers to discharge Decreased caregiver support daughter lives about 15 minutes away and assist as needed a few times a day    Co-evaluation               AM-PAC PT "6 Clicks" Mobility  Outcome Measure Help needed turning from your back to your side while in a flat bed without using bedrails?: None Help needed moving from lying on your back to sitting on the side of a flat bed without using bedrails?: None Help needed moving to and from a bed to a chair (including a wheelchair)?: None Help needed standing up from a chair using your arms (e.g., wheelchair or bedside chair)?: A Little Help needed to walk in hospital room?: A Little Help needed climbing 3-5 steps with a railing? : A Lot 6 Click Score: 20    End of Session Equipment Utilized During Treatment: Gait belt Activity Tolerance: Patient limited by fatigue;Patient tolerated treatment well Patient left: in chair;with call bell/phone within reach;with chair alarm set Nurse Communication: Mobility status PT Visit Diagnosis: Unsteadiness on feet (R26.81);Other abnormalities of gait and mobility (R26.89);Muscle weakness (generalized) (M62.81);Difficulty in walking, not elsewhere classified (R26.2);History of falling (Z91.81)    Time: 0998-3382 PT Time Calculation (min) (ACUTE ONLY): 28 min   Charges:   PT Evaluation $PT Eval Low Complexity: 1 Low PT Treatments $Therapeutic Exercise: 8-22 mins        12:23 PM, 05/18/19 Jerene Pitch, DPT Physical Therapy with  [facility]  [phone] office

## 2019-05-19 ENCOUNTER — Inpatient Hospital Stay: Payer: Self-pay

## 2019-05-19 DIAGNOSIS — A419 Sepsis, unspecified organism: Principal | ICD-10-CM

## 2019-05-19 DIAGNOSIS — A499 Bacterial infection, unspecified: Secondary | ICD-10-CM | POA: Diagnosis present

## 2019-05-19 DIAGNOSIS — A498 Other bacterial infections of unspecified site: Secondary | ICD-10-CM | POA: Diagnosis present

## 2019-05-19 DIAGNOSIS — L0390 Cellulitis, unspecified: Secondary | ICD-10-CM

## 2019-05-19 LAB — CULTURE, BLOOD (ROUTINE X 2)

## 2019-05-19 LAB — COMPREHENSIVE METABOLIC PANEL
ALT: 38 U/L (ref 0–44)
AST: 37 U/L (ref 15–41)
Albumin: 2.5 g/dL — ABNORMAL LOW (ref 3.5–5.0)
Alkaline Phosphatase: 57 U/L (ref 38–126)
Anion gap: 6 (ref 5–15)
BUN: 13 mg/dL (ref 8–23)
CO2: 30 mmol/L (ref 22–32)
Calcium: 9.1 mg/dL (ref 8.9–10.3)
Chloride: 104 mmol/L (ref 98–111)
Creatinine, Ser: 0.64 mg/dL (ref 0.61–1.24)
GFR calc Af Amer: >60 mL/min (ref >60)
GFR calc non Af Amer: >60 mL/min (ref >60)
Glucose, Bld: 107 mg/dL — ABNORMAL HIGH (ref 70–99)
Potassium: 4.1 mmol/L (ref 3.5–5.1)
Sodium: 140 mmol/L (ref 135–145)
Total Bilirubin: 0.5 mg/dL (ref 0.3–1.2)
Total Protein: 5.6 g/dL — ABNORMAL LOW (ref 6.5–8.1)

## 2019-05-19 LAB — MAGNESIUM: Magnesium: 2.2 mg/dL (ref 1.7–2.4)

## 2019-05-19 LAB — CBC WITH DIFFERENTIAL/PLATELET
Abs Immature Granulocytes: 0.01 10*3/uL (ref 0.00–0.07)
Basophils Absolute: 0 10*3/uL (ref 0.0–0.1)
Basophils Relative: 1 %
Eosinophils Absolute: 0.2 10*3/uL (ref 0.0–0.5)
Eosinophils Relative: 5 %
HCT: 33.5 % — ABNORMAL LOW (ref 39.0–52.0)
Hemoglobin: 10 g/dL — ABNORMAL LOW (ref 13.0–17.0)
Immature Granulocytes: 0 %
Lymphocytes Relative: 15 %
Lymphs Abs: 0.7 10*3/uL (ref 0.7–4.0)
MCH: 26.1 pg (ref 26.0–34.0)
MCHC: 29.9 g/dL — ABNORMAL LOW (ref 30.0–36.0)
MCV: 87.5 fL (ref 80.0–100.0)
Monocytes Absolute: 0.4 10*3/uL (ref 0.1–1.0)
Monocytes Relative: 10 %
Neutro Abs: 3 10*3/uL (ref 1.7–7.7)
Neutrophils Relative %: 69 %
Platelets: 228 10*3/uL (ref 150–400)
RBC: 3.83 MIL/uL — ABNORMAL LOW (ref 4.22–5.81)
RDW: 15.5 % (ref 11.5–15.5)
WBC: 4.4 10*3/uL (ref 4.0–10.5)
nRBC: 0 % (ref 0.0–0.2)

## 2019-05-19 NOTE — Progress Notes (Signed)
PROGRESS NOTE [facility] - Inpatient   Deunta Beneke Vanhecke  KVQ:[id number]  DOB: 1935-12-01  DOA: 05/16/2019 PCP: Loman Brooklyn, FNP   Brief Admission Hx: 83 y.o. male with medical history significant of CHF, COPD, GERD, history of lung cancer and coronary artery disease has been dealing with a chronic right leg wound for the past several weeks.  He was admitted with cellulitis and now is found to be bacteremic.  MDM/Assessment & Plan:   1. Cellulitis of the right leg-nonpurulent and we initially started on ceftriaxone 2 g every 24 hours but his treatment has been escalated to meropenem given positive blood culture results.   Continue supportive therapy as ordered. 2. Acinetobacter Bacteremia-Multi-drug resistant.  I spoke with ID Dr Johnnye Sima and could DC home on doripenem or meropenem to complete full 10 day course of treatment.  Will arrange for home PICC Line.  3. Chronic right leg wounds-these wounds appear to be healing very well.    Appreciate surgery following.  Wounds seems to be doing well. 4. Sepsis secondary to cellulitis-he has gram-positive cocci and actinobacter in the blood cultures, he seems to be responding well to IV fluids and antibiotics and other supportive therapies.  Lactic acid has normalized. 5. Generalized weakness-multifactorial given bacteremia, advanced age and comorbidities and acute cellulitis which is being treated as above we will get a PT evaluation as he remains a high fall risk.  PT is recommending HHPT. Added RN for wound care.  6. Hypotension-resolved with fluid bolus.  Continue to monitor closely.  Holding home blood pressure medications at this time. 7. Hypokalemia-this has been repleted. 8. Hyperglycemia-likely reactive due to sepsis, hemoglobin A1c was 4.8% which is within normal limits.  He does not have diabetes mellitus. 9. GERD-Protonix ordered for GI protection. 10. AKI - secondary to dehydration, this has resolved with gentle fluids.   DVT  prophylaxis: Lovenox Code Status: Full Family Communication: Patient updated at bedside, verbalized understanding Disposition Plan: Inpatient MedSurg Consults called: Surgery care Admission status: home with [facility] PT, RN likely 12/28   Consultants:  surgery  Procedures:  n/a  Antimicrobials:  Ceftriaxone 12/24-12/25  Meropenem 12/25 >>   Subjective: Patient reports that his leg is feeling better.  He has no complaints today.  He is anxious to get home.   Objective: Vitals:   05/18/19 1307 05/18/19 2110 05/18/19 2144 05/19/19 0608  BP: (!) 111/57 130/61  (!) 115/58  Pulse: (!) 58 (!) 56  62  Resp: 20 20  20   Temp: 97.7 F (36.5 C) 98 F (36.7 C)  97.7 F (36.5 C)  TempSrc: Oral Oral  Oral  SpO2: 96% 95% 94% 93%  Weight:      Height:        Intake/Output Summary (Last 24 hours) at 05/19/2019 1239 Last data filed at 05/19/2019 6433 Gross per 24 hour  Intake 240 ml  Output 900 ml  Net -660 ml   Filed Weights   05/16/19 0939  Weight: 75 kg   REVIEW OF SYSTEMS  As per history otherwise all reviewed and reported negative  Exam:  General exam: Chronically ill-appearing male awake and alert in no distress sitting up in the bed. Respiratory system: Clear. No increased work of breathing. Cardiovascular system: S1 & S2 heard. No JVD, murmurs, gallops, clicks or pedal edema. Gastrointestinal system: Abdomen is nondistended, soft and nontender. Normal bowel sounds heard. Central nervous system: Alert and oriented. No focal neurological deficits. Extremities: Right leg with resolved erythema, resolved heat and  no drainage from wound on the thigh and calf.  Those wounds show good granulation and healing.  Data Reviewed: Basic Metabolic Panel: Recent Labs  Lab 05/16/19 0958 05/16/19 1239 05/17/19 0601 05/18/19 0601 05/19/19 0616  NA 134*  --  138 140 140  K 3.3*  --  4.3 4.1 4.1  CL 99  --  105 104 104  CO2 23  --  26 29 30   GLUCOSE 142*  --  99 112* 107*    BUN 26*  --  20 16 13   CREATININE 1.36*  --  0.80 0.73 0.64  CALCIUM 8.9  --  8.6* 8.7* 9.1  MG  --  2.1 2.2 2.2 2.2   Liver Function Tests: Recent Labs  Lab 05/16/19 0958 05/17/19 0601 05/18/19 0601 05/19/19 0616  AST 15 31 43* 37  ALT 9 14 32 38  ALKPHOS 62 54 54 57  BILITOT 0.6 0.6 0.2* 0.5  PROT 6.2* 5.5* 5.4* 5.6*  ALBUMIN 3.2* 2.6* 2.5* 2.5*   No results for input(s): LIPASE, AMYLASE in the last 168 hours. No results for input(s): AMMONIA in the last 168 hours. CBC: Recent Labs  Lab 05/16/19 0958 05/17/19 0601 05/18/19 0601 05/19/19 0616  WBC 9.6 7.5 4.9 4.4  NEUTROABS 8.5* 6.2 3.5 3.0  HGB 10.7* 9.5* 9.5* 10.0*  HCT 34.9* 31.7* 32.1* 33.5*  MCV 86.6 87.6 87.9 87.5  PLT 176 177 196 228   Cardiac Enzymes: No results for input(s): CKTOTAL, CKMB, CKMBINDEX, TROPONINI in the last 168 hours. CBG (last 3)  No results for input(s): GLUCAP in the last 72 hours. Recent Results (from the past 240 hour(s))  Urine culture     Status: None   Collection Time: 05/16/19  9:46 AM   Specimen: In/Out Cath Urine  Result Value Ref Range Status   Specimen Description   Final    IN/OUT CATH URINE Performed at [facility], [address], [place], [place] [address]    Special Requests   Final    NONE Performed at [facility], [address], [place], [place] [address]    Culture   Final    NO GROWTH Performed at [facility], [place] [address], [place], [place] [address]    Report Status 05/17/2019 FINAL  Final  Blood Culture (routine x 2)     Status: Abnormal   Collection Time: 05/16/19 10:00 AM   Specimen: BLOOD LEFT ARM  Result Value Ref Range Status   Specimen Description   Final    BLOOD LEFT ARM Performed at [facility], [address], [place], [place] [address]    Special Requests   Final    BOTTLES DRAWN AEROBIC AND ANAEROBIC Blood Culture results may not be optimal due to an excessive volume of blood received in culture bottles Performed at [place] [place], [place] [address]    Culture  Setup Time   Final    ANAEROBIC BOTTLE ONLY Gram Stain Report Called to,Read Back By and Verified With: PEACH,RN @0046  05/17/19 MKELLY CRITICAL RESULT CALLED TO, READ BACK BY AND VERIFIED WITH: RN P SENNHC [id number] AT 715 BY CM CORRECTED RESULTS GRAM NEGATIVE RODS PREVIOUSLY REPORTED AS: GRAM POSITIVE COCCI CORRECTED RESULTS CALLED TO: RN P SENNHC [id number] T 715 BY CM    Culture (A)  Final    ACINETOBACTER BAUMANNII SUSCEPTIBILITIES PERFORMED ON PREVIOUS CULTURE WITHIN THE LAST 5 DAYS. Performed at [facility], [place] [address], [place],  [address]  Report Status 05/19/2019 FINAL  Final  Blood Culture (routine x 2)     Status: Abnormal   Collection Time: 05/16/19 10:01 AM   Specimen: BLOOD RIGHT ARM  Result Value Ref Range Status   Specimen Description   Final    BLOOD RIGHT ARM Performed at [facility], [address] [place], [place] [address]    Special Requests   Final    BOTTLES DRAWN AEROBIC AND ANAEROBIC Blood Culture results may not be optimal due to an excessive volume of blood received in culture bottles Performed at [place] [place], [place] [address]    Culture  Setup Time   Final    ANAEROBIC BOTTLE ONLY Gram Stain Report Called to,Read Back By and Verified With: PEACH,RN @0047  05/17/19 MKELLY CRITICAL RESULT CALLED TO, READ BACK BY AND VERIFIED WITH: RN P SENNHC [id number] AT 717 AM BY CM CORRECTED RESULTS GRAM NEGATIVE RODS PREVIOUSLY REPORTED AS: GRAM POSITIVE COCCI CORRECTED RESULTS CALLED TO: RN PSENNHC [id number] AT 717 AM BY CM Performed at [facility], [place] [address] [place], [place] [address]    Culture (A)  Final    ACINETOBACTER BAUMANNII MULTI-DRUG RESISTANT ORGANISM    Report Status 05/19/2019 FINAL  Final   Organism ID, Bacteria ACINETOBACTER BAUMANNII  Final      Susceptibility   Acinetobacter baumannii - MIC*    CEFTAZIDIME 16 INTERMEDIATE Intermediate      CEFTRIAXONE >=64 RESISTANT Resistant     CIPROFLOXACIN >=4 RESISTANT Resistant     GENTAMICIN >=16 RESISTANT Resistant     IMIPENEM 8 INTERMEDIATE Intermediate     PIP/TAZO >=128 RESISTANT Resistant     TRIMETH/SULFA >=320 RESISTANT Resistant     CEFEPIME 16 INTERMEDIATE Intermediate     AMPICILLIN/SULBACTAM 16 INTERMEDIATE Intermediate     * ACINETOBACTER BAUMANNII  Blood Culture ID Panel (Reflexed)     Status: Abnormal   Collection Time: 05/16/19 10:01 AM  Result Value Ref Range Status   Enterococcus species NOT DETECTED NOT DETECTED Final   Listeria monocytogenes NOT DETECTED NOT DETECTED Final   Staphylococcus species NOT DETECTED NOT DETECTED Final   Staphylococcus aureus (BCID) NOT DETECTED NOT DETECTED Final   Streptococcus species NOT DETECTED NOT DETECTED Final   Streptococcus agalactiae NOT DETECTED NOT DETECTED Final   Streptococcus pneumoniae NOT DETECTED NOT DETECTED Final   Streptococcus pyogenes NOT DETECTED NOT DETECTED Final   Acinetobacter baumannii DETECTED (A) NOT DETECTED Final    Comment: CRITICAL RESULT CALLED TO, READ BACK BY AND VERIFIED WITH: RN PEACH SENNCH [id number] AT 715 AM BY CM    Enterobacteriaceae species NOT DETECTED NOT DETECTED Final   Enterobacter cloacae complex NOT DETECTED NOT DETECTED Final   Escherichia coli NOT DETECTED NOT DETECTED Final   Klebsiella oxytoca NOT DETECTED NOT DETECTED Final   Klebsiella pneumoniae NOT DETECTED NOT DETECTED Final   Proteus species NOT DETECTED NOT DETECTED Final   Serratia marcescens NOT DETECTED NOT DETECTED Final   Carbapenem resistance NOT DETECTED NOT DETECTED Final   Haemophilus influenzae NOT DETECTED NOT DETECTED Final   Neisseria meningitidis NOT DETECTED NOT DETECTED Final   Pseudomonas aeruginosa NOT DETECTED NOT DETECTED Final   Candida albicans NOT DETECTED NOT DETECTED Final   Candida glabrata NOT DETECTED NOT DETECTED Final   Candida krusei NOT DETECTED NOT DETECTED Final   Candida  parapsilosis NOT DETECTED NOT DETECTED Final   Candida tropicalis NOT DETECTED NOT DETECTED Final    Comment: Performed at [facility], [place] [address] [place], [place] [address]  Respiratory Panel by RT PCR (Flu A&B, Covid) - Nasopharyngeal Swab     Status: None   Collection Time: 05/16/19  4:19 PM   Specimen: Nasopharyngeal Swab  Result Value Ref Range Status   SARS Coronavirus 2 by RT PCR NEGATIVE NEGATIVE Final    Comment: (NOTE) SARS-CoV-2 target nucleic acids are NOT DETECTED. The SARS-CoV-2 RNA is generally detectable in upper respiratoy specimens during the acute phase of infection. The lowest concentration of SARS-CoV-2 viral copies this assay can detect is 131 copies/mL. A negative result does not preclude SARS-Cov-2 infection and should not be used as the sole basis for treatment or other patient management decisions. A negative result may occur with  improper specimen collection/handling, submission of specimen other than nasopharyngeal swab, presence of viral mutation(s) within the areas targeted by this assay, and inadequate number of viral copies (<131 copies/mL). A negative result must be combined with clinical observations, patient history, and epidemiological information. The expected result is Negative. Fact Sheet for Patients:  PinkCheek.be Fact Sheet for Healthcare Providers:  GravelBags.it This test is not yet ap proved or cleared by the [place] FDA and  has been authorized for detection and/or diagnosis of SARS-CoV-2 by FDA under an Emergency Use Authorization (EUA). This EUA will remain  in effect (meaning this test can be used) for the duration of the COVID-19 declaration under Section 564(b)(1) of the Act, 21 U.S.C. section 360bbb-3(b)(1), unless the authorization is terminated or revoked sooner.    Influenza A by PCR NEGATIVE NEGATIVE Final   Influenza B by PCR NEGATIVE NEGATIVE Final      Comment: (NOTE) The Xpert Xpress SARS-CoV-2/FLU/RSV assay is intended as an aid in  the diagnosis of influenza from Nasopharyngeal swab specimens and  should not be used as a sole basis for treatment. Nasal washings and  aspirates are unacceptable for Xpert Xpress SARS-CoV-2/FLU/RSV  testing. Fact Sheet for Patients: PinkCheek.be Fact Sheet for Healthcare Providers: GravelBags.it This test is not yet approved or cleared by the [place] FDA and  has been authorized for detection and/or diagnosis of SARS-CoV-2 by  FDA under an Emergency Use Authorization (EUA). This EUA will remain  in effect (meaning this test can be used) for the duration of the  Covid-19 declaration under Section 564(b)(1) of the Act, 21  U.S.C. section 360bbb-3(b)(1), unless the authorization is  terminated or revoked. Performed at [facility], [address] [place], [place] [address]      Studies: No results found.   Scheduled Meds: . aspirin EC  81 mg Oral Q breakfast  . atorvastatin  80 mg Oral QPM  . enoxaparin (LOVENOX) injection  40 mg Subcutaneous Q24H  . multivitamin-lutein  1 capsule Oral Daily  . pantoprazole  40 mg Oral Daily  . saccharomyces boulardii  250 mg Oral BID   Continuous Infusions: . sodium chloride 10 mL/hr at 05/18/19 1337  . meropenem (MERREM) IV 1 g (05/19/19 5009)    Principal Problem:   Sepsis due to cellulitis [facility]) Active Problems:   Essential hypertension   COPD (chronic obstructive pulmonary disease) (HCC)   GERD (gastroesophageal reflux disease)   Wound infection   CAD (coronary artery disease)-- [place] IN 06/2017   Tobacco abuse   Traumatic open wound of right lower leg with infection   AKI (acute kidney injury) (HCC)   Hypokalemia   Hypotension   CHF (congestive heart failure) (HCC)   Dehydration   Generalized  weakness   Fall at home   Gait instability   Lactic acidosis   Lung cancer  ([place])   Time spent:   Irwin Brakeman, MD [facility] 05/19/2019, 12:39 PM    LOS: 3 days  How to contact the [facility] Attending or Consulting provider [place] or covering provider during after hours [place], for this patient?  1. Check the care team in [facility] and look for a) attending/consulting [facility] provider listed and b) the [facility] team listed 2. Log into [email or web address] and use [place] universal password to access. If you do not have the password, please contact the hospital operator. 3. Locate the [facility] provider you are looking for under [facility] and page to a number that you can be directly reached. 4. If you still have difficulty reaching the provider, please page the [facility] (Director on Call) for the Hospitalists listed on amion for assistance.

## 2019-05-19 NOTE — Progress Notes (Signed)
Spoke with hospitalist regarding d/c- Could use doripenem (or merrem) as this organism is intermediate.  Other option would be tigacycline however this has significant GI side effects.

## 2019-05-19 NOTE — Progress Notes (Signed)
[facility]  Multidrug resistant acinetobacter in blood. Getting IV antibiotics at home. Picc pending. Wounds redressed today, granulation, no drainage, no erythema on leg, less edema.   Continue BID dressing changes. Wound beds are looking healthy.  Can keep my previous follow up for wound.   Future Appointments  Date Time Provider [place]  06/06/2019  1:00 PM Virl Cagey, MD [facility] None  06/25/2019  8:35 AM Loman Brooklyn, [place] [facility] None   [name], MD [facility] [address] [place], [place] [address] [phone]/ [phone] (office)

## 2019-05-19 NOTE — Progress Notes (Signed)
Spoke with Kennon Portela re PICC order.  Pt not to be d/c home today.  Will plan on IV Team placement Monday.

## 2019-05-20 ENCOUNTER — Ambulatory Visit (INDEPENDENT_AMBULATORY_CARE_PROVIDER_SITE_OTHER): Payer: Medicare Other

## 2019-05-20 DIAGNOSIS — Z720 Tobacco use: Secondary | ICD-10-CM

## 2019-05-20 DIAGNOSIS — Z1624 Resistance to multiple antibiotics: Secondary | ICD-10-CM

## 2019-05-20 DIAGNOSIS — T148XXA Other injury of unspecified body region, initial encounter: Secondary | ICD-10-CM

## 2019-05-20 DIAGNOSIS — L089 Local infection of the skin and subcutaneous tissue, unspecified: Secondary | ICD-10-CM

## 2019-05-20 DIAGNOSIS — A499 Bacterial infection, unspecified: Secondary | ICD-10-CM

## 2019-05-20 LAB — COMPREHENSIVE METABOLIC PANEL
ALT: 43 U/L (ref 0–44)
AST: 34 U/L (ref 15–41)
Albumin: 2.6 g/dL — ABNORMAL LOW (ref 3.5–5.0)
Alkaline Phosphatase: 63 U/L (ref 38–126)
Anion gap: 10 (ref 5–15)
BUN: 11 mg/dL (ref 8–23)
CO2: 30 mmol/L (ref 22–32)
Calcium: 9 mg/dL (ref 8.9–10.3)
Chloride: 100 mmol/L (ref 98–111)
Creatinine, Ser: 0.71 mg/dL (ref 0.61–1.24)
GFR calc Af Amer: >60 mL/min (ref >60)
GFR calc non Af Amer: >60 mL/min (ref >60)
Glucose, Bld: 100 mg/dL — ABNORMAL HIGH (ref 70–99)
Potassium: 4.2 mmol/L (ref 3.5–5.1)
Sodium: 140 mmol/L (ref 135–145)
Total Bilirubin: 0.7 mg/dL (ref 0.3–1.2)
Total Protein: 5.6 g/dL — ABNORMAL LOW (ref 6.5–8.1)

## 2019-05-20 LAB — CBC WITH DIFFERENTIAL/PLATELET
Abs Immature Granulocytes: 0.02 K/uL (ref 0.00–0.07)
Basophils Absolute: 0 K/uL (ref 0.0–0.1)
Basophils Relative: 0 %
Eosinophils Absolute: 0.2 K/uL (ref 0.0–0.5)
Eosinophils Relative: 4 %
HCT: 35.5 % — ABNORMAL LOW (ref 39.0–52.0)
Hemoglobin: 10.5 g/dL — ABNORMAL LOW (ref 13.0–17.0)
Immature Granulocytes: 0 %
Lymphocytes Relative: 13 %
Lymphs Abs: 0.7 K/uL (ref 0.7–4.0)
MCH: 26 pg (ref 26.0–34.0)
MCHC: 29.6 g/dL — ABNORMAL LOW (ref 30.0–36.0)
MCV: 87.9 fL (ref 80.0–100.0)
Monocytes Absolute: 0.5 K/uL (ref 0.1–1.0)
Monocytes Relative: 9 %
Neutro Abs: 3.6 K/uL (ref 1.7–7.7)
Neutrophils Relative %: 74 %
Platelets: 228 K/uL (ref 150–400)
RBC: 4.04 MIL/uL — ABNORMAL LOW (ref 4.22–5.81)
RDW: 15.2 % (ref 11.5–15.5)
WBC: 5 K/uL (ref 4.0–10.5)
nRBC: 0 % (ref 0.0–0.2)

## 2019-05-20 MED ORDER — SACCHAROMYCES BOULARDII 250 MG PO CAPS: 250.0000 mg | ORAL_CAPSULE | Freq: Two times a day (BID) | ORAL | 0 refills | Status: AC

## 2019-05-20 MED ORDER — MEROPENEM IV (FOR PTA / DISCHARGE USE ONLY): 1.0000 g | Freq: Three times a day (TID) | INTRAVENOUS | 0 refills | Status: AC

## 2019-05-20 MED ORDER — CHLORHEXIDINE GLUCONATE CLOTH 2 % EX PADS: 6.0000 | MEDICATED_PAD | Freq: Every day | CUTANEOUS | Status: DC

## 2019-05-20 MED ORDER — SODIUM CHLORIDE 0.9% FLUSH: 10.0000 mL | Freq: Two times a day (BID) | INTRAVENOUS | Status: DC

## 2019-05-20 MED ORDER — SODIUM CHLORIDE 0.9% FLUSH: 10.0000 mL | INTRAVENOUS | Status: DC | PRN

## 2019-05-20 NOTE — Plan of Care (Signed)
  Problem: Education: Goal: Knowledge of General Education information will improve Description: Including pain rating scale, medication(s)/side effects and non-pharmacologic comfort measures Outcome: Adequate for Discharge   Problem: Health Behavior/Discharge Planning: Goal: Ability to manage health-related needs will improve Outcome: Adequate for Discharge   Problem: Activity: Goal: Risk for activity intolerance will decrease Outcome: Adequate for Discharge   Problem: Nutrition: Goal: Adequate nutrition will be maintained Outcome: Adequate for Discharge   Problem: Safety: Goal: Ability to remain free from injury will improve Outcome: Adequate for Discharge   Problem: Skin Integrity: Goal: Risk for impaired skin integrity will decrease Outcome: Adequate for Discharge

## 2019-05-20 NOTE — Progress Notes (Signed)
Peripherally Inserted Central Catheter/Midline Placement  The IV Nurse has discussed with the patient and/or persons authorized to consent for the patient, the purpose of this procedure and the potential benefits and risks involved with this procedure.  The benefits include less needle sticks, lab draws from the catheter, and the patient may be discharged home with the catheter. Risks include, but not limited to, infection, bleeding, blood clot (thrombus formation), and puncture of an artery; nerve damage and irregular heartbeat and possibility to perform a PICC exchange if needed/ordered by physician.  Alternatives to this procedure were also discussed.  Bard Power PICC patient education guide, fact sheet on infection prevention and patient information card has been provided to patient /or left at bedside.    PICC/Midline Placement Documentation  PICC Single Lumen 49/75/30 PICC Right Basilic 42 cm 0 cm (Active)  Indication for Insertion or Continuance of Line Home intravenous therapies (PICC only) 05/20/19 1130  Exposed Catheter (cm) 0 cm 05/20/19 1130  Site Assessment Clean;Dry;Intact 05/20/19 1130  Line Status Flushed;Saline locked;Blood return noted 05/20/19 1130  Dressing Type Transparent;Securing device 05/20/19 1130  Dressing Status Clean;Dry;Intact;Antimicrobial disc in place 05/20/19 [place] checked and tightened 05/20/19 1130  Dressing Intervention New dressing 05/20/19 1130  Dressing Change Due 05/27/19 05/20/19 1130   Patient gave written consent    Virgilio Belling 05/20/2019, 12:30 PM

## 2019-05-20 NOTE — Discharge Summary (Addendum)
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 duration of the COVID-19 declaration under Section 564(b)(1) of the Act, 21 U.S.C. section 360bbb-3(b)(1), unless the authorization is terminated or revoked sooner.    Influenza A by PCR NEGATIVE NEGATIVE Final   Influenza B by PCR NEGATIVE NEGATIVE Final    Comment: (NOTE) The Xpert Xpress SARS-CoV-2/FLU/RSV assay is intended as an aid in  the diagnosis of influenza from Nasopharyngeal swab specimens and  should not be used as a sole basis for treatment. Nasal washings and  aspirates are unacceptable for Xpert Xpress SARS-CoV-2/FLU/RSV  testing. Fact Sheet for Patients: PinkCheek.be Fact Sheet for Healthcare Providers: GravelBags.it This test is  not yet approved or cleared by the [place] FDA and  has been authorized for detection and/or diagnosis of SARS-CoV-2 by  FDA under an Emergency Use Authorization (EUA). This EUA will remain  in effect (meaning this test can be used) for the duration of the  Covid-19 declaration under Section 564(b)(1) of the Act, 21  U.S.C. section 360bbb-3(b)(1), unless the authorization is  terminated or revoked. Performed at [facility], [address] [place], [place] [address]      Labs: BNP (last 3 results) No results for input(s): BNP in the last 8760 hours. Basic Metabolic Panel: Recent Labs  Lab 05/16/19 0958 05/16/19 1239 05/17/19 0601 05/18/19 0601 05/19/19 0616 05/20/19 0619  NA 134*  --  138 140 140 140  K 3.3*  --  4.3 4.1 4.1 4.2  CL 99  --  105 104 104 100  CO2 23  --  26 29 30 30   GLUCOSE 142*  --  99 112* 107* 100*  BUN 26*  --  20 16 13 11   CREATININE 1.36*  --  0.80 0.73 0.64 0.71  CALCIUM 8.9  --  8.6* 8.7* 9.1 9.0  MG  --  2.1 2.2 2.2 2.2  --    Liver Function Tests: Recent Labs  Lab 05/16/19 0958 05/17/19 0601 05/18/19 0601 05/19/19 0616 05/20/19 0619  AST 15 31 43* 37 34  ALT 9 14 32 38 43  ALKPHOS 62 54 54 57 63  BILITOT 0.6 0.6 0.2* 0.5 0.7  PROT 6.2* 5.5* 5.4* 5.6* 5.6*  ALBUMIN 3.2* 2.6* 2.5* 2.5* 2.6*   No results for input(s): LIPASE, AMYLASE in the last 168 hours. No results for input(s): AMMONIA in the last 168 hours. CBC: Recent Labs  Lab 05/16/19 0958 05/17/19 0601 05/18/19 0601 05/19/19 0616 05/20/19 0619  WBC 9.6 7.5 4.9 4.4 5.0  NEUTROABS 8.5* 6.2 3.5 3.0 3.6  HGB 10.7* 9.5* 9.5* 10.0* 10.5*  HCT 34.9* 31.7* 32.1* 33.5* 35.5*  MCV 86.6 87.6 87.9 87.5 87.9  PLT 176 177 196 228 228   Cardiac Enzymes: No results for input(s): CKTOTAL, CKMB, CKMBINDEX, TROPONINI in the last 168 hours. BNP: Invalid input(s): POCBNP CBG: No results for input(s): GLUCAP in the last 168 hours. D-Dimer No results for input(s): DDIMER in the  last 72 hours. Hgb A1c No results for input(s): HGBA1C in the last 72 hours. Lipid Profile No results for input(s): CHOL, HDL, LDLCALC, TRIG, CHOLHDL,  LDLDIRECT in the last 72 hours. Thyroid function studies No results for input(s): TSH, T4TOTAL, T3FREE, THYROIDAB in the last 72 hours.  Invalid input(s): FREET3 Anemia work up No results for input(s): VITAMINB12, FOLATE, FERRITIN, TIBC, IRON, RETICCTPCT in the last 72 hours. Urinalysis    Component Value Date/Time   COLORURINE YELLOW 05/16/2019 0946   APPEARANCEUR CLEAR 05/16/2019 0946   LABSPEC 1.008 05/16/2019 0946   PHURINE 5.0 05/16/2019 0946   GLUCOSEU NEGATIVE 05/16/2019 0946   HGBUR MODERATE (A) 05/16/2019 0946   BILIRUBINUR NEGATIVE 05/16/2019 0946   KETONESUR NEGATIVE 05/16/2019 0946   PROTEINUR NEGATIVE 05/16/2019 0946   NITRITE NEGATIVE 05/16/2019 0946   LEUKOCYTESUR NEGATIVE 05/16/2019 0946   Sepsis Labs Invalid input(s): PROCALCITONIN,  WBC,  LACTICIDVEN Microbiology Recent Results (from the past 240 hour(s))  Urine culture     Status: None   Collection Time: 05/16/19  9:46 AM   Specimen: In/Out Cath Urine  Result Value Ref Range Status   Specimen Description   Final    IN/OUT CATH URINE Performed at [facility], [address] [place], [place] [address]    Special Requests   Final    NONE Performed at [facility], [address] [place], [place] [address]    Culture   Final    NO GROWTH Performed at [facility], [place] [address], [place], [place] [address]    Report Status 05/17/2019 FINAL  Final  Blood Culture (routine x 2)     Status: Abnormal   Collection Time: 05/16/19 10:00 AM   Specimen: BLOOD LEFT ARM  Result Value Ref Range Status   Specimen Description   Final    BLOOD LEFT ARM Performed at [facility], [address], [place], [place] [address]    Special Requests   Final    BOTTLES DRAWN AEROBIC AND ANAEROBIC Blood Culture results may not be optimal due to an excessive volume of  blood received in culture bottles Performed at [place] [place], [place] [address]    Culture  Setup Time   Final    ANAEROBIC BOTTLE ONLY Gram Stain Report Called to,Read Back By and Verified With: PEACH,RN '@0046'$  05/17/19 MKELLY CRITICAL RESULT CALLED TO, READ BACK BY AND VERIFIED WITH: RN P SENNHC [id number] AT 715 BY CM CORRECTED RESULTS GRAM NEGATIVE RODS PREVIOUSLY REPORTED AS: GRAM POSITIVE COCCI CORRECTED RESULTS CALLED TO: RN P SENNHC [id number] T 715 BY CM    Culture (A)  Final    ACINETOBACTER BAUMANNII SUSCEPTIBILITIES PERFORMED ON PREVIOUS CULTURE WITHIN THE LAST 5 DAYS. Performed at [facility], [place] [address], [place], [place] [address]    Report Status 05/19/2019 FINAL  Final  Blood Culture (routine x 2)     Status: Abnormal   Collection Time: 05/16/19 10:01 AM   Specimen: BLOOD RIGHT ARM  Result Value Ref Range Status   Specimen Description   Final    BLOOD RIGHT ARM Performed at [facility], [address] [place], [place] [address]    Special Requests   Final    BOTTLES DRAWN AEROBIC AND ANAEROBIC Blood Culture results may not be optimal due to an excessive volume of blood received in culture bottles Performed at [facility], [address], [place], [place] [address]    Culture  Setup Time   Final    ANAEROBIC BOTTLE ONLY Gram Stain Report Called to,Read Back By and Verified With: PEACH,RN '@0047'$  05/17/19 MKELLY CRITICAL RESULT CALLED TO, READ BACK BY  AND VERIFIED WITH: RN P SENNHC [id number] AT 717 AM BY CM CORRECTED RESULTS GRAM NEGATIVE RODS PREVIOUSLY REPORTED AS: GRAM POSITIVE COCCI CORRECTED RESULTS CALLED TO: RN PSENNHC [id number] AT 717 AM BY CM Performed at [facility], [place] [address] [place], [place] [address]    Culture (A)  Final    ACINETOBACTER BAUMANNII MULTI-DRUG RESISTANT ORGANISM    Report Status 05/19/2019 FINAL  Final   Organism ID, Bacteria ACINETOBACTER BAUMANNII  Final      Susceptibility   Acinetobacter  baumannii - MIC*    CEFTAZIDIME 16 INTERMEDIATE Intermediate     CEFTRIAXONE >=64 RESISTANT Resistant     CIPROFLOXACIN >=4 RESISTANT Resistant     GENTAMICIN >=16 RESISTANT Resistant     IMIPENEM 8 INTERMEDIATE Intermediate     PIP/TAZO >=128 RESISTANT Resistant     TRIMETH/SULFA >=320 RESISTANT Resistant     CEFEPIME 16 INTERMEDIATE Intermediate     AMPICILLIN/SULBACTAM 16 INTERMEDIATE Intermediate     * ACINETOBACTER BAUMANNII  Blood Culture ID Panel (Reflexed)     Status: Abnormal   Collection Time: 05/16/19 10:01 AM  Result Value Ref Range Status   Enterococcus species NOT DETECTED NOT DETECTED Final   Listeria monocytogenes NOT DETECTED NOT DETECTED Final   Staphylococcus species NOT DETECTED NOT DETECTED Final   Staphylococcus aureus (BCID) NOT DETECTED NOT DETECTED Final   Streptococcus species NOT DETECTED NOT DETECTED Final   Streptococcus agalactiae NOT DETECTED NOT DETECTED Final   Streptococcus pneumoniae NOT DETECTED NOT DETECTED Final   Streptococcus pyogenes NOT DETECTED NOT DETECTED Final   Acinetobacter baumannii DETECTED (A) NOT DETECTED Final    Comment: CRITICAL RESULT CALLED TO, READ BACK BY AND VERIFIED WITH: RN PEACH SENNCH 122520 AT 715 AM BY CM    Enterobacteriaceae species NOT DETECTED NOT DETECTED Final   Enterobacter cloacae complex NOT DETECTED NOT DETECTED Final   Escherichia coli NOT DETECTED NOT DETECTED Final   Klebsiella oxytoca NOT DETECTED NOT DETECTED Final   Klebsiella pneumoniae NOT DETECTED NOT DETECTED Final   Proteus species NOT DETECTED NOT DETECTED Final   Serratia marcescens NOT DETECTED NOT DETECTED Final   Carbapenem resistance NOT DETECTED NOT DETECTED Final   Haemophilus influenzae NOT DETECTED NOT DETECTED Final   Neisseria meningitidis NOT DETECTED NOT DETECTED Final   Pseudomonas aeruginosa NOT DETECTED NOT DETECTED Final   Candida albicans NOT DETECTED NOT DETECTED Final   Candida glabrata NOT DETECTED NOT DETECTED Final    Candida krusei NOT DETECTED NOT DETECTED Final   Candida parapsilosis NOT DETECTED NOT DETECTED Final   Candida tropicalis NOT DETECTED NOT DETECTED Final    Comment: Performed at [facility], [address], [place], [place] [address]  Respiratory Panel by RT PCR (Flu A&B, Covid) - Nasopharyngeal Swab     Status: None   Collection Time: 05/16/19  4:19 PM   Specimen: Nasopharyngeal Swab  Result Value Ref Range Status   SARS Coronavirus 2 by RT PCR NEGATIVE NEGATIVE Final    Comment: (NOTE) SARS-CoV-2 target nucleic acids are NOT DETECTED. The SARS-CoV-2 RNA is generally detectable in upper respiratoy specimens during the acute phase of infection. The lowest concentration of SARS-CoV-2 viral copies this assay can detect is 131 copies/mL. A negative result does not preclude SARS-Cov-2 infection and should not be used as the sole basis for treatment or other patient management decisions. A negative result may occur with  improper specimen collection/handling, submission of specimen other than nasopharyngeal swab, presence of viral  mutation(s) within the areas targeted by this assay, and inadequate number of viral copies (<131 copies/mL). A negative result must be combined with clinical observations, patient history, and epidemiological information. The expected result is Negative. Fact Sheet for Patients:  PinkCheek.be Fact Sheet for Healthcare Providers:  GravelBags.it This test is not yet ap proved or cleared by the [place] FDA and  has been authorized for detection and/or diagnosis of SARS-CoV-2 by FDA under an Emergency Use Authorization (EUA). This EUA will remain  in effect (meaning this test can be used) for the duration of the COVID-19 declaration under Section 564(b)(1) of the Act, 21 U.S.C. section 360bbb-3(b)(1), unless the authorization is terminated or revoked sooner.    Influenza A by PCR NEGATIVE  NEGATIVE Final   Influenza B by PCR NEGATIVE NEGATIVE Final    Comment: (NOTE) The Xpert Xpress SARS-CoV-2/FLU/RSV assay is intended as an aid in  the diagnosis of influenza from Nasopharyngeal swab specimens and  should not be used as a sole basis for treatment. Nasal washings and  aspirates are unacceptable for Xpert Xpress SARS-CoV-2/FLU/RSV  testing. Fact Sheet for Patients: PinkCheek.be Fact Sheet for Healthcare Providers: GravelBags.it This test is not yet approved or cleared by the [place] FDA and  has been authorized for detection and/or diagnosis of SARS-CoV-2 by  FDA under an Emergency Use Authorization (EUA). This EUA will remain  in effect (meaning this test can be used) for the duration of the  Covid-19 declaration under Section 564(b)(1) of the Act, 21  U.S.C. section 360bbb-3(b)(1), unless the authorization is  terminated or revoked. Performed at [facility], [address] [place], [place] [address]     Time coordinating discharge: 32 mins   SIGNED:  Irwin Brakeman, MD  [facility] 05/20/2019, 11:55 AM How to contact the [facility] Attending or Consulting provider [place] or covering provider during after hours [place], for this patient?  1. Check the care team in [facility] and look for a) attending/consulting TR[facility] provider listed and b) the [facility] team listed 2. Log into [email or web address] and use [place]'s universal password to access. If you do not have the password, please contact the hospital operator. 3. Locate the [facility] provider you are looking for under [facility] and page to a number that you can be directly reached. 4. If you still have difficulty reaching the provider, please page the [facility] (Director on Call) for the Hospitalists listed on amion for assistance.

## 2019-05-20 NOTE — Progress Notes (Signed)
PHARMACY CONSULT NOTE FOR:  OUTPATIENT  PARENTERAL ANTIBIOTIC THERAPY (OPAT)  Indication: Bacteremia Regimen: Meropenem 1000 mg IV every 8 hours End date: 05/26/19 is last day of therapy  IV antibiotic discharge orders are pended. To discharging provider:  please sign these orders via discharge navigator,  Select New Orders & click on the button choice - Manage This Unsigned Work.     Thank you for allowing pharmacy to be a part of this patient's care.  Ramond Craver 05/20/2019, 11:20 AM

## 2019-05-20 NOTE — Discharge Instructions (Signed)

## 2019-05-21 ENCOUNTER — Other Ambulatory Visit: Payer: Self-pay

## 2019-05-21 ENCOUNTER — Ambulatory Visit: Payer: Medicare Other | Admitting: General Surgery

## 2019-05-21 ENCOUNTER — Ambulatory Visit (INDEPENDENT_AMBULATORY_CARE_PROVIDER_SITE_OTHER): Payer: Medicare Other

## 2019-05-21 DIAGNOSIS — I2510 Atherosclerotic heart disease of native coronary artery without angina pectoris: Secondary | ICD-10-CM

## 2019-05-21 DIAGNOSIS — K219 Gastro-esophageal reflux disease without esophagitis: Secondary | ICD-10-CM

## 2019-05-21 DIAGNOSIS — Z955 Presence of coronary angioplasty implant and graft: Secondary | ICD-10-CM

## 2019-05-21 DIAGNOSIS — D1621 Benign neoplasm of long bones of right lower limb: Secondary | ICD-10-CM

## 2019-05-21 DIAGNOSIS — S81801D Unspecified open wound, right lower leg, subsequent encounter: Secondary | ICD-10-CM

## 2019-05-21 DIAGNOSIS — W3089XD Contact with other specified agricultural machinery, subsequent encounter: Secondary | ICD-10-CM

## 2019-05-21 DIAGNOSIS — Z85118 Personal history of other malignant neoplasm of bronchus and lung: Secondary | ICD-10-CM

## 2019-05-21 DIAGNOSIS — Z87891 Personal history of nicotine dependence: Secondary | ICD-10-CM

## 2019-05-21 DIAGNOSIS — I5032 Chronic diastolic (congestive) heart failure: Secondary | ICD-10-CM

## 2019-05-21 DIAGNOSIS — Z7982 Long term (current) use of aspirin: Secondary | ICD-10-CM

## 2019-05-21 DIAGNOSIS — B957 Other staphylococcus as the cause of diseases classified elsewhere: Secondary | ICD-10-CM

## 2019-05-21 DIAGNOSIS — J449 Chronic obstructive pulmonary disease, unspecified: Secondary | ICD-10-CM

## 2019-05-21 DIAGNOSIS — I110 Hypertensive heart disease with heart failure: Secondary | ICD-10-CM

## 2019-05-21 DIAGNOSIS — I252 Old myocardial infarction: Secondary | ICD-10-CM

## 2019-05-21 NOTE — TOC Transition Note (Signed)
Transition of Care [facility]) - CM/SW Discharge Note   Patient Details  Name: Paul Snyder MRN: [id number] Date of Birth: 01-Sep-1935  [facility] of [facility] CM/SW Contact:  [place], LCSW Phone Number: 05/21/2019, 11:10 AM   Clinical Narrative:   Pt discharging home on 05/20/2019 with IV antibiotics from [facility] and HH/PT/OT needs from [facility].   CSW in contact with Dawn, patients daughter to confirm that there would be support available to the patient while at home with administering the mediation. Dawn confirmed that she and her brother stay within miles of the patient and would be available to provide much help as needed.   No further needs at this time.   [place] Transitions of Care  Clinical Social Worker  Ph: [phone]   Final next level of care: Home w Home Health Services Barriers to Discharge: No Barriers Identified   Patient Goals and CMS Choice Patient states their goals for this hospitalization and ongoing recovery are:: to discharge home with [facility] and supervision from adult children CMS Medicare.gov Compare Post Acute Care list provided to:: Patient Choice offered to / list presented to : Patient  Discharge Placement                       Discharge Plan and Services In-house Referral: Clinical Social Work   Post Acute Care Choice: Home Health          DME Arranged: IV pump/equipment         HH Arranged: RN, PT, IV Antibiotics HH Agency: [facility] ([facility]) Date [place]: 05/21/19 Time HH Agency Contacted: 1100 Representative spoke with at [place]: [place] ([place]) Interventions     Readmission Risk Interventions No flowsheet data found.

## 2019-05-27 ENCOUNTER — Telehealth: Payer: Self-pay | Admitting: Family Medicine

## 2019-05-27 MED ORDER — FUROSEMIDE 20 MG PO TABS: 20.0000 mg | ORAL_TABLET | Freq: Every morning | ORAL | 0 refills | Status: DC

## 2019-05-27 NOTE — Telephone Encounter (Signed)
Pts daughter said she called [facility] about pt's medications and was told that they would resend Rx refill requests to [place].

## 2019-05-27 NOTE — Telephone Encounter (Signed)
rx sent in for 30 days and pt aware.

## 2019-05-27 NOTE — Telephone Encounter (Signed)
Pt has been taking Furosemide 20mg  daily but we have never given it to him. It was rx'd by Dr Edrick Oh per pt's daughter and he only has one pill left. He does have hospital follow up appt with you on 06/11/19. Can we send in furosemide 20mg  for him till his appt? Please advise?

## 2019-05-27 NOTE — Telephone Encounter (Signed)
Pt's daughter called stating that [facility] was supposed to fax [place] pt's rx's that Dr Edrick Oh had previously prescribed him so that they could be refilled. Says pt will run out of his medication tomorrow for the FUROSEMIDE 20mg . Says [facility] told her that they sent everything to [place] on 05/23/2019.

## 2019-05-27 NOTE — Telephone Encounter (Signed)
Ok to continue, last renal function in December looked good. Can do 30 day supply.

## 2019-05-29 ENCOUNTER — Telehealth: Payer: Self-pay | Admitting: Family Medicine

## 2019-05-29 NOTE — Telephone Encounter (Signed)
I have received labs that were drawn from home health which reveal an elevated potassium of 5.7 and a note that states it may be hemolyzed.  Home health is supposed to go back out on 05/30/2019.  Please have them draw CMP while they are out.

## 2019-05-30 NOTE — Telephone Encounter (Signed)
[place] nurse w/ [facility] aware

## 2019-06-05 ENCOUNTER — Other Ambulatory Visit: Payer: Self-pay

## 2019-06-05 ENCOUNTER — Other Ambulatory Visit: Payer: Self-pay | Admitting: Family Medicine

## 2019-06-05 ENCOUNTER — Other Ambulatory Visit: Payer: Medicare Other

## 2019-06-05 DIAGNOSIS — E875 Hyperkalemia: Secondary | ICD-10-CM

## 2019-06-05 DIAGNOSIS — D649 Anemia, unspecified: Secondary | ICD-10-CM

## 2019-06-06 ENCOUNTER — Ambulatory Visit (INDEPENDENT_AMBULATORY_CARE_PROVIDER_SITE_OTHER): Payer: Medicare Other | Admitting: General Surgery

## 2019-06-06 ENCOUNTER — Encounter: Payer: Self-pay | Admitting: General Surgery

## 2019-06-06 VITALS — BP 145/70 | HR 63 | Temp 97.9°F | Resp 16 | Ht 72.0 in | Wt 163.0 lb

## 2019-06-06 DIAGNOSIS — S81801D Unspecified open wound, right lower leg, subsequent encounter: Secondary | ICD-10-CM

## 2019-06-06 DIAGNOSIS — S8011XS Contusion of right lower leg, sequela: Secondary | ICD-10-CM

## 2019-06-06 DIAGNOSIS — L089 Local infection of the skin and subcutaneous tissue, unspecified: Secondary | ICD-10-CM

## 2019-06-06 LAB — BASIC METABOLIC PANEL
BUN/Creatinine Ratio: 10 (ref 10–24)
BUN: 7 mg/dL — ABNORMAL LOW (ref 8–27)
CO2: 29 mmol/L (ref 20–29)
Calcium: 9.2 mg/dL (ref 8.6–10.2)
Chloride: 100 mmol/L (ref 96–106)
Creatinine, Ser: 0.71 mg/dL — ABNORMAL LOW (ref 0.76–1.27)
GFR calc Af Amer: 102 mL/min/{1.73_m2} (ref >59)
GFR calc non Af Amer: 88 mL/min/{1.73_m2} (ref >59)
Glucose: 94 mg/dL (ref 65–99)
Potassium: 4.3 mmol/L (ref 3.5–5.2)
Sodium: 139 mmol/L (ref 134–144)

## 2019-06-06 LAB — CBC
Hematocrit: 36.4 % — ABNORMAL LOW (ref 37.5–51.0)
Hemoglobin: 11.5 g/dL — ABNORMAL LOW (ref 13.0–17.7)
MCH: 26 pg — ABNORMAL LOW (ref 26.6–33.0)
MCHC: 31.6 g/dL (ref 31.5–35.7)
MCV: 82 fL (ref 79–97)
Platelets: 294 10*3/uL (ref 150–450)
RBC: 4.43 x10E6/uL (ref 4.14–5.80)
RDW: 15.1 % (ref 11.6–15.4)
WBC: 8.1 10*3/uL (ref 3.4–10.8)

## 2019-06-06 NOTE — Patient Instructions (Signed)
Continue wet to dry daily for about 1 more week then can go to just dry dressing to catch any drainage.  Can start placing neosporin to area once during dry dressing.

## 2019-06-06 NOTE — Progress Notes (Signed)
[facility] Note   HPI:  84 y.o. Male presents to clinic for follow-up evaluation of his left thigh and leg wounds. They have gone down to packing once a day. Still with weakness. Completed antibiotics for bacteremia.   Review of Systems:  Weakness in legs Minimal drainage from wounds All other review of systems: otherwise negative   Vital Signs:  BP (!) 145/70 (BP Location: Left Arm, Patient Position: Sitting, Cuff Size: Normal)   Pulse 63   Temp 97.9 F (36.6 C) (Oral)   Resp 16   Ht 6' (1.829 m)   Wt 163 lb (73.9 kg)   SpO2 (!) 89%   BMI 22.11 kg/m    Physical Exam:  Physical Exam Vitals reviewed.  HENT:     Head: Normocephalic.  Cardiovascular:     Rate and Rhythm: Normal rate.  Pulmonary:     Effort: Pulmonary effort is normal.  Musculoskeletal:     Comments: Left thigh wound almost completely healed, 2cmX-0.5cm area of granulation remaining, minimal drainage, remainder of wound with epithelization, hematoma inferior softening, no erythema; lower left leg wound with small < 1cm in size and < 0.5cm deep wound with granulation, minimal drainage, no erythema, mild lower extremity edema   Neurological:     General: No focal deficit present.     Mental Status: He is alert and oriented to person, place, and time.       Assessment:  84 y.o. yo Male with healing left leg wounds s/p trauma. Doing well overall. He did have bacteremia potentially from the wounds a few weeks ago but has completed his antibiotics.    Plan:  Continue wet to dry daily for about 1 more week then can go to just dry dressing to catch any drainage.  Can start placing neosporin to area once during dry dressing.  Future Appointments  Date Time Provider [place]  06/11/2019 11:05 AM Loman [place], FNP [facility] None  06/25/2019  8:35 AM Loman [place], FNP [facility] None  07/04/2019  1:15 PM Virl Cagey, MD [facility] None    All of the above recommendations were  discussed with the patient and patient's family, and all of patient's and family's questions were answered to their expressed satisfaction.  [name], MD [facility] [address] [place], [place] [address] [phone] (office)

## 2019-06-08 NOTE — Progress Notes (Signed)
Assessment & Plan:  1. Sepsis due to cellulitis ([place]) - Resolved. Patient has completed IV antibiotics and his PICC line has been removed. He has had his follow-up with Dr. Constance Haw.   2. Hypokalemia - Resolved.  3. AKI (acute kidney injury) ([place]) - Resolved.   4. Chronic congestive heart failure, unspecified heart failure type ([place]) - Well controlled on current regimen.  - furosemide (LASIX) 20 MG tablet; Take 1 tablet (20 mg total) by mouth every morning.  Dispense: 90 tablet; Refill: 1  5. Essential hypertension - Well controlled with no medications.  6. Chronic obstructive pulmonary disease, unspecified COPD type ([place]) - Well controlled on current regimen.   7. High priority for COVID-19 virus vaccination - Information provided on where to schedule appointment for COVID-19 vaccination. Patient believes he will wait until we are able to offer vaccine in the office as he does not wish to wait for hours at the health department.   Return in about 6 months (around 12/09/2019) for annual physical.  Hendricks Limes, MSN, APRN, FNP-C [facility]  Subjective:    Patient ID: Paul Snyder, male    DOB: [birth year], 84 y.o.   MRN: [id number]  Patient Care Team: Loman Brooklyn, FNP as PCP - General (Family Medicine) [name] Alphonse Guild, MD as Consulting Physician (Cardiology)   Chief Complaint:  Chief Complaint  Patient presents with  . Hospitalization Follow-up    HPI: Paul Snyder is a 84 y.o. male presenting on 06/11/2019 for Hospitalization Follow-up  Patient here for a hospital follow-up. He was admitted to [facility] from 05/16/2019 - 05/20/2019 due to cellulitis of the right leg. He had positive blood cultures. He was discharged with IV antibiotics via PICC line and home health RN and PT. He was advised to follow-up with PCP in 1 week and Dr. Constance Haw for wound care as scheduled. Patient reports he completed the IV antibiotics and his PICC  line was removed by the home health RN. He states the PT came out twice and said he was good and did not need PT. He had his f/u with Dr. Constance Haw on 06/06/2019 at which time his wounds were doing well. Patient was unable to get an appointment with me until today. Patient has had labs multiple times since his discharge either with home health or in the office last week.   COPD: patient uses Albuterol as needed but not more than twice weekly. He reports he has used a long acting inhaler in the past and does not feel he needs one.   New complaints: None  Social history:  Relevant past medical, surgical, family and social history reviewed and updated as indicated. Interim medical history since our last visit reviewed.  Allergies and medications reviewed and updated.  DATA REVIEWED: CHART IN EPIC  ROS: Negative unless specifically indicated above in HPI.    Current Outpatient Medications:  .  acetaminophen (TYLENOL) 325 MG tablet, Take 2 tablets (650 mg total) by mouth every 6 (six) hours as needed for mild pain (or Fever >/= 101)., Disp: 12 tablet, Rfl: 0 .  albuterol (VENTOLIN HFA) 108 (90 Base) MCG/ACT inhaler, Inhale 2 puffs into the lungs every 4 (four) hours as needed for wheezing or shortness of breath., Disp: 18 g, Rfl: 1 .  aspirin 81 MG EC tablet, Take 1 tablet (81 mg total) by mouth daily with breakfast., Disp: 30 tablet, Rfl: 12 .  atorvastatin (LIPITOR) 80 MG tablet, Take 1 tablet (80  mg total) by mouth every evening., Disp: 90 tablet, Rfl: 1 .  furosemide (LASIX) 20 MG tablet, Take 1 tablet (20 mg total) by mouth every morning., Disp: 90 tablet, Rfl: 1 .  meclizine (ANTIVERT) 12.5 MG tablet, Take 12.5 mg by mouth daily as needed for dizziness. , Disp: , Rfl: 1 .  multivitamin-lutein (OCUVITE-LUTEIN) CAPS capsule, Take 1 capsule by mouth daily., Disp: 30 capsule, Rfl: 1 .  niacin 500 MG tablet, Take 500 mg by mouth every morning. , Disp: , Rfl:  .  pantoprazole (PROTONIX) 40 MG  tablet, Take 1 tablet (40 mg total) by mouth daily. (Patient taking differently: Take 40 mg by mouth every morning. ), Disp: 90 tablet, Rfl: 1   No Known Allergies Past Medical History:  Diagnosis Date  . CHF (congestive heart failure) ([place])   . COPD (chronic obstructive pulmonary disease) ([place])   . GERD (gastroesophageal reflux disease)   . Lung cancer ([place]) 2009  . NSTEMI (non-ST elevated myocardial infarction) ([place]) 07/16/2017   2/19 PCI/DES x1 to Lcx with moderate (50%) in the RCA, mild (20%) in LAD    Past Surgical History:  Procedure Laterality Date  . ANKLE FRACTURE SURGERY Right 1970s  . CATARACT EXTRACTION Left   . CORONARY ANGIOPLASTY WITH STENT PLACEMENT  07/19/2017  . CORONARY STENT INTERVENTION N/A 07/19/2017   Procedure: CORONARY STENT INTERVENTION;  Surgeon: [name], MD;  Location: [place] [facility];  Service: Cardiovascular;  Laterality: N/A;  . LEFT HEART CATH AND CORONARY ANGIOGRAPHY N/A 07/19/2017   Procedure: LEFT HEART CATH AND CORONARY ANGIOGRAPHY;  Surgeon: [name], MD;  Location: [place] [facility];  Service: Cardiovascular;  Laterality: N/A;  . LUNG BIOPSY      Social History   Socioeconomic History  . Marital status: Widowed    Spouse name: Not on file  . Number of children: Not on file  . Years of education: Not on file  . Highest education level: Not on file  Occupational History  . Not on file  Tobacco Use  . Smoking status: Former Smoker    Packs/day: 1.00    Years: 42.00    Pack years: 42.00    Types: Cigarettes    Quit date: 1996    Years since quitting: 25.0  . Smokeless tobacco: Never Used  Substance and Sexual Activity  . Alcohol use: No  . Drug use: Not Currently  . Sexual activity: Never  Other Topics Concern  . Not on file  Social History Narrative   Lives by himself   Social Determinants of Health   Financial Resource Strain: Low Risk   . Difficulty of Paying Living Expenses: Not hard at all  Food Insecurity:  No Food Insecurity  . Worried About Charity fundraiser in the Last Year: Never true  . Ran Out of Food in the Last Year: Never true  Transportation Needs: No Transportation Needs  . Lack of Transportation (Medical): No  . Lack of Transportation (Non-Medical): No  Physical Activity: Inactive  . Days of Exercise per Week: 0 days  . Minutes of Exercise per Session: 0 min  Stress: No Stress Concern Present  . Feeling of Stress : Not at all  Social Connections: Slightly Isolated  . Frequency of Communication with Friends and Family: More than three times a week  . Frequency of Social Gatherings with Friends and Family: More than three times a week  . Attends Religious Services: More than 4 times per year  .  Active Member of Clubs or Organizations: Yes  . Attends Archivist Meetings: More than 4 times per year  . Marital Status: Widowed  Intimate Partner Violence: Not At Risk  . Fear of Current or Ex-Partner: No  . Emotionally Abused: No  . Physically Abused: No  . Sexually Abused: No        Objective:    BP 130/70   Pulse (!) 57   Temp (!) 96.8 F (36 C) (Temporal)   Ht 5\' 11"  (1.803 m)   Wt 165 lb (74.8 kg)   SpO2 97%   BMI 23.01 kg/m   Physical Exam Vitals reviewed.  Constitutional:      General: He is not in acute distress.    Appearance: Normal appearance. He is normal weight. He is not ill-appearing, toxic-appearing or diaphoretic.  HENT:     Head: Normocephalic and atraumatic.  Eyes:     General: No scleral icterus.       Right eye: No discharge.        Left eye: No discharge.     Conjunctiva/sclera: Conjunctivae normal.  Cardiovascular:     Rate and Rhythm: Normal rate and regular rhythm.     Heart sounds: Normal heart sounds. No murmur. No friction rub. No gallop.   Pulmonary:     Effort: Pulmonary effort is normal. No respiratory distress.     Breath sounds: Normal breath sounds. No stridor. No wheezing, rhonchi or rales.  Musculoskeletal:         General: Normal range of motion.     Cervical back: Normal range of motion.  Skin:    General: Skin is warm and dry.  Neurological:     Mental Status: He is alert and oriented to person, place, and time. Mental status is at baseline.     Gait: Gait abnormal (ambulating with cane).  Psychiatric:        Mood and Affect: Mood normal.        Behavior: Behavior normal.        Thought Content: Thought content normal.        Judgment: Judgment normal.     No results found for: TSH Lab Results  Component Value Date   WBC 8.1 06/05/2019   HGB 11.5 (L) 06/05/2019   HCT 36.4 (L) 06/05/2019   MCV 82 06/05/2019   PLT 294 06/05/2019   Lab Results  Component Value Date   NA 139 06/05/2019   K 4.3 06/05/2019   CO2 29 06/05/2019   GLUCOSE 94 06/05/2019   BUN 7 (L) 06/05/2019   CREATININE 0.71 (L) 06/05/2019   BILITOT 0.7 05/20/2019   ALKPHOS 63 05/20/2019   AST 34 05/20/2019   ALT 43 05/20/2019   PROT 5.6 (L) 05/20/2019   ALBUMIN 2.6 (L) 05/20/2019   CALCIUM 9.2 06/05/2019   ANIONGAP 10 05/20/2019   Lab Results  Component Value Date   CHOL 126 07/20/2017   Lab Results  Component Value Date   HDL 32 (L) 07/20/2017   Lab Results  Component Value Date   LDLCALC 71 07/20/2017   Lab Results  Component Value Date   TRIG 117 07/20/2017   Lab Results  Component Value Date   CHOLHDL 3.9 07/20/2017   Lab Results  Component Value Date   HGBA1C 4.8 05/16/2019

## 2019-06-11 ENCOUNTER — Ambulatory Visit (INDEPENDENT_AMBULATORY_CARE_PROVIDER_SITE_OTHER): Payer: Medicare Other | Admitting: Family Medicine

## 2019-06-11 ENCOUNTER — Encounter: Payer: Self-pay | Admitting: Family Medicine

## 2019-06-11 ENCOUNTER — Other Ambulatory Visit: Payer: Self-pay

## 2019-06-11 VITALS — BP 130/70 | HR 57 | Temp 96.8°F | Ht 71.0 in | Wt 165.0 lb

## 2019-06-11 DIAGNOSIS — E876 Hypokalemia: Secondary | ICD-10-CM | POA: Diagnosis not present

## 2019-06-11 DIAGNOSIS — I10 Essential (primary) hypertension: Secondary | ICD-10-CM

## 2019-06-11 DIAGNOSIS — L0390 Cellulitis, unspecified: Secondary | ICD-10-CM

## 2019-06-11 DIAGNOSIS — N179 Acute kidney failure, unspecified: Secondary | ICD-10-CM | POA: Diagnosis not present

## 2019-06-11 DIAGNOSIS — I509 Heart failure, unspecified: Secondary | ICD-10-CM

## 2019-06-11 DIAGNOSIS — J449 Chronic obstructive pulmonary disease, unspecified: Secondary | ICD-10-CM

## 2019-06-11 DIAGNOSIS — A419 Sepsis, unspecified organism: Secondary | ICD-10-CM

## 2019-06-11 DIAGNOSIS — Z23 Encounter for immunization: Secondary | ICD-10-CM

## 2019-06-11 MED ORDER — FUROSEMIDE 20 MG PO TABS: 20.0000 mg | ORAL_TABLET | Freq: Every morning | ORAL | 1 refills | Status: DC

## 2019-06-11 NOTE — Patient Instructions (Addendum)
Yes appointment for next month can be cancelled.   Another provider handled that refill which is why only 30 tablets were sent. I have refilled with 90 today.     [facility]: Drive-through at [place] on Tuesdays and Thursdays from 10 AM - 2 PM. Look for the big white tent. [address] [place], [place] [address]. Patient must bring consent form already filled out, which is online at [email or web address] [phone]  [place]:  . [place] offering at [facility] 8 AM - 1 PM until 06/09/19, then will move to [facility] on 06/11/19 and [employer] on 06/19/19.  . Public Health offering at the following: (Schedule appt: call [phone] and select option 2) o [facility]: [place], [place] [address] o [facility] at [facility]: [address] [place], [place] [address] o [facility]: [place], [place] [address]  [place]: Hotel manager in [place]. This is a first-come, first-serve drive thru clinic OR call their [place] hotline at [phone]. They soon will offer testing and vaccination at [facility].  [facility]: Call [place]: [facility] [phone], option 7.

## 2019-06-12 ENCOUNTER — Encounter: Payer: Self-pay | Admitting: Family Medicine

## 2019-06-25 ENCOUNTER — Ambulatory Visit: Payer: Medicare Other | Admitting: Family Medicine

## 2019-07-04 ENCOUNTER — Encounter: Payer: Self-pay | Admitting: General Surgery

## 2019-07-04 ENCOUNTER — Ambulatory Visit (INDEPENDENT_AMBULATORY_CARE_PROVIDER_SITE_OTHER): Payer: Self-pay | Admitting: General Surgery

## 2019-07-04 ENCOUNTER — Other Ambulatory Visit: Payer: Self-pay

## 2019-07-04 VITALS — BP 123/59 | HR 65 | Temp 97.4°F | Resp 16 | Ht 72.0 in | Wt 168.0 lb

## 2019-07-04 DIAGNOSIS — S81801D Unspecified open wound, right lower leg, subsequent encounter: Secondary | ICD-10-CM

## 2019-07-04 DIAGNOSIS — L089 Local infection of the skin and subcutaneous tissue, unspecified: Secondary | ICD-10-CM
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
## 2019-07-04 NOTE — Patient Instructions (Signed)
Area should continue to heal and soften over time. Call or go to ED with concern for redness worsening or fever, chills, etc.

## 2019-07-05 ENCOUNTER — Encounter (HOSPITAL_COMMUNITY): Payer: Self-pay | Admitting: Emergency Medicine

## 2019-07-05 ENCOUNTER — Other Ambulatory Visit: Payer: Self-pay

## 2019-07-05 ENCOUNTER — Emergency Department (HOSPITAL_COMMUNITY)
Admission: EM | Admit: 2019-07-05 | Discharge: 2019-07-05 | Disposition: A | Discharge status: Home / Self Care | Payer: Medicare Other | Attending: Emergency Medicine | Admitting: Emergency Medicine

## 2019-07-05 DIAGNOSIS — Z4800 Encounter for change or removal of nonsurgical wound dressing: Secondary | ICD-10-CM | POA: Insufficient documentation

## 2019-07-05 DIAGNOSIS — J449 Chronic obstructive pulmonary disease, unspecified: Secondary | ICD-10-CM | POA: Diagnosis not present

## 2019-07-05 DIAGNOSIS — I2510 Atherosclerotic heart disease of native coronary artery without angina pectoris: Secondary | ICD-10-CM | POA: Diagnosis not present

## 2019-07-05 DIAGNOSIS — Z79899 Other long term (current) drug therapy: Secondary | ICD-10-CM | POA: Diagnosis not present

## 2019-07-05 DIAGNOSIS — Z5189 Encounter for other specified aftercare: Secondary | ICD-10-CM

## 2019-07-05 DIAGNOSIS — Z87891 Personal history of nicotine dependence: Secondary | ICD-10-CM | POA: Diagnosis not present

## 2019-07-05 DIAGNOSIS — I110 Hypertensive heart disease with heart failure: Secondary | ICD-10-CM | POA: Insufficient documentation

## 2019-07-05 DIAGNOSIS — Z7982 Long term (current) use of aspirin: Secondary | ICD-10-CM | POA: Insufficient documentation

## 2019-07-05 DIAGNOSIS — I509 Heart failure, unspecified: Secondary | ICD-10-CM | POA: Diagnosis not present

## 2019-07-05 NOTE — Discharge Instructions (Addendum)
Follow-up on Monday for recheck.  Try to see your doctor but if you cannot be seen by your doctor you can come back here.  If the bleeding becomes severe just return. changed the dressing in 2 days

## 2019-07-05 NOTE — ED Notes (Addendum)
Pt in wheelchair   Dr Earnest Conroy desires for pt to await re-eval for 30 minutes   Pt informed
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
## 2019-07-05 NOTE — ED Notes (Signed)
Pt here for wound check after bleeding noted from wound   Checked yesterday by Dr Constance Haw but started bleeding tonight

## 2019-07-05 NOTE — ED Notes (Signed)
Pt up for discharge, however there are no discharge papers currently

## 2019-07-07 NOTE — ED Provider Notes (Signed)
[facility] EMERGENCY DEPARTMENT Provider Note   CSN: [id number] Arrival date & time: 07/05/19  2053     History Chief Complaint  Patient presents with  . Wound Check    Paul Snyder is a 84 y.o. male.  Patient has no laceration to his right thigh that has been repaired.  It has opened up and is bleeding.  The history is provided by the patient. No language interpreter was used.  Wound Check This is a recurrent problem. The current episode started 1 to 2 hours ago. The problem occurs constantly. The problem has not changed since onset.Pertinent negatives include no chest pain, no abdominal pain and no headaches. Nothing aggravates the symptoms. Nothing relieves the symptoms.       Past Medical History:  Diagnosis Date  . CHF (congestive heart failure) ([place])   . COPD (chronic obstructive pulmonary disease) ([place])   . GERD (gastroesophageal reflux disease)   . Lung cancer ([place]) 2009  . NSTEMI (non-ST elevated myocardial infarction) ([place]) 07/16/2017   2/19 PCI/DES x1 to Lcx with moderate (50%) in the RCA, mild (20%) in LAD    Patient Active Problem List   Diagnosis Date Noted  . 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 Sepsis due to cellulitis ([place]) 05/16/2019  . AKI (acute kidney injury) ([place]) 05/16/2019  . Hypokalemia 05/16/2019  . CHF (congestive heart failure) ([place])   . Hematoma of leg, right, sequela 05/09/2019  . Traumatic open wound of right lower leg with infection   . Wound infection 04/15/2019  . CAD (coronary artery disease)-- [place] IN 06/2017 04/15/2019  . COPD (chronic obstructive pulmonary disease) ([place]) 09/07/2017  . GERD (gastroesophageal reflux disease) 09/07/2017  . Essential hypertension 07/20/2017  . History of heart attack 07/19/2017  . Lung cancer ([place]) 2009    Past Surgical History:  Procedure Laterality Date  . ANKLE FRACTURE SURGERY Right 1970s  . CATARACT EXTRACTION Left   . CORONARY ANGIOPLASTY WITH  STENT PLACEMENT  07/19/2017  . CORONARY STENT INTERVENTION N/A 07/19/2017   Procedure: CORONARY STENT INTERVENTION;  Surgeon: [name], MD;  Location: [place] [facility];  Service: Cardiovascular;  Laterality: N/A;  . LEFT HEART CATH AND CORONARY ANGIOGRAPHY N/A 07/19/2017   Procedure: LEFT HEART CATH AND CORONARY ANGIOGRAPHY;  Surgeon: [name], MD;  Location: [place] [facility];  Service: Cardiovascular;  Laterality: N/A;  . LUNG BIOPSY         Family History  Problem Relation Age of Onset  . Healthy Mother   . Breast cancer Sister   . Heart disease Brother   . Heart failure Brother   . Breast cancer Sister   . Hypertension Son   . Hyperlipidemia Son     Social History   Tobacco Use  . Smoking status: Former Smoker    Packs/day: 1.00    Years: 42.00    Pack years: 42.00    Types: Cigarettes    Quit date: 1996    Years since quitting: 25.1  . Smokeless tobacco: Never Used  Substance Use Topics  . Alcohol use: No  . Drug use: Not Currently    Home Medications Prior to Admission medications   Medication Sig Start Date End Date Taking? Authorizing Provider  acetaminophen (TYLENOL) 325 MG tablet Take 2 tablets (650 mg total) by mouth every 6 (six) hours as needed for mild pain (or Fever >/= 101). 04/17/19   Roxan Hockey, MD  albuterol (VENTOLIN HFA) 108 (90  Base) MCG/ACT inhaler Inhale 2 puffs into the lungs every 4 (four) hours as needed for wheezing or shortness of breath. 04/17/19   Roxan Hockey, MD  aspirin 81 MG EC tablet Take 1 tablet (81 mg total) by mouth daily with breakfast. 04/17/19   Denton Brick, [place], MD  atorvastatin (LIPITOR) 80 MG tablet Take 1 tablet (80 mg total) by mouth every evening. 04/17/19   Roxan Hockey, MD  furosemide (LASIX) 20 MG tablet Take 1 tablet (20 mg total) by mouth every morning. 06/11/19   Loman Brooklyn, FNP  meclizine (ANTIVERT) 12.5 MG tablet Take 12.5 mg by mouth daily as needed for dizziness.  10/15/17    [provider]  multivitamin-lutein (OCUVITE-LUTEIN) CAPS capsule Take 1 capsule by mouth daily. 04/18/19   Roxan Hockey, MD  niacin 500 MG tablet Take 500 mg by mouth every morning.     [provider]  pantoprazole (PROTONIX) 40 MG tablet Take 1 tablet (40 mg total) by mouth daily. Patient taking differently: Take 40 mg by mouth every morning.  03/19/19   Loman Brooklyn, FNP    Allergies    Patient has no known allergies.  Review of Systems   Review of Systems  Constitutional: Negative for appetite change and fatigue.  HENT: Negative for congestion, ear discharge and sinus pressure.   Eyes: Negative for discharge.  Respiratory: Negative for cough.   Cardiovascular: Negative for chest pain.  Gastrointestinal: Negative for abdominal pain and diarrhea.  Genitourinary: Negative for frequency and hematuria.  Musculoskeletal: Negative for back pain.       Bleeding from right thigh  Skin: Negative for rash.  Neurological: Negative for seizures and headaches.  Psychiatric/Behavioral: Negative for hallucinations.    Physical Exam Updated Vital Signs BP 132/76 (BP Location: Right Arm)   Pulse 62   Temp 98.9 F (37.2 C) (Oral)   Resp 16   Ht 6' (1.829 m)   Wt 81.6 kg   SpO2 96%   BMI 24.41 kg/m   Physical Exam Vitals and nursing note reviewed.  Constitutional:      Appearance: He is well-developed.  HENT:     Head: Normocephalic.     Nose: Nose normal.  Eyes:     General: No scleral icterus.    Conjunctiva/sclera: Conjunctivae normal.  Neck:     Thyroid: No thyromegaly.  Cardiovascular:     Rate and Rhythm: Normal rate and regular rhythm.     Heart sounds: No murmur. No friction rub. No gallop.   Pulmonary:     Breath sounds: No stridor. No wheezing or rales.  Chest:     Chest wall: No tenderness.  Abdominal:     General: There is no distension.     Tenderness: There is no abdominal tenderness. There is no rebound.  Musculoskeletal:         General: Normal range of motion.     Cervical back: Neck supple.     Comments: Patient has a half a centimeter laceration that opened up in his right thigh and is bleeding.  It appears to be blood oozing out from a large hematoma.  Lymphadenopathy:     Cervical: No cervical adenopathy.  Skin:    Findings: No erythema or rash.  Neurological:     Mental Status: He is alert and oriented to person, place, and time.     Motor: No abnormal muscle tone.     Coordination: Coordination normal.  Psychiatric:  Behavior: Behavior normal.     ED Results / Procedures / Treatments   Labs (all labs ordered are listed, but only abnormal results are displayed) Labs Reviewed - No data to display  EKG None  Radiology No results found.  Procedures Procedures (including critical care time)  Medications Ordered in ED Medications - No data to display  ED Course  I have reviewed the triage vital signs and the nursing notes.  Pertinent labs & imaging results that were available during my care of the patient were reviewed by me and considered in my medical decision making (see chart for details).    MDM Rules/Calculators/A&P                      Patient with large hematoma to right thigh.  We will able to get out most of the hematoma.  And apply clotting solution along with a bandage.  Patient had bleeding stopped and will follow-up Final Clinical Impression(s) / ED Diagnoses Final diagnoses:  Visit for wound check    Rx / DC Orders ED Discharge Orders    None       Milton Ferguson, MD 07/07/19 [phone]

## 2019-07-08 ENCOUNTER — Other Ambulatory Visit: Payer: Self-pay

## 2019-07-08 ENCOUNTER — Encounter (HOSPITAL_COMMUNITY): Payer: Self-pay | Admitting: *Deleted

## 2019-07-08 ENCOUNTER — Emergency Department (HOSPITAL_COMMUNITY)
Admission: EM | Admit: 2019-07-08 | Discharge: 2019-07-08 | Disposition: A | Discharge status: Home / Self Care | Payer: Medicare Other | Attending: Emergency Medicine | Admitting: Emergency Medicine

## 2019-07-08 DIAGNOSIS — I252 Old myocardial infarction: Secondary | ICD-10-CM | POA: Diagnosis not present

## 2019-07-08 DIAGNOSIS — Z79899 Other long term (current) drug therapy: Secondary | ICD-10-CM | POA: Insufficient documentation

## 2019-07-08 DIAGNOSIS — I110 Hypertensive heart disease with heart failure: Secondary | ICD-10-CM | POA: Diagnosis not present

## 2019-07-08 DIAGNOSIS — X58XXXD Exposure to other specified factors, subsequent encounter: Secondary | ICD-10-CM | POA: Diagnosis not present

## 2019-07-08 DIAGNOSIS — Z7982 Long term (current) use of aspirin: Secondary | ICD-10-CM | POA: Insufficient documentation

## 2019-07-08 DIAGNOSIS — Z955 Presence of coronary angioplasty implant and graft: Secondary | ICD-10-CM | POA: Insufficient documentation

## 2019-07-08 DIAGNOSIS — I509 Heart failure, unspecified: Secondary | ICD-10-CM | POA: Insufficient documentation

## 2019-07-08 DIAGNOSIS — Z85118 Personal history of other malignant neoplasm of bronchus and lung: Secondary | ICD-10-CM | POA: Insufficient documentation

## 2019-07-08 DIAGNOSIS — Z5189 Encounter for other specified aftercare: Secondary | ICD-10-CM

## 2019-07-08 DIAGNOSIS — S71101D Unspecified open wound, right thigh, subsequent encounter: Secondary | ICD-10-CM | POA: Insufficient documentation

## 2019-07-08 DIAGNOSIS — Z87891 Personal history of nicotine dependence: Secondary | ICD-10-CM | POA: Insufficient documentation

## 2019-07-08 DIAGNOSIS — J449 Chronic obstructive pulmonary disease, unspecified: Secondary | ICD-10-CM | POA: Diagnosis not present

## 2019-07-08 DIAGNOSIS — I2510 Atherosclerotic heart disease of native coronary artery without angina pectoris: Secondary | ICD-10-CM | POA: Diagnosis not present

## 2019-07-08 MED ORDER — DOXYCYCLINE HYCLATE 100 MG PO CAPS: 100.0000 mg | ORAL_CAPSULE | Freq: Two times a day (BID) | ORAL | 0 refills | Status: DC

## 2019-07-08 NOTE — ED Triage Notes (Signed)
Seen 3 days ago for wound on right thigh, states he was advised to return for recheck today. States wound in is no longer bleeding

## 2019-07-08 NOTE — ED Provider Notes (Signed)
[facility] EMERGENCY DEPARTMENT Provider Note   CSN: [id number] Arrival date & time: 07/08/19  1042     History Chief Complaint  Patient presents with  . Wound Check    Paul Snyder is a 84 y.o. male with a history of CAD, CHF, COPD, and GERD who presents to the emergency department for wound recheck today. Patient has chronic RLE wounds related to tractor injury several months prior, had debridement of wound November 2020, re-hospitalized December 2020, followed by general surgery- most recently seen 07/04/19 and wound appeared to be doing well. Seen in the ED 07/05/19 when he had spontaneous bleeding from the wound with hematoma which was evacuated with control of bleeding & discharge home.  He was told to see PCP or return to ED Monday for re-eval which is why he is here today. He states bleeding resolved, no recurrence, has had some drainage. No alleviating/aggravating factors. Denies fever, chills, vomiting, numbness, or focal weakness   HPI     Past Medical History:  Diagnosis Date  . CHF (congestive heart failure) ([place])   . COPD (chronic obstructive pulmonary disease) ([place])   . GERD (gastroesophageal reflux disease)   . Lung cancer ([place]) 2009  . NSTEMI (non-ST elevated myocardial infarction) ([place]) 07/16/2017   2/19 PCI/DES x1 to Lcx with moderate (50%) in the RCA, mild (20%) in LAD    Patient Active Problem List   Diagnosis Date Noted  . 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 Sepsis due to cellulitis ([place]) 05/16/2019  . AKI (acute kidney injury) ([place]) 05/16/2019  . Hypokalemia 05/16/2019  . CHF (congestive heart failure) ([place])   . Hematoma of leg, right, sequela 05/09/2019  . Traumatic open wound of right lower leg with infection   . Wound infection 04/15/2019  . CAD (coronary artery disease)-- STENTS IN 06/2017 04/15/2019  . COPD (chronic obstructive pulmonary disease) ([place]) 09/07/2017  . GERD (gastroesophageal reflux disease)  09/07/2017  . Essential hypertension 07/20/2017  . History of heart attack 07/19/2017  . Lung cancer ([place]) 2009    Past Surgical History:  Procedure Laterality Date  . ANKLE FRACTURE SURGERY Right 1970s  . CATARACT EXTRACTION Left   . CORONARY ANGIOPLASTY WITH STENT PLACEMENT  07/19/2017  . CORONARY STENT INTERVENTION N/A 07/19/2017   Procedure: CORONARY STENT INTERVENTION;  Surgeon: [name], MD;  Location: [facility];  Service: Cardiovascular;  Laterality: N/A;  . LEFT HEART CATH AND CORONARY ANGIOGRAPHY N/A 07/19/2017   Procedure: LEFT HEART CATH AND CORONARY ANGIOGRAPHY;  Surgeon: [name], MD;  Location: [facility];  Service: Cardiovascular;  Laterality: N/A;  . LUNG BIOPSY         Family History  Problem Relation Age of Onset  . Healthy Mother   . Breast cancer Sister   . Heart disease Brother   . Heart failure Brother   . Breast cancer Sister   . Hypertension Son   . Hyperlipidemia Son     Social History   Tobacco Use  . Smoking status: Former Smoker    Packs/day: 1.00    Years: 42.00    Pack years: 42.00    Types: Cigarettes    Quit date: 1996    Years since quitting: 25.1  . Smokeless tobacco: Never Used  Substance Use Topics  . Alcohol use: No  . Drug use: Not Currently    Home Medications Prior to Admission medications   Medication Sig Start Date End Date  Taking? Authorizing Provider  acetaminophen (TYLENOL) 325 MG tablet Take 2 tablets (650 mg total) by mouth every 6 (six) hours as needed for mild pain (or Fever >/= 101). 04/17/19   Roxan Hockey, MD  albuterol (VENTOLIN HFA) 108 (90 Base) MCG/ACT inhaler Inhale 2 puffs into the lungs every 4 (four) hours as needed for wheezing or shortness of breath. 04/17/19   Roxan Hockey, MD  aspirin 81 MG EC tablet Take 1 tablet (81 mg total) by mouth daily with breakfast. 04/17/19   Denton Brick, [place], MD  atorvastatin (LIPITOR) 80 MG tablet Take 1 tablet (80 mg total) by mouth  every evening. 04/17/19   Roxan Hockey, MD  furosemide (LASIX) 20 MG tablet Take 1 tablet (20 mg total) by mouth every morning. 06/11/19   Loman Brooklyn, FNP  meclizine (ANTIVERT) 12.5 MG tablet Take 12.5 mg by mouth daily as needed for dizziness.  10/15/17   [provider]  multivitamin-lutein (OCUVITE-LUTEIN) CAPS capsule Take 1 capsule by mouth daily. 04/18/19   Roxan Hockey, MD  niacin 500 MG tablet Take 500 mg by mouth every morning.     [provider]  pantoprazole (PROTONIX) 40 MG tablet Take 1 tablet (40 mg total) by mouth daily. Patient taking differently: Take 40 mg by mouth every morning.  03/19/19   Loman Brooklyn, FNP    Allergies    Patient has no known allergies.  Review of Systems   Review of Systems  Constitutional: Negative for chills and fever.  Respiratory: Negative for shortness of breath.   Cardiovascular: Negative for chest pain.  Gastrointestinal: Negative for abdominal pain and vomiting.  Skin: Positive for wound. Negative for color change.  Neurological: Negative for weakness and numbness.    Physical Exam Updated Vital Signs BP (!) 123/48 (BP Location: Right Arm)   Pulse (!) 56   Temp 97.6 F (36.4 C) (Oral)   Resp 16   SpO2 94%   Physical Exam Vitals and nursing note reviewed.  Constitutional:      General: He is not in acute distress.    Appearance: He is not ill-appearing or toxic-appearing.  HENT:     Head: Normocephalic and atraumatic.  Cardiovascular:     Pulses:          Dorsalis pedis pulses are 2+ on the right side and 2+ on the left side.       Posterior tibial pulses are 2+ on the right side and 2+ on the left side.  Pulmonary:     Effort: Pulmonary effort is normal.  Musculoskeletal:     Comments: Lower extremities: RLE inner thigh there is a chronic appearing wound present as pictured below. Most distal portion of the wound there is a small opening, not deep with probing, there is some green &  serosanginous drainage present on bandage and after removal. There area is mildly erythematous with some induration. No fluctuance. No active bleeding. No significant tenderness. Patient has intact AROM to bilateral hips, knees, ankles, and all digits.   Skin:    General: Skin is warm and dry.     Capillary Refill: Capillary refill takes less than 2 seconds.  Neurological:     Mental Status: He is alert.     Comments: Alert. Clear speech. Sensation grossly intact to bilateral lower extremities. 5/5 strength with plantar/dorsiflexion bilaterally.   Psychiatric:        Mood and Affect: Mood normal.        Behavior: Behavior normal.  ED Results / Procedures / Treatments   Labs (all labs ordered are listed, but only abnormal results are displayed) Labs Reviewed - No data to display  EKG None  Radiology No results found.  Procedures Procedures (including critical care time)  Medications Ordered in ED Medications - No data to display  ED Course  I have reviewed the triage vital signs and the nursing notes.  Pertinent labs & imaging results that were available during my care of the patient were reviewed by me and considered in my medical decision making (see chart for details).    MDM Rules/Calculators/A&P                      Patient presents to the emergency department for wound recheck after recent ED visit a few days prior for bleeding from chronic wound.  No recurrence of bleeding.  Patient does have some mild erythema as well as drainage with surrounding induration.  There is no palpable fluctuance to indicate abscess requiring I&D. NVI distally.  Concern for a degree of cellulitis.  Patient does not appear septic.  Discussed findings and plan of care supervising physician Dr. Lacinda Axon evaluated the patient, recommends discharge home with doxycycline with follow up with general surgery who has been seeing patient outpatient, discussed with general surgeon Dr. Constance Haw who  is in agreement.  I discussed results, treatment plan, need for follow-up, and return precautions with the patient. Provided opportunity for questions, patient confirmed understanding and is in agreement with plan.    Final Clinical Impression(s) / ED Diagnoses Final diagnoses:  Visit for wound check    Rx / DC Orders ED Discharge Orders         Ordered    doxycycline (VIBRAMYCIN) 100 MG capsule  2 times daily     07/08/19 [address], [place], PA-C 07/08/19 1351    Nat Christen, MD 07/09/19 [id number]

## 2019-07-08 NOTE — Discharge Instructions (Addendum)
You were seen in the emergency department today for a wound recheck.  The area looks somewhat infected therefore we are starting you on doxycycline, an antibiotic, please take this as prescribed.  We have prescribed you new medication(s) today. Discuss the medications prescribed today with your pharmacist as they can have adverse effects and interactions with your other medicines including over the counter and prescribed medications. Seek medical evaluation if you start to experience new or abnormal symptoms after taking one of these medicines, seek care immediately if you start to experience difficulty breathing, feeling of your throat closing, facial swelling, or rash as these could be indications of a more serious allergic reaction  Please follow-up with Dr. Constance Haw, your general surgeon, in office within the next 1 week, if you do not have an appointment already scheduled please give the office a call.  Return to the emergency department for new or worsening symptoms including but not limited to bleeding, increased redness, fevers, increased drainage, swelling, or any other concerns.

## 2019-07-10 ENCOUNTER — Ambulatory Visit (INDEPENDENT_AMBULATORY_CARE_PROVIDER_SITE_OTHER): Payer: Medicare Other | Admitting: *Deleted

## 2019-07-10 DIAGNOSIS — Z0000 Encounter for general adult medical examination without abnormal findings: Secondary | ICD-10-CM

## 2019-07-10 NOTE — Progress Notes (Signed)
MEDICARE ANNUAL WELLNESS VISIT  07/10/2019  Telephone Visit Disclaimer This Medicare AWV was conducted by telephone due to national recommendations for restrictions regarding the COVID-19 Pandemic (e.g. social distancing).  I verified, using two identifiers, that I am speaking with Paul Snyder or their authorized healthcare agent. I discussed the limitations, risks, security, and privacy concerns of performing an evaluation and management service by telephone and the potential availability of an in-person appointment in the future. The patient expressed understanding and agreed to proceed.   Subjective:  Paul Snyder is a 84 y.o. male patient of Loman Brooklyn, FNP who had a Medicare Annual Wellness Visit today via telephone. Paul Snyder is Retired and lives alone. he has 2 children. he reports that he is socially active and does interact with friends/family regularly. he is minimally physically active and enjoys watching TV and socializing with friends and family.  Patient Care Team: Loman Brooklyn, FNP as PCP - General (Family Medicine) Arnoldo Lenis, MD as Consulting Physician (Cardiology)  Advanced Directives 07/10/2019 07/08/2019 07/05/2019 05/16/2019 05/16/2019 05/16/2019 04/15/2019  Does Patient Have a Medical Advance Directive? Yes Yes No - Yes Yes Yes  Type of Paramedic of [place];Living will - - - [place];Living will [place];Living will [place];Living will  Does patient want to make changes to medical advance directive? No - Patient declined - - No - Patient declined - - No - Patient declined  Copy of [place] in Chart? No - copy requested - - - No - copy requested - No - copy requested  Would patient like information on creating a medical advance directive? - - - - - - No - Patient declined    Hospital Utilization Over the Past 12 Months: # of hospitalizations or  ER visits: 5 # of surgeries: 0  Review of Systems    Patient reports that his overall health is better compared to last year.  History obtained from chart review  Patient Reported Readings (BP, Pulse, CBG, Weight, etc) none  Pain Assessment Pain : No/denies pain     Current Medications & Allergies (verified) Allergies as of 07/10/2019   No Known Allergies     Medication List       Accurate as of July 10, 2019  8:43 AM. If you have any questions, ask your nurse or doctor.        acetaminophen 325 MG tablet Commonly known as: TYLENOL Take 2 tablets (650 mg total) by mouth every 6 (six) hours as needed for mild pain (or Fever >/= 101).   albuterol 108 (90 Base) MCG/ACT inhaler Commonly known as: VENTOLIN HFA Inhale 2 puffs into the lungs every 4 (four) hours as needed for wheezing or shortness of breath.   aspirin 81 MG EC tablet Take 1 tablet (81 mg total) by mouth daily with breakfast.   atorvastatin 80 MG tablet Commonly known as: LIPITOR Take 1 tablet (80 mg total) by mouth every evening.   doxycycline 100 MG capsule Commonly known as: VIBRAMYCIN Take 1 capsule (100 mg total) by mouth 2 (two) times daily.   furosemide 20 MG tablet Commonly known as: LASIX Take 1 tablet (20 mg total) by mouth every morning.   meclizine 12.5 MG tablet Commonly known as: ANTIVERT Take 12.5 mg by mouth daily as needed for dizziness.   multivitamin-lutein Caps capsule Take 1 capsule by mouth daily.   niacin 500 MG tablet  Take 500 mg by mouth every morning.   pantoprazole 40 MG tablet Commonly known as: PROTONIX Take 1 tablet (40 mg total) by mouth daily. What changed: when to take this       History (reviewed): Past Medical History:  Diagnosis Date  . CHF (congestive heart failure) ([place])   . COPD (chronic obstructive pulmonary disease) ([place])   . GERD (gastroesophageal reflux disease)   . Lung cancer ([place]) 2009  . NSTEMI (non-ST elevated myocardial infarction)  ([place]) 07/16/2017   2/19 PCI/DES x1 to Lcx with moderate (50%) in the RCA, mild (20%) in LAD   Past Surgical History:  Procedure Laterality Date  . ANKLE FRACTURE SURGERY Right 1970s  . CATARACT EXTRACTION Left   . CORONARY ANGIOPLASTY WITH STENT PLACEMENT  07/19/2017  . CORONARY STENT INTERVENTION N/A 07/19/2017   Procedure: CORONARY STENT INTERVENTION;  Surgeon: [name], MD;  Location: [facility];  Service: Cardiovascular;  Laterality: N/A;  . LEFT HEART CATH AND CORONARY ANGIOGRAPHY N/A 07/19/2017   Procedure: LEFT HEART CATH AND CORONARY ANGIOGRAPHY;  Surgeon: [name], MD;  Location: [facility];  Service: Cardiovascular;  Laterality: N/A;  . LUNG BIOPSY     Family History  Problem Relation Age of Onset  . Healthy Mother   . Breast cancer Sister   . Heart disease Brother   . Heart failure Brother   . Breast cancer Sister   . Hypertension Son   . Hyperlipidemia Son    Social History   Socioeconomic History  . Marital status: Widowed    Spouse name: Not on file  . Number of children: 2  . Years of education: 7  . Highest education level: 7th grade  Occupational History  . Occupation: retired  Tobacco Use  . Smoking status: Former Smoker    Packs/day: 1.00    Years: 42.00    Pack years: 42.00    Types: Cigarettes    Quit date: 1996    Years since quitting: 25.1  . Smokeless tobacco: Never Used  Substance and Sexual Activity  . Alcohol use: No  . Drug use: Not Currently  . Sexual activity: Not Currently    Birth control/protection: None  Other Topics Concern  . Not on file  Social History Narrative   Lives by himself   Social Determinants of Health   Financial Resource Strain: Low Risk   . Difficulty of Paying Living Expenses: Not hard at all  Food Insecurity: No Food Insecurity  . Worried About Charity fundraiser in the Last Year: Never true  . Ran Out of Food in the Last Year: Never true  Transportation Needs: No  Transportation Needs  . Lack of Transportation (Medical): No  . Lack of Transportation (Non-Medical): No  Physical Activity: Inactive  . Days of Exercise per Week: 0 days  . Minutes of Exercise per Session: 0 min  Stress: No Stress Concern Present  . Feeling of Stress : Not at all  Social Connections: Slightly Isolated  . Frequency of Communication with Friends and Family: More than three times a week  . Frequency of Social Gatherings with Friends and Family: More than three times a week  . Attends Religious Services: More than 4 times per year  . Active Member of Clubs or Organizations: Yes  . Attends Archivist Meetings: More than 4 times per year  . Marital Status: Widowed    Activities of Daily Living In your present state of  health, do you have any difficulty performing the following activities: 07/10/2019 05/16/2019  Hearing? N N  Vision? N N  Comment gets yearly eye exam-wears glasses -  Difficulty concentrating or making decisions? N N  Walking or climbing stairs? N N  Comment he has been using a cane recently just in case but says he doesn't really need it -  Dressing or bathing? N N  Doing errands, shopping? N N  Preparing Food and eating ? N -  Using the Toilet? N -  In the past six months, have you accidently leaked urine? N -  Do you have problems with loss of bowel control? N -  Managing your Medications? N -  Managing your Finances? N -  Housekeeping or managing your Housekeeping? N -  Some recent data might be hidden    Patient Education/ Literacy How often do you need to have someone help you when you read instructions, pamphlets, or other written materials from your doctor or pharmacy?: 3 - Sometimes What is the last grade level you completed in school?: 7th grade  Exercise Current Exercise Habits: Home exercise routine, Type of exercise: stretching, Time (Minutes): 20, Frequency (Times/Week): 7, Weekly Exercise (Minutes/Week): 140, Intensity:  Mild, Exercise limited by: cardiac condition(s);respiratory conditions(s)  Diet Patient reports consuming 2 meals a day and 1 snack(s) a day Patient reports that his primary diet is: Regular Patient reports that she does have regular access to food.   Depression Screen PHQ 2/9 Scores 07/10/2019 06/11/2019 01/03/2019 12/13/2017 08/24/2017  PHQ - 2 Score 0 0 0 0 0  PHQ- 9 Score - 0 0 0 0  Exception Documentation - - - - (No Data)     Fall Risk Fall Risk  07/10/2019 07/04/2019 07/04/2019 06/11/2019 04/15/2019  Falls in the past year? 1 0 0 1 1  Number falls in past yr: 0 - - 1 0  Injury with Fall? 0 - - 0 0  Risk for fall due to : - - - History of fall(s);Impaired balance/gait History of fall(s);Impaired balance/gait  Risk for fall due to: Comment - - - - -  Follow up - - - - Falls prevention discussed     Objective:  Paul Snyder seemed alert and oriented and he participated appropriately during our telephone visit.  Blood Pressure Weight BMI  BP Readings from Last 3 Encounters:  07/08/19 (!) 123/48  07/05/19 132/76  07/04/19 (!) 123/59   Wt Readings from Last 3 Encounters:  07/05/19 180 lb (81.6 kg)  07/04/19 168 lb (76.2 kg)  06/11/19 165 lb (74.8 kg)   BMI Readings from Last 1 Encounters:  07/05/19 24.41 kg/m    *Unable to obtain current vital signs, weight, and BMI due to telephone visit type  Hearing/Vision  . Paul Snyder did not seem to have difficulty with hearing/understanding during the telephone conversation . Reports that he has had a formal eye exam by an eye care professional within the past year . Reports that he has not had a formal hearing evaluation within the past year *Unable to fully assess hearing and vision during telephone visit type  Cognitive Function: 6CIT Screen 07/10/2019  What Year? 0 points  What month? 0 points  What time? 0 points  Count back from 20 0 points  Months in reverse 4 points  Repeat phrase 2 points  Total Score 6   (Normal:0-7,  Significant for Dysfunction: >8)  Normal Cognitive Function Screening: Yes   Immunization & Health Maintenance Record Immunization  History  Administered Date(s) Administered  . Fluad Quad(high Dose 65+) 02/25/2019  . Influenza-Unspecified 03/08/2017, 02/25/2019  . Pneumococcal Conjugate-13 08/09/2016  . Pneumococcal Polysaccharide-23 08/25/2017, 01/23/2018  . Tdap 03/12/2019  . Zoster Recombinat (Shingrix) 01/04/2019, 04/06/2019    Health Maintenance  Topic Date Due  . TETANUS/TDAP  03/11/2029  . INFLUENZA VACCINE  Completed  . PNA vac Low Risk Adult  Completed       Assessment  This is a routine wellness examination for Paul Snyder.  Health Maintenance: Due or Overdue There are no preventive care reminders to display for this patient.  Paul Snyder does not need a referral for Community Assistance: Care Management:   no Social Work:    no Prescription Assistance:  no Nutrition/Diabetes Education:  no   Plan:  Personalized Goals Goals Addressed            This Visit's Progress   . DIET - INCREASE WATER INTAKE       Try to drink 6-8 glasses of water daily      Personalized Health Maintenance & Screening Recommendations  Advanced directives: has an advanced directive - a copy HAS NOT been provided.  Lung Cancer Screening Recommended: not applicable (Low Dose CT Chest recommended if Age 38-80 years, 30 pack-year currently smoking OR have quit w/in past 15 years) Hepatitis C Screening recommended: no HIV Screening recommended: no  Advanced Directives: Written information was not prepared per patient's request.  Referrals & Orders No orders of the defined types were placed in this encounter.   Follow-up Plan . Follow-up with Loman Brooklyn, FNP as planned . Keep your appointment with the General Surgeon for your leg . Bring a copy of your Advanced Directives in for our records   I have personally reviewed and noted the following in the patient's  chart:   . Medical and social history . Use of alcohol, tobacco or illicit drugs  . Current medications and supplements . Functional ability and status . Nutritional status . Physical activity . Advanced directives . List of other physicians . Hospitalizations, surgeries, and ER visits in previous 12 months . Vitals . Screenings to include cognitive, depression, and falls . Referrals and appointments  In addition, I have reviewed and discussed with Paul Snyder certain preventive protocols, quality metrics, and best practice recommendations. A written personalized care plan for preventive services as well as general preventive health recommendations is available and can be mailed to the patient at his request.      Milas Hock, LPN  9/89/2119

## 2019-07-10 NOTE — Patient Instructions (Signed)

## 2019-07-16 ENCOUNTER — Ambulatory Visit (INDEPENDENT_AMBULATORY_CARE_PROVIDER_SITE_OTHER): Payer: Medicare Other | Admitting: General Surgery

## 2019-07-16 ENCOUNTER — Encounter: Payer: Self-pay | Admitting: General Surgery

## 2019-07-16 ENCOUNTER — Other Ambulatory Visit: Payer: Self-pay

## 2019-07-16 VITALS — BP 123/68 | HR 53 | Temp 97.7°F | Resp 16 | Ht 72.0 in | Wt 165.2 lb

## 2019-07-16 DIAGNOSIS — L089 Local infection of the skin and subcutaneous tissue, unspecified: Secondary | ICD-10-CM

## 2019-07-16 DIAGNOSIS — S81801D Unspecified open wound, right lower leg, subsequent encounter: Secondary | ICD-10-CM

## 2019-07-16 DIAGNOSIS — S8011XS Contusion of right lower leg, sequela: Secondary | ICD-10-CM

## 2019-07-17 ENCOUNTER — Encounter: Payer: Self-pay | Admitting: General Surgery

## 2019-07-17 NOTE — Progress Notes (Signed)
[facility] Note   HPI:  84 y.o. Male presents to clinic for follow-up evaluation of his right traumatic leg wound. The hematoma we noted last time started to drain and the old blood was evacuated. The patient went to the Ed and given the hematoma and some skin redness was placed on antibiotics. Since that time he says he has been doing well. The area is softer.   Review of Systems:  No fever or chills No drainage  All other review of systems: otherwise negative   Vital Signs:  BP 123/68   Pulse (!) 53   Temp 97.7 F (36.5 C) (Oral)   Resp 16   Ht 6' (1.829 m)   Wt 165 lb 3.2 oz (74.9 kg)   SpO2 92%   BMI 22.41 kg/m    Physical Exam:  Physical Exam Vitals reviewed.  HENT:     Head: Normocephalic.  Pulmonary:     Effort: Pulmonary effort is normal.  Abdominal:     General: There is no distension.     Palpations: Abdomen is soft.  Musculoskeletal:     Comments: Right thigh wound healed, medial aspect soft where prior hematoma located, skin hyperemic but no signs of cellulitis   Neurological:     Mental Status: He is alert.     Assessment:  84 y.o. yo Male with a healing right traumatic leg wound. His hematoma drained on its own. Overall this looks much better.  Plan:  - Activity as tolerated   - No further wound care needed  - PRN follow up   All of the above recommendations were discussed with the patient and patient's family, and all of patient's and family's questions were answered to their expressed satisfaction.  [name], MD [facility] [address] [place],  [id number] [phone] (office)

## 2019-07-31 ENCOUNTER — Telehealth (INDEPENDENT_AMBULATORY_CARE_PROVIDER_SITE_OTHER): Payer: Medicare Other | Admitting: Cardiology

## 2019-07-31 ENCOUNTER — Encounter: Payer: Self-pay | Admitting: Cardiology

## 2019-07-31 VITALS — BP 143/60 | HR 58 | Ht 72.0 in | Wt 165.0 lb

## 2019-07-31 DIAGNOSIS — E782 Mixed hyperlipidemia: Secondary | ICD-10-CM

## 2019-07-31 DIAGNOSIS — I2510 Atherosclerotic heart disease of native coronary artery without angina pectoris: Secondary | ICD-10-CM

## 2019-07-31 DIAGNOSIS — R001 Bradycardia, unspecified: Secondary | ICD-10-CM

## 2019-07-31 NOTE — Patient Instructions (Signed)

## 2019-07-31 NOTE — Progress Notes (Signed)
Virtual Visit via Telephone Note   This visit type was conducted due to national recommendations for restrictions regarding the COVID-19 Pandemic (e.g. social distancing) in an effort to limit this patient's exposure and mitigate transmission in our community.  Due to his co-morbid illnesses, this patient is at least at moderate risk for complications without adequate follow up.  This format is felt to be most appropriate for this patient at this time.  The patient did not have access to video technology/had technical difficulties with video requiring transitioning to audio format only (telephone).  All issues noted in this document were discussed and addressed.  No physical exam could be performed with this format.  Please refer to the patient's chart for his  consent to telehealth for [facility].   The patient was identified using 2 identifiers.  Date:  07/31/2019   ID:  Paul Snyder, DOB 1936/03/14, MRN [id number]  Patient Location: Home Provider Location: Office  PCP:  Loman Brooklyn, FNP  Cardiologist:  Carlyle Dolly, MD  Electrophysiologist:  None   Evaluation Performed:  Follow-Up Visit  Chief Complaint:  Follow up visit  History of Present Illness:    Paul Snyder is a 84 y.o. male seen today for follow up of the following medical problems.  1. CAD - admitted with NSTEMI 06/2017. Cath as reported below, received DES to LCX. - 06/2017 echo VLEF 55-60%, grade II diastolic dysfunciton - lopressor stopped due to bradycardia, down to 40s during cardiac rehab.   -  Lisinopril stopped ACEI due to low bp's by pcp. No beta blocker due to bradycardia  - no recent chest pain. No SOB or DOE - compliant with meds   2. HTN - more recent issues with low bp's, resolved off bp meds   3. Hyperlipidemia - he is compliant with statin  4. COPD - followed by pcp  5. Conduction disease/bradycardia - isolated dizzy spell but no other symptoms - no recent  symptoms.    COVID vaccine x 2.     The patient does not have symptoms concerning for COVID-19 infection (fever, chills, cough, or new shortness of breath).    Past Medical History:  Diagnosis Date  . CHF (congestive heart failure) ([place])   . COPD (chronic obstructive pulmonary disease) ([place])   . GERD (gastroesophageal reflux disease)   . Lung cancer ([place]) 2009  . NSTEMI (non-ST elevated myocardial infarction) ([place]) 07/16/2017   2/19 PCI/DES x1 to Lcx with moderate (50%) in the RCA, mild (20%) in LAD   Past Surgical History:  Procedure Laterality Date  . ANKLE FRACTURE SURGERY Right 1970s  . CATARACT EXTRACTION Left   . CORONARY ANGIOPLASTY WITH STENT PLACEMENT  07/19/2017  . CORONARY STENT INTERVENTION N/A 07/19/2017   Procedure: CORONARY STENT INTERVENTION;  Surgeon: [name], MD;  Location: [facility];  Service: Cardiovascular;  Laterality: N/A;  . LEFT HEART CATH AND CORONARY ANGIOGRAPHY N/A 07/19/2017   Procedure: LEFT HEART CATH AND CORONARY ANGIOGRAPHY;  Surgeon: [name], MD;  Location: [facility];  Service: Cardiovascular;  Laterality: N/A;  . LUNG BIOPSY       No outpatient medications have been marked as taking for the 07/31/19 encounter (Appointment) with Arnoldo Lenis, MD.     Allergies:   Patient has no known allergies.   Social History   Tobacco Use  . Smoking status: Former Smoker    Packs/day: 1.00    Years: 42.00  Pack years: 42.00    Types: Cigarettes    Quit date: 1996    Years since quitting: 25.2  . Smokeless tobacco: Never Used  Substance Use Topics  . Alcohol use: No  . Drug use: Not Currently     Family Hx: The patient's family history includes Breast cancer in his sister and sister; Healthy in his mother; Heart disease in his brother; Heart failure in his brother; Hyperlipidemia in his son; Hypertension in his son.  ROS:   Please see the history of present illness.     All other systems reviewed and  are negative.   Prior CV studies:   The following studies were reviewed today:  06/2017 cath  Prox Cx lesion is 30% stenosed.  Prox RCA to Mid RCA lesion is 55% stenosed.  Mid RCA lesion is 50% stenosed.  Prox LAD to Mid LAD lesion is 20% stenosed.  A stent was successfully placed.  Post intervention, there is a 0% residual stenosis.  Mid Cx lesion is 99% stenosed.  Multi vessel CAD with proximal irregularity of the LAD, 20% narrowing of the LAD in an intramyocardial segment beyond the diagonal vessel; mildly calcified proximal circumflex with 20-30% narrowing followed by a 99% stenosis after a bend in the mid vessel; 50-60% mid, and 50% stenosis at the acute margin of the RCA.  LVEDP 14 mmHg.  Successful percutaneous coronary intervention to the left circumflex vessel with ultimate insertion of a 3.016 mm Synergy DES stent postdilated 3.21 mm with a 99% stenosis being reduced to 0%.  RECOMMENDATION: DAPT for a minimum of 1 year. Medical therapy for concomitant CAD.  06/2017 echo Study Conclusions  - Left ventricle: The cavity size was normal. Systolic function was normal. The estimated ejection fraction was in the range of 55% to 60%. Mild hypokinesis of the basal-midinferolateral myocardium; consistent with ischemia in the distribution of the right coronary or left circumflex coronary artery. Features are consistent with a pseudonormal left ventricular filling pattern, with concomitant abnormal relaxation and increased filling pressure (grade 2 diastolic dysfunction). - Mitral valve: Calcified annulus.  Labs/Other Tests and Data Reviewed:    EKG:  No ECG reviewed.  Recent Labs: 05/19/2019: Magnesium 2.2 05/20/2019: ALT 43 06/05/2019: BUN 7; Creatinine, Ser 0.71; Hemoglobin 11.5; Platelets 294; Potassium 4.3; Sodium 139   Recent Lipid Panel Lab Results  Component Value Date/Time   CHOL 126 07/20/2017 12:12 AM   TRIG 117 07/20/2017 12:12 AM     HDL 32 (L) 07/20/2017 12:12 AM   CHOLHDL 3.9 07/20/2017 12:12 AM   LDLCALC 71 07/20/2017 12:12 AM    Wt Readings from Last 3 Encounters:  07/16/19 165 lb 3.2 oz (74.9 kg)  07/05/19 180 lb (81.6 kg)  07/04/19 168 lb (76.2 kg)     Objective:    Vital Signs:  There were no vitals taken for this visit.   Normal affect. Normal speech pattern and tone. Comfortable, no apparent distress. No audible signs of SOB or wheezing.   ASSESSMENT & PLAN:    1. CAD - Medical therapy limited by softy bp's and bradycardia, he is now off his ACEI and beta blocker - no symptoms, continue current meds  2. Hyperlipidemia - request pcp labs, continue statin    3. Bradycardia - prior EKG with sinus brady, trifascicular block. Asymptomatic conduction disease with just mild bradycardia - no symptoms, we will continue to monitor.   COVID-19 Education: The signs and symptoms of COVID-19 were discussed with the patient and how  to seek care for testing (follow up with PCP or arrange E-visit).  The importance of social distancing was discussed today.  Time:   Today, I have spent 22 minutes with the patient with telehealth technology discussing the above problems.     Medication Adjustments/Labs and Tests Ordered: Current medicines are reviewed at length with the patient today.  Concerns regarding medicines are outlined above.   Tests Ordered: No orders of the defined types were placed in this encounter.   Medication Changes: No orders of the defined types were placed in this encounter.   Follow Up:  Either In Person or Virtual in 6 month(s)  Signed, Carlyle Dolly, MD  07/31/2019 12:37 PM    [place]

## 2019-09-19 ENCOUNTER — Other Ambulatory Visit: Payer: Self-pay | Admitting: Family Medicine

## 2019-09-19 NOTE — Telephone Encounter (Signed)
Ov 12/10/19

## 2019-12-05 ENCOUNTER — Telehealth: Payer: Self-pay | Admitting: Family Medicine

## 2019-12-05 NOTE — Telephone Encounter (Signed)
  Prescription Request  12/05/2019  What is the name of the medication or equipment?  Atorvastin 80mg   Have you contacted your pharmacy to request a refill? (if applicable) Yes  Which pharmacy would you like this sent to? [facility]   Patient notified that their request is being sent to the clinical staff for review and that they should receive a response within 2 business days.   Joyce's pt.  Please call daughter.

## 2019-12-06 MED ORDER — ATORVASTATIN CALCIUM 80 MG PO TABS: 80.0000 mg | ORAL_TABLET | Freq: Every evening | ORAL | 0 refills | Status: DC

## 2019-12-06 NOTE — Telephone Encounter (Signed)
Aware refill sent to [facility]

## 2019-12-10 ENCOUNTER — Ambulatory Visit (INDEPENDENT_AMBULATORY_CARE_PROVIDER_SITE_OTHER): Payer: Medicare Other | Admitting: Family Medicine

## 2019-12-10 ENCOUNTER — Encounter: Payer: Self-pay | Admitting: Family Medicine

## 2019-12-10 ENCOUNTER — Other Ambulatory Visit: Payer: Self-pay

## 2019-12-10 VITALS — BP 132/68 | HR 55 | Temp 97.0°F | Ht 73.0 in | Wt 164.4 lb

## 2019-12-10 DIAGNOSIS — I509 Heart failure, unspecified: Secondary | ICD-10-CM | POA: Diagnosis not present

## 2019-12-10 DIAGNOSIS — I10 Essential (primary) hypertension: Secondary | ICD-10-CM | POA: Diagnosis not present

## 2019-12-10 DIAGNOSIS — K219 Gastro-esophageal reflux disease without esophagitis: Secondary | ICD-10-CM

## 2019-12-10 DIAGNOSIS — I2510 Atherosclerotic heart disease of native coronary artery without angina pectoris: Secondary | ICD-10-CM | POA: Diagnosis not present

## 2019-12-10 DIAGNOSIS — I252 Old myocardial infarction: Secondary | ICD-10-CM

## 2019-12-10 DIAGNOSIS — J449 Chronic obstructive pulmonary disease, unspecified: Secondary | ICD-10-CM

## 2019-12-10 MED ORDER — ATORVASTATIN CALCIUM 80 MG PO TABS: 80.0000 mg | ORAL_TABLET | Freq: Every evening | ORAL | 1 refills | Status: DC

## 2019-12-10 MED ORDER — FUROSEMIDE 20 MG PO TABS: 20.0000 mg | ORAL_TABLET | Freq: Every morning | ORAL | 1 refills | Status: DC

## 2019-12-10 MED ORDER — PANTOPRAZOLE SODIUM 40 MG PO TBEC: 40.0000 mg | DELAYED_RELEASE_TABLET | Freq: Every day | ORAL | 3 refills | Status: DC

## 2019-12-10 NOTE — Progress Notes (Signed)
Assessment & Plan:  1. Essential hypertension - Well controlled on current regimen.  - CMP14+EGFR  2. Chronic congestive heart failure, unspecified heart failure type ([place]) - Well controlled on current regimen.  - furosemide (LASIX) 20 MG tablet; Take 1 tablet (20 mg total) by mouth every morning.  Dispense: 90 tablet; Refill: 1 - CMP14+EGFR  3. Coronary artery disease involving native coronary artery of native heart, angina presence unspecified - Well controlled on current regimen.  - atorvastatin (LIPITOR) 80 MG tablet; Take 1 tablet (80 mg total) by mouth every evening.  Dispense: 90 tablet; Refill: 1 - CMP14+EGFR - Lipid panel  4. History of heart attack - atorvastatin (LIPITOR) 80 MG tablet; Take 1 tablet (80 mg total) by mouth every evening.  Dispense: 90 tablet; Refill: 1 - Lipid panel  5. Chronic obstructive pulmonary disease, unspecified COPD type ([place]) - Well controlled on current regimen.   6. Gastroesophageal reflux disease, unspecified whether esophagitis present - Well controlled on current regimen.  - pantoprazole (PROTONIX) 40 MG tablet; Take 1 tablet (40 mg total) by mouth daily.  Dispense: 90 tablet; Refill: 3 - CBC with Differential/Platelet - CMP14+EGFR   Follow-up: Return in about 6 months (around 06/11/2020) for follow-up of chronic medication conditions.   Hendricks Limes, MSN, APRN, FNP-C [facility]  Subjective:  Patient ID: Paul Snyder, male    DOB: [birth year]  Age: 84 y.o. MRN: [id number]  Patient Care Team: Loman Brooklyn, FNP as PCP - General (Family Medicine) [name] Alphonse Guild, MD as PCP - Cardiology (Cardiology) [name] Alphonse Guild, MD as Consulting Physician (Cardiology)   CC:  Chief Complaint  Patient presents with   Annual Exam    HPI Paul Snyder presents for a routine follow-up of medical conditions.   Occupation: retired, Marital status: widowed, Substance use: none Diet: regular, Exercise: walking,  exercising with resistance band Last eye exam: a year ago  Last dental exam: a year ago Immunizations:  Tdap Vaccine: up to date  Shingrix Vaccine: up to date  COVID-19 Vaccine: up to date Pneumonia Vaccine: up to date  Advanced Directives Patient does have advanced directives including living will and healthcare power of attorney. He does not have a copy in the electronic medical record.   DEPRESSION SCREENING PHQ 2/9 Scores 12/10/2019 07/10/2019 06/11/2019 01/03/2019 12/13/2017 08/24/2017  PHQ - 2 Score 0 0 0 0 0 0  PHQ- 9 Score - - 0 0 0 0  Exception Documentation - - - - - (No Data)     Review of Systems  Constitutional: Negative for chills, fever, malaise/fatigue and weight loss.  HENT: Negative for congestion, ear discharge, ear pain, nosebleeds, sinus pain, sore throat and tinnitus.   Eyes: Negative for blurred vision, double vision, pain, discharge and redness.  Respiratory: Negative for cough, shortness of breath and wheezing.   Cardiovascular: Negative for chest pain, palpitations and leg swelling.  Gastrointestinal: Negative for abdominal pain, constipation, diarrhea, heartburn, nausea and vomiting.  Genitourinary: Negative for dysuria, frequency and urgency.       Denies trouble initiating a urine stream, weak stream, split stream, and dribbling.   Musculoskeletal: Negative for myalgias.  Skin: Negative for rash.  Neurological: Negative for dizziness, seizures, weakness and headaches.  Psychiatric/Behavioral: Negative for depression, substance abuse and suicidal ideas. The patient is not nervous/anxious.     Current Outpatient Medications:    acetaminophen (TYLENOL) 325 MG tablet, Take 2 tablets (650 mg total) by mouth every 6 (six) hours  as needed for mild pain (or Fever >/= 101)., Disp: 12 tablet, Rfl: 0   aspirin 81 MG EC tablet, Take 1 tablet (81 mg total) by mouth daily with breakfast., Disp: 30 tablet, Rfl: 12   atorvastatin (LIPITOR) 80 MG tablet, Take 1 tablet (80  mg total) by mouth every evening., Disp: 90 tablet, Rfl: 0   furosemide (LASIX) 20 MG tablet, Take 1 tablet (20 mg total) by mouth every morning., Disp: 90 tablet, Rfl: 1   meclizine (ANTIVERT) 12.5 MG tablet, Take 12.5 mg by mouth daily as needed for dizziness. , Disp: , Rfl: 1   niacin 500 MG tablet, Take 500 mg by mouth every morning. , Disp: , Rfl:    pantoprazole (PROTONIX) 40 MG tablet, Take 1 tablet by mouth once daily, Disp: 90 tablet, Rfl: 0  No Known Allergies  Past Medical History:  Diagnosis Date   CHF (congestive heart failure) ([facility])    COPD (chronic obstructive pulmonary disease) ([facility])    GERD (gastroesophageal reflux disease)    Lung cancer ([place]) 2009   NSTEMI (non-ST elevated myocardial infarction) ([place]) 07/16/2017   2/19 PCI/DES x1 to Lcx with moderate (50%) in the RCA, mild (20%) in LAD    Past Surgical History:  Procedure Laterality Date   ANKLE FRACTURE SURGERY Right 1970s   CATARACT EXTRACTION Left    CORONARY ANGIOPLASTY WITH STENT PLACEMENT  07/19/2017   CORONARY STENT INTERVENTION N/A 07/19/2017   Procedure: CORONARY STENT INTERVENTION;  Surgeon: [name], MD;  Location: [facility];  Service: Cardiovascular;  Laterality: N/A;   LEFT HEART CATH AND CORONARY ANGIOGRAPHY N/A 07/19/2017   Procedure: LEFT HEART CATH AND CORONARY ANGIOGRAPHY;  Surgeon: [name], MD;  Location: [facility];  Service: Cardiovascular;  Laterality: N/A;   LUNG BIOPSY      Family History  Problem Relation Age of Onset   Healthy Mother    Breast cancer Sister    Heart disease Brother    Heart failure Brother    Breast cancer Sister    Hypertension Son    Hyperlipidemia Son     Social History   Socioeconomic History   Marital status: Widowed    Spouse name: Not on file   Number of children: 2   Years of education: 7   Highest education level: 7th grade  Occupational History   Occupation: retired  Tobacco Use    Smoking status: Former Smoker    Packs/day: 1.00    Years: 42.00    Pack years: 42.00    Types: Cigarettes    Quit date: 1996    Years since quitting: 25.5   Smokeless tobacco: Never Used  Scientific laboratory technician Use: Never used  Substance and Sexual Activity   Alcohol use: No   Drug use: Not Currently   Sexual activity: Not Currently    Birth control/protection: None  Other Topics Concern   Not on file  Social History Narrative   Lives by himself   Social Determinants of Health   Financial Resource Strain: Low Risk    Difficulty of Paying Living Expenses: Not hard at all  Food Insecurity: No Food Insecurity   Worried About Charity fundraiser in the Last Year: Never true   Arboriculturist in the Last Year: Never true  Transportation Needs: No Transportation Needs   Lack of Transportation (Medical): No   Lack of Transportation (Non-Medical): No  Physical Activity: Inactive  Days of Exercise per Week: 0 days   Minutes of Exercise per Session: 0 min  Stress: No Stress Concern Present   Feeling of Stress : Not at all  Social Connections: Moderately Integrated   Frequency of Communication with Friends and Family: More than three times a week   Frequency of Social Gatherings with Friends and Family: More than three times a week   Attends Religious Services: More than 4 times per year   Active Member of [employer] or Organizations: Yes   Attends Archivist Meetings: More than 4 times per year   Marital Status: Widowed  Human resources officer Violence: Not At Risk   Fear of Current or Ex-Partner: No   Emotionally Abused: No   Physically Abused: No   Sexually Abused: No      Objective:    BP 132/68    Pulse (!) 55    Temp (!) 97 F (36.1 C) (Temporal)    Ht 6' 1"  (1.854 m)    Wt 164 lb 6.4 oz (74.6 kg)    SpO2 96%    BMI 21.69 kg/m   Wt Readings from Last 3 Encounters:  12/10/19 164 lb 6.4 oz (74.6 kg)  07/31/19 165 lb (74.8 kg)  07/16/19 165 lb  3.2 oz (74.9 kg)    Physical Exam Vitals reviewed.  Constitutional:      General: He is not in acute distress.    Appearance: Normal appearance. He is not ill-appearing, toxic-appearing or diaphoretic.  HENT:     Head: Normocephalic and atraumatic.     Right Ear: Tympanic membrane, ear canal and external ear normal. There is no impacted cerumen.     Left Ear: Tympanic membrane, ear canal and external ear normal. There is no impacted cerumen.     Nose: Nose normal. No congestion or rhinorrhea.     Mouth/Throat:     Mouth: Mucous membranes are moist.     Pharynx: Oropharynx is clear. No oropharyngeal exudate or posterior oropharyngeal erythema.  Eyes:     General: No scleral icterus.       Right eye: No discharge.        Left eye: No discharge.     Conjunctiva/sclera: Conjunctivae normal.     Pupils: Pupils are equal, round, and reactive to light.  Cardiovascular:     Rate and Rhythm: Normal rate and regular rhythm.     Heart sounds: Normal heart sounds. No murmur heard.  No friction rub. No gallop.   Pulmonary:     Effort: Pulmonary effort is normal. No respiratory distress.     Breath sounds: Normal breath sounds. No stridor. No wheezing, rhonchi or rales.  Abdominal:     General: Abdomen is flat. Bowel sounds are normal. There is no distension.     Palpations: Abdomen is soft. There is no mass.     Tenderness: There is no abdominal tenderness. There is no guarding or rebound.     Hernia: No hernia is present.  Musculoskeletal:        General: Normal range of motion.     Cervical back: Normal range of motion and neck supple. No rigidity. No muscular tenderness.     Right lower leg: No edema.     Left lower leg: No edema.  Lymphadenopathy:     Cervical: No cervical adenopathy.  Skin:    General: Skin is warm and dry.     Capillary Refill: Capillary refill takes less than 2 seconds.  Neurological:  General: No focal deficit present.     Mental Status: He is alert and  oriented to person, place, and time. Mental status is at baseline.     Gait: Gait abnormal (ambulates with cane).  Psychiatric:        Mood and Affect: Mood normal.        Behavior: Behavior normal.        Thought Content: Thought content normal.        Judgment: Judgment normal.    No results found for: TSH Lab Results  Component Value Date   WBC 8.1 06/05/2019   HGB 11.5 (L) 06/05/2019   HCT 36.4 (L) 06/05/2019   MCV 82 06/05/2019   PLT 294 06/05/2019   Lab Results  Component Value Date   NA 139 06/05/2019   K 4.3 06/05/2019   CO2 29 06/05/2019   GLUCOSE 94 06/05/2019   BUN 7 (L) 06/05/2019   CREATININE 0.71 (L) 06/05/2019   BILITOT 0.7 05/20/2019   ALKPHOS 63 05/20/2019   AST 34 05/20/2019   ALT 43 05/20/2019   PROT 5.6 (L) 05/20/2019   ALBUMIN 2.6 (L) 05/20/2019   CALCIUM 9.2 06/05/2019   ANIONGAP 10 05/20/2019   Lab Results  Component Value Date   CHOL 126 07/20/2017   Lab Results  Component Value Date   HDL 32 (L) 07/20/2017   Lab Results  Component Value Date   LDLCALC 71 07/20/2017   Lab Results  Component Value Date   TRIG 117 07/20/2017   Lab Results  Component Value Date   CHOLHDL 3.9 07/20/2017   Lab Results  Component Value Date   HGBA1C 4.8 05/16/2019

## 2019-12-10 NOTE — Patient Instructions (Signed)

## 2019-12-11 LAB — CBC WITH DIFFERENTIAL/PLATELET
Basophils Absolute: 0 10*3/uL (ref 0.0–0.2)
Basos: 1 %
EOS (ABSOLUTE): 0.1 10*3/uL (ref 0.0–0.4)
Eos: 2 %
Hematocrit: 42.2 % (ref 37.5–51.0)
Hemoglobin: 13.8 g/dL (ref 13.0–17.7)
Immature Grans (Abs): 0 10*3/uL (ref 0.0–0.1)
Immature Granulocytes: 0 %
Lymphocytes Absolute: 1 10*3/uL (ref 0.7–3.1)
Lymphs: 16 %
MCH: 28.6 pg (ref 26.6–33.0)
MCHC: 32.7 g/dL (ref 31.5–35.7)
MCV: 88 fL (ref 79–97)
Monocytes Absolute: 0.6 10*3/uL (ref 0.1–0.9)
Monocytes: 10 %
Neutrophils Absolute: 4.5 10*3/uL (ref 1.4–7.0)
Neutrophils: 71 %
Platelets: 161 10*3/uL (ref 150–450)
RBC: 4.82 x10E6/uL (ref 4.14–5.80)
RDW: 12.9 % (ref 11.6–15.4)
WBC: 6.2 10*3/uL (ref 3.4–10.8)

## 2019-12-11 LAB — LIPID PANEL
Chol/HDL Ratio: 3.1 ratio (ref 0.0–5.0)
Cholesterol, Total: 118 mg/dL (ref 100–199)
HDL: 38 mg/dL — ABNORMAL LOW (ref >39)
LDL Chol Calc (NIH): 65 mg/dL (ref 0–99)
Triglycerides: 76 mg/dL (ref 0–149)
VLDL Cholesterol Cal: 15 mg/dL (ref 5–40)

## 2019-12-11 LAB — CMP14+EGFR
ALT: 8 IU/L (ref 0–44)
AST: 14 IU/L (ref 0–40)
Albumin/Globulin Ratio: 1.8 (ref 1.2–2.2)
Albumin: 4.2 g/dL (ref 3.6–4.6)
Alkaline Phosphatase: 88 IU/L (ref 48–121)
BUN/Creatinine Ratio: 18 (ref 10–24)
BUN: 18 mg/dL (ref 8–27)
Bilirubin Total: 0.6 mg/dL (ref 0.0–1.2)
CO2: 28 mmol/L (ref 20–29)
Calcium: 9.5 mg/dL (ref 8.6–10.2)
Chloride: 100 mmol/L (ref 96–106)
Creatinine, Ser: 0.98 mg/dL (ref 0.76–1.27)
GFR calc Af Amer: 83 mL/min/{1.73_m2} (ref >59)
GFR calc non Af Amer: 72 mL/min/{1.73_m2} (ref >59)
Globulin, Total: 2.3 g/dL (ref 1.5–4.5)
Glucose: 94 mg/dL (ref 65–99)
Potassium: 4.1 mmol/L (ref 3.5–5.2)
Sodium: 141 mmol/L (ref 134–144)
Total Protein: 6.5 g/dL (ref 6.0–8.5)

## 2019-12-18 ENCOUNTER — Telehealth: Payer: Self-pay | Admitting: Cardiology

## 2019-12-18 NOTE — Telephone Encounter (Signed)
  Patient Consent for Virtual Visit         Paul Snyder has provided verbal consent on 12/18/2019 for a virtual visit (video or telephone).   CONSENT FOR VIRTUAL VISIT FOR:  Paul Snyder  By participating in this virtual visit I agree to the following:  I hereby voluntarily request, consent and authorize [facility] and its employed or contracted physicians, physician assistants, nurse practitioners or other licensed health care professionals (the Practitioner), to provide me with telemedicine health care services (the "Services") as deemed necessary by the treating Practitioner. I acknowledge and consent to receive the Services by the Practitioner via telemedicine. I understand that the telemedicine visit will involve communicating with the Practitioner through live audiovisual communication technology and the disclosure of certain medical information by electronic transmission. I acknowledge that I have been given the opportunity to request an in-person assessment or other available alternative prior to the telemedicine visit and am voluntarily participating in the telemedicine visit.  I understand that I have the right to withhold or withdraw my consent to the use of telemedicine in the course of my care at any time, without affecting my right to future care or treatment, and that the Practitioner or I may terminate the telemedicine visit at any time. I understand that I have the right to inspect all information obtained and/or recorded in the course of the telemedicine visit and may receive copies of available information for a reasonable fee.  I understand that some of the potential risks of receiving the Services via telemedicine include:  Marland Kitchen Delay or interruption in medical evaluation due to technological equipment failure or disruption; . Information transmitted may not be sufficient (e.g. poor resolution of images) to allow for appropriate medical decision making by the Practitioner;  and/or  . In rare instances, security protocols could fail, causing a breach of personal health information.  Furthermore, I acknowledge that it is my responsibility to provide information about my medical history, conditions and care that is complete and accurate to the best of my ability. I acknowledge that Practitioner's advice, recommendations, and/or decision may be based on factors not within their control, such as incomplete or inaccurate data provided by me or distortions of diagnostic images or specimens that may result from electronic transmissions. I understand that the practice of medicine is not an exact science and that Practitioner makes no warranties or guarantees regarding treatment outcomes. I acknowledge that a copy of this consent can be made available to me via my patient portal ([facility]), or I can request a printed copy by calling the office of [facility].    I understand that my insurance will be billed for this visit.   I have read or had this consent read to me. . I understand the contents of this consent, which adequately explains the benefits and risks of the Services being provided via telemedicine.  . I have been provided ample opportunity to ask questions regarding this consent and the Services and have had my questions answered to my satisfaction. . I give my informed consent for the services to be provided through the use of telemedicine in my medical care

## 2020-02-03 ENCOUNTER — Telehealth: Payer: Medicare Other | Admitting: Cardiology

## 2020-02-26 ENCOUNTER — Other Ambulatory Visit: Payer: Self-pay

## 2020-02-26 ENCOUNTER — Telehealth (INDEPENDENT_AMBULATORY_CARE_PROVIDER_SITE_OTHER): Payer: Medicare Other | Admitting: Cardiology

## 2020-02-26 VITALS — Ht 72.0 in | Wt 170.0 lb

## 2020-02-26 DIAGNOSIS — R001 Bradycardia, unspecified: Secondary | ICD-10-CM

## 2020-02-26 DIAGNOSIS — E782 Mixed hyperlipidemia: Secondary | ICD-10-CM

## 2020-02-26 DIAGNOSIS — I2510 Atherosclerotic heart disease of native coronary artery without angina pectoris: Secondary | ICD-10-CM | POA: Diagnosis not present

## 2020-02-26 NOTE — Progress Notes (Signed)
Virtual Visit via Telephone Note   This visit type was conducted due to national recommendations for restrictions regarding the COVID-19 Pandemic (e.g. social distancing) in an effort to limit this patient's exposure and mitigate transmission in our community.  Due to his co-morbid illnesses, this patient is at least at moderate risk for complications without adequate follow up.  This format is felt to be most appropriate for this patient at this time.  The patient did not have access to video technology/had technical difficulties with video requiring transitioning to audio format only (telephone).  All issues noted in this document were discussed and addressed.  No physical exam could be performed with this format.  Please refer to the patient's chart for his  consent to telehealth for [facility].    Date:  02/26/2020   ID:  Paul Snyder, DOB [birth year], MRN [id number] The patient was identified using 2 identifiers.  Patient Location: Home Provider Location: Office/Clinic  PCP:  Loman Brooklyn, FNP  Cardiologist:  Carlyle Dolly, MD  Electrophysiologist:  None   Evaluation Performed:  Follow-Up Visit  Chief Complaint:  Follow up  History of Present Illness:    Paul Snyder is a 83 y.o. male seen today for follow up of the following medical problems.  1. CAD - admitted with NSTEMI 06/2017. Cath as reported below, received DES to LCX. - 06/2017 echo VLEF 55-60%, grade II diastolic dysfunciton - lopressor stopped due to bradycardia, down to 40s during cardiac rehab. -  Lisinopril stopped ACEI due to low bp's by pcp.   - no chest pains. No SOB/DOE - compliant with meds   2. HTN - more recent issues with low bp's, resolved off bp meds   3. Hyperlipidemia -compliant with statin, labs followed by pcp  11/2019 TC 118 TG 76 HDL 38 LDL 65  4. COPD - followed by pcp  5. Conduction disease/bradycardia - no recent symptoms.    COVID vaccine x 2.   The  patient does not have symptoms concerning for COVID-19 infection (fever, chills, cough, or new shortness of breath).    Past Medical History:  Diagnosis Date  . CHF (congestive heart failure) ([place])   . COPD (chronic obstructive pulmonary disease) ([place])   . GERD (gastroesophageal reflux disease)   . Lung cancer ([place]) 2009  . NSTEMI (non-ST elevated myocardial infarction) ([place]) 07/16/2017   2/19 PCI/DES x1 to Lcx with moderate (50%) in the RCA, mild (20%) in LAD   Past Surgical History:  Procedure Laterality Date  . ANKLE FRACTURE SURGERY Right 1970s  . CATARACT EXTRACTION Left   . CORONARY ANGIOPLASTY WITH STENT PLACEMENT  07/19/2017  . CORONARY STENT INTERVENTION N/A 07/19/2017   Procedure: CORONARY STENT INTERVENTION;  Surgeon: [name], MD;  Location: [place] [facility];  Service: Cardiovascular;  Laterality: N/A;  . LEFT HEART CATH AND CORONARY ANGIOGRAPHY N/A 07/19/2017   Procedure: LEFT HEART CATH AND CORONARY ANGIOGRAPHY;  Surgeon: [name], MD;  Location: [place] [facility];  Service: Cardiovascular;  Laterality: N/A;  . LUNG BIOPSY       Current Meds  Medication Sig  . acetaminophen (TYLENOL) 325 MG tablet Take 2 tablets (650 mg total) by mouth every 6 (six) hours as needed for mild pain (or Fever >/= 101).  Marland Kitchen aspirin 81 MG EC tablet Take 1 tablet (81 mg total) by mouth daily with breakfast.  . atorvastatin (LIPITOR) 80 MG tablet Take 1 tablet (80 mg total) by mouth every  evening.  . furosemide (LASIX) 20 MG tablet Take 1 tablet (20 mg total) by mouth every morning.  . niacin 500 MG tablet Take 500 mg by mouth every morning.   . pantoprazole (PROTONIX) 40 MG tablet Take 1 tablet (40 mg total) by mouth daily.     Allergies:   Patient has no known allergies.   Social History   Tobacco Use  . Smoking status: Former Smoker    Packs/day: 1.00    Years: 42.00    Pack years: 42.00    Types: Cigarettes    Quit date: 1996    Years since quitting: 25.7  .  Smokeless tobacco: Never Used  Vaping Use  . Vaping Use: Never used  Substance Use Topics  . Alcohol use: No  . Drug use: Not Currently     Family Hx: The patient's family history includes Breast cancer in his sister and sister; Healthy in his mother; Heart disease in his brother; Heart failure in his brother; Hyperlipidemia in his son; Hypertension in his son.  ROS:   Please see the history of present illness.     All other systems reviewed and are negative.   Prior CV studies:   The following studies were reviewed today:  06/2017 cath  Prox Cx lesion is 30% stenosed.  Prox RCA to Mid RCA lesion is 55% stenosed.  Mid RCA lesion is 50% stenosed.  Prox LAD to Mid LAD lesion is 20% stenosed.  A stent was successfully placed.  Post intervention, there is a 0% residual stenosis.  Mid Cx lesion is 99% stenosed.  Multi vessel CAD with proximal irregularity of the LAD, 20% narrowing of the LAD in an intramyocardial segment beyond the diagonal vessel; mildly calcified proximal circumflex with 20-30% narrowing followed by a 99% stenosis after a bend in the mid vessel; 50-60% mid, and 50% stenosis at the acute margin of the RCA.  LVEDP 14 mmHg.  Successful percutaneous coronary intervention to the left circumflex vessel with ultimate insertion of a 3.016 mm Synergy DES stent postdilated 3.21 mm with a 99% stenosis being reduced to 0%.  RECOMMENDATION: DAPT for a minimum of 1 year. Medical therapy for concomitant CAD.  06/2017 echo Study Conclusions  - Left ventricle: The cavity size was normal. Systolic function was normal. The estimated ejection fraction was in the range of 55% to 60%. Mild hypokinesis of the basal-midinferolateral myocardium; consistent with ischemia in the distribution of the right coronary or left circumflex coronary artery. Features are consistent with a pseudonormal left ventricular filling pattern, with concomitant abnormal  relaxation and increased filling pressure (grade 2 diastolic dysfunction). - Mitral valve: Calcified annulus.  Labs/Other Tests and Data Reviewed:    EKG:  No ECG reviewed.  Recent Labs: 05/19/2019: Magnesium 2.2 12/10/2019: ALT 8; BUN 18; Creatinine, Ser 0.98; Hemoglobin 13.8; Platelets 161; Potassium 4.1; Sodium 141   Recent Lipid Panel Lab Results  Component Value Date/Time   CHOL 118 12/10/2019 08:46 AM   TRIG 76 12/10/2019 08:46 AM   HDL 38 (L) 12/10/2019 08:46 AM   CHOLHDL 3.1 12/10/2019 08:46 AM   CHOLHDL 3.9 07/20/2017 12:12 AM   LDLCALC 65 12/10/2019 08:46 AM    Wt Readings from Last 3 Encounters:  12/10/19 164 lb 6.4 oz (74.6 kg)  07/31/19 165 lb (74.8 kg)  07/16/19 165 lb 3.2 oz (74.9 kg)     Objective:    Vital Signs:   Today's Vitals   02/26/20 0912  Weight: 170 lb (77.1 kg)  Height: 6' (1.829 m)   Body mass index is 23.06 kg/m. Normal affect. Normal speech pattern and tone. Comfortable  ASSESSMENT & PLAN:    1. CAD - Medical therapy limited by softy bp's and bradycardia, he is now off his ACEI and beta blocker - no recent symptoms, continue aspirin and statin  2. Hyperlipidemia - at goal, continue statin.     3. Bradycardia - prior EKG withsinus brady, trifascicular block. Asymptomatic conduction disease with just mild bradycardia - remains asymptomatic, continue to monitor.    COVID-19 Education: The signs and symptoms of COVID-19 were discussed with the patient and how to seek care for testing (follow up with PCP or arrange E-visit).  The importance of social distancing was discussed today.  Time:   Today, I have spent 16 minutes with the patient with telehealth technology discussing the above problems.     Medication Adjustments/Labs and Tests Ordered: Current medicines are reviewed at length with the patient today.  Concerns regarding medicines are outlined above.   Tests Ordered: No orders of the defined types were placed  in this encounter.   Medication Changes: No orders of the defined types were placed in this encounter.   Follow Up:  In Person in 6 month(s)  Signed, Carlyle Dolly, MD  02/26/2020 9:13 AM    [place]

## 2020-02-26 NOTE — Patient Instructions (Signed)
Medication Instructions:  Your physician recommends that you continue on your current medications as directed. Please refer to the Current Medication list given to you today.  *If you need a refill on your cardiac medications before your next appointment, please call your pharmacy*   Lab Work: NONE   If you have labs (blood work) drawn today and your tests are completely normal, you will receive your results only by: . MyChart Message (if you have MyChart) OR . A paper copy in the mail If you have any lab test that is abnormal or we need to change your treatment, we will call you to review the results.   Testing/Procedures: NONE    Follow-Up: At [facility], you and your health needs are our priority.  As part of our continuing mission to provide you with exceptional heart care, we have created designated Provider Care Teams.  These Care Teams include your primary Cardiologist (physician) and Advanced Practice Providers (APPs -  Physician Assistants and Nurse Practitioners) who all work together to provide you with the care you need, when you need it.  We recommend signing up for the patient portal called "MyChart".  Sign up information is provided on this After Visit Summary.  MyChart is used to connect with patients for Virtual Visits (Telemedicine).  Patients are able to view lab/test results, encounter notes, upcoming appointments, etc.  Non-urgent messages can be sent to your provider as well.   To learn more about what you can do with MyChart, go to [email or web address].    Your next appointment:   6 month(s)  The format for your next appointment:   In Person  Provider:   Jonathan Branch, MD   Other Instructions Thank you for choosing [facility]!    

## 2020-04-24 ENCOUNTER — Other Ambulatory Visit: Payer: Self-pay

## 2020-04-24 ENCOUNTER — Encounter: Payer: Self-pay | Admitting: Nurse Practitioner

## 2020-04-24 ENCOUNTER — Ambulatory Visit (INDEPENDENT_AMBULATORY_CARE_PROVIDER_SITE_OTHER): Payer: Medicare Other | Admitting: Nurse Practitioner

## 2020-04-24 ENCOUNTER — Ambulatory Visit (INDEPENDENT_AMBULATORY_CARE_PROVIDER_SITE_OTHER): Payer: Medicare Other

## 2020-04-24 VITALS — BP 113/67 | HR 61 | Temp 97.4°F | Ht 72.0 in | Wt 163.4 lb

## 2020-04-24 DIAGNOSIS — J449 Chronic obstructive pulmonary disease, unspecified: Secondary | ICD-10-CM

## 2020-04-24 DIAGNOSIS — I10 Essential (primary) hypertension: Secondary | ICD-10-CM | POA: Diagnosis not present

## 2020-04-24 DIAGNOSIS — R062 Wheezing: Secondary | ICD-10-CM | POA: Insufficient documentation

## 2020-04-24 DIAGNOSIS — W19XXXA Unspecified fall, initial encounter: Secondary | ICD-10-CM

## 2020-04-24 DIAGNOSIS — Z9181 History of falling: Secondary | ICD-10-CM

## 2020-04-24 DIAGNOSIS — I509 Heart failure, unspecified: Secondary | ICD-10-CM | POA: Diagnosis not present

## 2020-04-24 DIAGNOSIS — R531 Weakness: Secondary | ICD-10-CM

## 2020-04-24 NOTE — Progress Notes (Signed)
Established Patient Office Visit  Subjective:  Patient ID: Paul Snyder, male    DOB: [birth year]  Age: 84 y.o. MRN: [id number]  CC:  Chief Complaint  Patient presents with  . Extremity Weakness    HPI [name] presents for ongoing bilateral lower extremity weakness.  This is not new for patient.  Patient is experiencing falls due to leg walking on the patient several times a week in the last few months.  Patient is not reporting any pain, fever, nausea vomiting or dizziness.  Patient has a history of hypertension and COPD.  Blood pressure is within patient's limit.  Patient is also reporting congestion and shortness of breath.  Pt presents for follow up of hypertension. Patient was diagnosed in 2019 the patient is tolerating the medication well without side effects. Compliance with treatment has been good; including taking medication as directed , maintains a healthy diet and regular exercise regimen , and following up as directed.     Past Medical History:  Diagnosis Date  . CHF (congestive heart failure) ([place])   . COPD (chronic obstructive pulmonary disease) ([place])   . GERD (gastroesophageal reflux disease)   . Lung cancer ([place]) 2009  . NSTEMI (non-ST elevated myocardial infarction) ([place]) 07/16/2017   2/19 PCI/DES x1 to Lcx with moderate (50%) in the RCA, mild (20%) in LAD    Past Surgical History:  Procedure Laterality Date  . ANKLE FRACTURE SURGERY Right 1970s  . CATARACT EXTRACTION Left   . CORONARY ANGIOPLASTY WITH STENT PLACEMENT  07/19/2017  . CORONARY STENT INTERVENTION N/A 07/19/2017   Procedure: CORONARY STENT INTERVENTION;  Surgeon: [name], MD;  Location: [place] [facility];  Service: Cardiovascular;  Laterality: N/A;  . LEFT HEART CATH AND CORONARY ANGIOGRAPHY N/A 07/19/2017   Procedure: LEFT HEART CATH AND CORONARY ANGIOGRAPHY;  Surgeon: [name], MD;  Location: [place] [facility];  Service: Cardiovascular;  Laterality: N/A;  . LUNG BIOPSY       Family History  Problem Relation Age of Onset  . Healthy Mother   . Breast cancer Sister   . Heart disease Brother   . Heart failure Brother   . Breast cancer Sister   . Hypertension Son   . Hyperlipidemia Son     Social History   Socioeconomic History  . Marital status: Widowed    Spouse name: Not on file  . Number of children: 2  . Years of education: 7  . Highest education level: 7th grade  Occupational History  . Occupation: retired  Tobacco Use  . Smoking status: Former Smoker    Packs/day: 1.00    Years: 42.00    Pack years: 42.00    Types: Cigarettes    Quit date: 1996    Years since quitting: 25.9  . Smokeless tobacco: Never Used  Vaping Use  . Vaping Use: Never used  Substance and Sexual Activity  . Alcohol use: No  . Drug use: Not Currently  . Sexual activity: Not Currently    Birth control/protection: None  Other Topics Concern  . Not on file  Social History Narrative   Lives by himself   Social Determinants of Health   Financial Resource Strain: Low Risk   . Difficulty of Paying Living Expenses: Not hard at all  Food Insecurity: No Food Insecurity  . Worried About Charity fundraiser in the Last Year: Never true  . Ran Out of Food in the Last Year: Never true  Transportation Needs: No Transportation Needs  . Lack of Transportation (Medical): No  . Lack of Transportation (Non-Medical): No  Physical Activity: Inactive  . Days of Exercise per Week: 0 days  . Minutes of Exercise per Session: 0 min  Stress: No Stress Concern Present  . Feeling of Stress : Not at all  Social Connections: Moderately Integrated  . Frequency of Communication with Friends and Family: More than three times a week  . Frequency of Social Gatherings with Friends and Family: More than three times a week  . Attends Religious Services: More than 4 times per year  . Active Member of Clubs or Organizations: Yes  . Attends Archivist Meetings: More than 4 times  per year  . Marital Status: Widowed  Intimate Partner Violence: Not At Risk  . Fear of Current or Ex-Partner: No  . Emotionally Abused: No  . Physically Abused: No  . Sexually Abused: No    Outpatient Medications Prior to Visit  Medication Sig Dispense Refill  . acetaminophen (TYLENOL) 325 MG tablet Take 2 tablets (650 mg total) by mouth every 6 (six) hours as needed for mild pain (or Fever >/= 101). 12 tablet 0  . aspirin 81 MG EC tablet Take 1 tablet (81 mg total) by mouth daily with breakfast. 30 tablet 12  . atorvastatin (LIPITOR) 80 MG tablet Take 1 tablet (80 mg total) by mouth every evening. 90 tablet 1  . furosemide (LASIX) 20 MG tablet Take 1 tablet (20 mg total) by mouth every morning. 90 tablet 1  . niacin 500 MG tablet Take 500 mg by mouth every morning.     . pantoprazole (PROTONIX) 40 MG tablet Take 1 tablet (40 mg total) by mouth daily. 90 tablet 3   No facility-administered medications prior to visit.    No Known Allergies  ROS Review of Systems  Respiratory: Positive for cough, shortness of breath and wheezing.   All other systems reviewed and are negative.     Objective:    Physical Exam Vitals reviewed. Exam conducted with a chaperone present.  Constitutional:      Appearance: Normal appearance.  HENT:     Head: Normocephalic.     Nose: Congestion present.  Eyes:     Conjunctiva/sclera: Conjunctivae normal.  Cardiovascular:     Rate and Rhythm: Normal rate and regular rhythm.     Pulses: Normal pulses.     Heart sounds: Normal heart sounds.  Pulmonary:     Effort: Pulmonary effort is normal.     Breath sounds: Wheezing present.  Abdominal:     General: Bowel sounds are normal.  Neurological:     Mental Status: He is alert.  Psychiatric:        Behavior: Behavior normal.     BP 113/67   Pulse 61   Temp (!) 97.4 F (36.3 C) (Temporal)   Ht 6' (1.829 m)   Wt 163 lb 6 oz (74.1 kg)   SpO2 92%   BMI 22.16 kg/m  Wt Readings from Last 3  Encounters:  04/24/20 163 lb 6 oz (74.1 kg)  02/26/20 170 lb (77.1 kg)  12/10/19 164 lb 6.4 oz (74.6 kg)     Health Maintenance Due  Topic Date Due  . COVID-19 Vaccine (1) Never done    There are no preventive care reminders to display for this patient.  No results found for: TSH Lab Results  Component Value Date   WBC 5.3 04/24/2020   HGB 13.3  04/24/2020   HCT 41.0 04/24/2020   MCV 88 04/24/2020   PLT 165 04/24/2020   Lab Results  Component Value Date   NA 139 04/24/2020   K 4.2 04/24/2020   CO2 28 04/24/2020   GLUCOSE 124 (H) 04/24/2020   BUN 19 04/24/2020   CREATININE 1.14 04/24/2020   BILITOT 0.4 04/24/2020   ALKPHOS 77 04/24/2020   AST 14 04/24/2020   ALT 9 04/24/2020   PROT 6.1 04/24/2020   ALBUMIN 3.9 04/24/2020   CALCIUM 9.5 04/24/2020   ANIONGAP 10 05/20/2019   Lab Results  Component Value Date   CHOL 118 12/10/2019   Lab Results  Component Value Date   HDL 38 (L) 12/10/2019   Lab Results  Component Value Date   LDLCALC 65 12/10/2019   Lab Results  Component Value Date   TRIG 76 12/10/2019   Lab Results  Component Value Date   CHOLHDL 3.1 12/10/2019   Lab Results  Component Value Date   HGBA1C 4.8 05/16/2019      Assessment & Plan:   Problem List Items Addressed This Visit      Cardiovascular and Mediastinum   CHF (congestive heart failure) ([place]) (Chronic)   Essential hypertension    Essential hypertension well controlled.        Respiratory   COPD (chronic obstructive pulmonary disease) ([facility])     Other   Weakness    This is not new for patient in the last few weeks patient is reporting increased weakness bilateral lower extremity.  On assessment patient has expiratory wheezing, cough and congestion.  O2 saturation 92% on room air.  Chest x-ray completed      Fall - Primary   Relevant Orders   CBC with Differential (Completed)   Comprehensive metabolic panel (Completed)   Wheezing    On assessment patient has  expiratory wheezing.  Chest x-ray completed results pending.      Relevant Orders   DG Chest 2 View (Completed)      No orders of the defined types were placed in this encounter.   Follow-up: Return if symptoms worsen or fail to improve.    Ivy Lynn, NP

## 2020-04-24 NOTE — Patient Instructions (Addendum)
Weakness Weakness is a lack of strength. You may feel weak all over your body (generalized), or you may feel weak in one part of your body (focal). There are many potential causes of weakness. Sometimes, the cause of your weakness may not be known. Some causes of weakness can be serious, so it is important to see your doctor. Follow these instructions at home: Activity  Rest as needed.  Try to get enough sleep. Most adults need 7-8 hours of sleep each night. Talk to your doctor about how much sleep you need each night.  Do exercises, such as arm curls and leg raises, for 30 minutes at least 2 days a week or as told by your doctor.  Think about working with a physical therapist or trainer to help you get stronger. General instructions   Take over-the-counter and prescription medicines only as told by your doctor.  Eat a healthy, well-balanced diet. This includes: ? Proteins to build muscles, such as lean meats and fish. ? Fresh fruits and vegetables. ? Carbohydrates to boost energy, such as whole grains.  Drink enough fluid to keep your pee (urine) pale yellow.  Keep all follow-up visits as told by your doctor. This is important. Contact a doctor if:  Your weakness does not get better or it gets worse.  Your weakness affects your ability to: ? Think clearly. ? Do your normal daily activities. Get help right away if you:  Have sudden weakness on one side of your face or body.  Have chest pain.  Have trouble breathing or shortness of breath.  Have problems with your vision.  Have trouble talking or swallowing.  Have trouble standing or walking.  Are light-headed.  Pass out (lose consciousness). Summary  Weakness is a lack of strength. You may feel weak all over your body or just in one part of your body.  There are many potential causes of weakness. Sometimes, the cause of your weakness may not be known.  Rest as needed, and try to get enough sleep. Most adults  need 7-8 hours of sleep each night.  Eat a healthy, well-balanced diet. This information is not intended to replace advice given to you by your health care provider. Make sure you discuss any questions you have with your health care provider. Document Revised: 12/13/2017 Document Reviewed: 12/13/2017 Elsevier Patient Education  [place] for Falls Each year, millions of people have serious injuries from falls. It is important to understand your risk for falling. Talk with your health care provider about your risk and what you can do to lower it. There are actions you can take at home to lower your risk. If you do have a serious fall, make sure you tell your health care provider. Falling once raises your risk for falling again. How can falls affect me? Serious injuries from falls are common. These include:  Broken bones, such as hip fractures.  Head injuries, such as traumatic brain injuries (TBI). Fear of falling can also cause you to avoid activities and stay at home. This can make your muscles weaker and actually raise your risk for a fall. What can increase my risk? There are a number of risk factors that increase your risk for falling. The more risk factors you have, the higher your risk for falling. Serious injuries from a fall most often happen to people older than age 9. Children and young adults ages 27-29 are also at higher risk. Common risk factors include:  Weakness in the lower body.  Lack (deficiency) of vitamin D.  Being generally weak or confused due to long-term (chronic) illness.  Dizziness or balance problems.  Poor vision.  Medicines that cause dizziness or drowsiness. These can include medicines for your blood pressure, heart, anxiety, insomnia, or edema, as well as pain medicines and muscle relaxants. Other risk factors include:  Drinking alcohol.  Having had a fall in the past.  Having depression.  Foot pain or improper  footwear.  Working at a dangerous job.  Having any of the following in your home: ? Tripping hazards, such as floor clutter or loose rugs. ? Poor lighting. ? Pets or clutter.  Dementia or memory loss. What actions can I take to lower my risk of falling?     Physical activity Maintain physical fitness. Do strength and balance exercises. Consider taking a regular class to build strength and balance. Yoga and tai chi are good options. Vision Have your eyes checked every year and your vision prescription updated as needed. Walking aids and footwear  Wear nonskid shoes. Do not wear high heels.  Do not walk around the house in socks or slippers.  Use a cane or walker as told by your health care provider. Home safety  Attach secure railings on both sides of your stairs.  Install grab bars for your tub, shower, and toilet. Use a bath mat in your tub or shower.  Use good lighting in all rooms. Keep a flashlight near your bed.  Make sure there is a clear path from your bed to the bathroom. Use night-lights.  Do not use throw rugs. Make sure all carpeting is taped or tacked down securely.  Remove all clutter from walkways and stairways, including extension cords.  Repair uneven or broken steps.  Avoid walking on icy or slippery surfaces. Walk on the grass instead of on icy or slick sidewalks. Where you can, use ice melt to get rid of ice on walkways.  Use a cordless phone. Questions to ask your health care provider  Can you help me check my risk for a fall?  Do any of my medicines make me more likely to fall?  Should I take a vitamin D supplement?  What exercises can I do to improve my strength and balance?  Should I make an appointment to have my vision checked?  Do I need a bone density test to check for weak bones or osteoporosis?  Would it help to use a cane or a walker? Where to find more information  Centers for Disease Control and Prevention, STEADI:  http://www.wolf.info/  Community-Based Fall Prevention Programs: http://www.wolf.info/  National Institute on Aging: ToneConnect.com.ee Contact a health care provider if:  You fall at home.  You are afraid of falling at home.  You feel weak, drowsy, or dizzy. Summary  People 54 and older are at high risk for falling. However, older people are not the only ones injured in falls. Children and young adults have a higher-than-normal risk too.  Talk with your health care provider about your risks for falling and how to lower those risks.  Taking certain precautions at home can lower your risk for falling.  If you fall, always tell your health care provider. This information is not intended to replace advice given to you by your health care provider. Make sure you discuss any questions you have with your health care provider. Document Revised: 11/14/2018 Document Reviewed: 11/14/2018 Elsevier Patient Education  [place].

## 2020-04-25 LAB — COMPREHENSIVE METABOLIC PANEL
ALT: 9 IU/L (ref 0–44)
AST: 14 IU/L (ref 0–40)
Albumin/Globulin Ratio: 1.8 (ref 1.2–2.2)
Albumin: 3.9 g/dL (ref 3.6–4.6)
Alkaline Phosphatase: 77 IU/L (ref 44–121)
BUN/Creatinine Ratio: 17 (ref 10–24)
BUN: 19 mg/dL (ref 8–27)
Bilirubin Total: 0.4 mg/dL (ref 0.0–1.2)
CO2: 28 mmol/L (ref 20–29)
Calcium: 9.5 mg/dL (ref 8.6–10.2)
Chloride: 100 mmol/L (ref 96–106)
Creatinine, Ser: 1.14 mg/dL (ref 0.76–1.27)
GFR calc Af Amer: 69 mL/min/{1.73_m2} (ref >59)
GFR calc non Af Amer: 60 mL/min/{1.73_m2} (ref >59)
Globulin, Total: 2.2 g/dL (ref 1.5–4.5)
Glucose: 124 mg/dL — ABNORMAL HIGH (ref 65–99)
Potassium: 4.2 mmol/L (ref 3.5–5.2)
Sodium: 139 mmol/L (ref 134–144)
Total Protein: 6.1 g/dL (ref 6.0–8.5)

## 2020-04-25 LAB — CBC WITH DIFFERENTIAL/PLATELET
Basophils Absolute: 0 10*3/uL (ref 0.0–0.2)
Basos: 1 %
EOS (ABSOLUTE): 0.1 10*3/uL (ref 0.0–0.4)
Eos: 1 %
Hematocrit: 41 % (ref 37.5–51.0)
Hemoglobin: 13.3 g/dL (ref 13.0–17.7)
Immature Grans (Abs): 0 10*3/uL (ref 0.0–0.1)
Immature Granulocytes: 0 %
Lymphocytes Absolute: 1.1 10*3/uL (ref 0.7–3.1)
Lymphs: 21 %
MCH: 28.4 pg (ref 26.6–33.0)
MCHC: 32.4 g/dL (ref 31.5–35.7)
MCV: 88 fL (ref 79–97)
Monocytes Absolute: 0.6 10*3/uL (ref 0.1–0.9)
Monocytes: 10 %
Neutrophils Absolute: 3.5 10*3/uL (ref 1.4–7.0)
Neutrophils: 67 %
Platelets: 165 10*3/uL (ref 150–450)
RBC: 4.68 x10E6/uL (ref 4.14–5.80)
RDW: 12.9 % (ref 11.6–15.4)
WBC: 5.3 10*3/uL (ref 3.4–10.8)

## 2020-04-25 NOTE — Assessment & Plan Note (Signed)
This is not new for patient in the last few weeks patient is reporting increased weakness bilateral lower extremity.  On assessment patient has expiratory wheezing, cough and congestion.  O2 saturation 92% on room air.  Chest x-ray completed

## 2020-04-25 NOTE — Assessment & Plan Note (Signed)
Essential hypertension well controlled.

## 2020-04-25 NOTE — Assessment & Plan Note (Signed)
On assessment patient has expiratory wheezing.  Chest x-ray completed results pending.

## 2020-06-11 ENCOUNTER — Ambulatory Visit (INDEPENDENT_AMBULATORY_CARE_PROVIDER_SITE_OTHER): Payer: Medicare Other | Admitting: Family Medicine

## 2020-06-11 ENCOUNTER — Other Ambulatory Visit: Payer: Self-pay

## 2020-06-11 ENCOUNTER — Encounter: Payer: Self-pay | Admitting: Family Medicine

## 2020-06-11 VITALS — BP 135/68 | HR 55 | Temp 96.7°F | Ht 73.0 in | Wt 164.0 lb

## 2020-06-11 DIAGNOSIS — K219 Gastro-esophageal reflux disease without esophagitis: Secondary | ICD-10-CM

## 2020-06-11 DIAGNOSIS — J449 Chronic obstructive pulmonary disease, unspecified: Secondary | ICD-10-CM

## 2020-06-11 DIAGNOSIS — I509 Heart failure, unspecified: Secondary | ICD-10-CM | POA: Diagnosis not present

## 2020-06-11 DIAGNOSIS — I2510 Atherosclerotic heart disease of native coronary artery without angina pectoris: Secondary | ICD-10-CM

## 2020-06-11 DIAGNOSIS — I10 Essential (primary) hypertension: Secondary | ICD-10-CM

## 2020-06-11 DIAGNOSIS — R29898 Other symptoms and signs involving the musculoskeletal system: Secondary | ICD-10-CM

## 2020-06-11 DIAGNOSIS — Z9181 History of falling: Secondary | ICD-10-CM

## 2020-06-11 DIAGNOSIS — I252 Old myocardial infarction: Secondary | ICD-10-CM

## 2020-06-11 MED ORDER — PANTOPRAZOLE SODIUM 40 MG PO TBEC: 40.0000 mg | DELAYED_RELEASE_TABLET | Freq: Every day | ORAL | 1 refills | Status: DC

## 2020-06-11 MED ORDER — ATORVASTATIN CALCIUM 80 MG PO TABS: 80.0000 mg | ORAL_TABLET | Freq: Every evening | ORAL | 1 refills | Status: DC

## 2020-06-11 MED ORDER — FUROSEMIDE 20 MG PO TABS: 20.0000 mg | ORAL_TABLET | Freq: Every morning | ORAL | 1 refills | Status: DC

## 2020-06-11 NOTE — Progress Notes (Signed)
Assessment & Plan:  1. Essential hypertension - Well controlled on current regimen.   2. Chronic congestive heart failure, unspecified heart failure type ([place]) - Well controlled on current regimen.  - furosemide (LASIX) 20 MG tablet; Take 1 tablet (20 mg total) by mouth every morning.  Dispense: 90 tablet; Refill: 1  3. Coronary artery disease involving native coronary artery of native heart - Well controlled on current regimen.  - atorvastatin (LIPITOR) 80 MG tablet; Take 1 tablet (80 mg total) by mouth every evening.  Dispense: 90 tablet; Refill: 1  4. History of heart attack - Well controlled on current regimen.  - atorvastatin (LIPITOR) 80 MG tablet; Take 1 tablet (80 mg total) by mouth every evening.  Dispense: 90 tablet; Refill: 1  5. Chronic obstructive pulmonary disease, unspecified COPD type ([place]) - Discussed use of albuterol inhaler if he is short of breath, wheezing, or feeling tight in the chest. Advised that if he finds he is using his inhaler frequently he needs to let me know so that a maintenance inhaler can be prescribed. Education provided on COPD.  6. Gastroesophageal reflux disease, unspecified whether esophagitis present - Well controlled on current regimen.  - pantoprazole (PROTONIX) 40 MG tablet; Take 1 tablet (40 mg total) by mouth daily.  Dispense: 90 tablet; Refill: 1  7. Weakness of both lower extremities - Discussed with patient I want him to participate in physical therapy and let them decide when he is done, not send them away when he thinks he is done. - Ambulatory referral to Home Health  8. History of fall - Ambulatory referral to [facility]   Return in about 6 months (around 12/09/2020) for follow-up of chronic medication conditions.  Hendricks Limes, MSN, APRN, FNP-C [facility]  Subjective:    Patient ID: Paul Snyder, male    DOB: 1936-02-06, 85 y.o.   MRN: [id number]  Patient Care Team: Loman Brooklyn, FNP as PCP  - General (Family Medicine) [name] Alphonse Guild, MD as PCP - Cardiology (Cardiology) [name] Alphonse Guild, MD as Consulting Physician (Cardiology)   Chief Complaint:  Chief Complaint  Patient presents with  . Hypertension    6 month check up of chronic medical conditions     HPI: Paul Snyder is a 85 y.o. male presenting on 06/11/2020 for Hypertension (6 month check up of chronic medical conditions/)  Patient is accompanied by his son who he is okay with being present.  Hypertension: Patient tolerating medications well.  Denies any chest pain or dizziness.  He does not check his blood pressure at home but does have a machine if he needs to.  CHF: Patient denies any shortness of breath or chest pain.  He does not weigh himself at home, but does have a scale if he needs to.  COPD: Patient reports he does have an albuterol inhaler at home but hardly ever needs to use it.  His son reports that he does frequently have wheezing but does not use the inhaler.  He denies any shortness of breath or tightness in the chest.  GERD: Controlled with Protonix.  Patient reports he has had the COVID-19 vaccines and the booster at [facility], but does not have the dates.   New complaints: Patient was seen a little over a month ago due to weakness which resulted in a fall.  Patient continues to report bilateral lower extremity weakness.  He has not had any further falls since his last visit.  He did do physical therapy in the past, about 6 months ago.  His son reports that they were not done working with him but the patient told him they did not need to keep coming as he was doing okay.   Social history:  Relevant past medical, surgical, family and social history reviewed and updated as indicated. Interim medical history since our last visit reviewed.  Allergies and medications reviewed and updated.  DATA REVIEWED: CHART IN EPIC  ROS: Negative unless specifically indicated above in HPI.     Current Outpatient Medications:  .  acetaminophen (TYLENOL) 325 MG tablet, Take 2 tablets (650 mg total) by mouth every 6 (six) hours as needed for mild pain (or Fever >/= 101)., Disp: 12 tablet, Rfl: 0 .  aspirin 81 MG EC tablet, Take 1 tablet (81 mg total) by mouth daily with breakfast., Disp: 30 tablet, Rfl: 12 .  niacin 500 MG tablet, Take 500 mg by mouth every morning. , Disp: , Rfl:  .  atorvastatin (LIPITOR) 80 MG tablet, Take 1 tablet (80 mg total) by mouth every evening., Disp: 90 tablet, Rfl: 1 .  furosemide (LASIX) 20 MG tablet, Take 1 tablet (20 mg total) by mouth every morning., Disp: 90 tablet, Rfl: 1 .  pantoprazole (PROTONIX) 40 MG tablet, Take 1 tablet (40 mg total) by mouth daily., Disp: 90 tablet, Rfl: 1   No Known Allergies Past Medical History:  Diagnosis Date  . CHF (congestive heart failure) ([place])   . COPD (chronic obstructive pulmonary disease) ([place])   . GERD (gastroesophageal reflux disease)   . Lung cancer ([place]) 2009  . NSTEMI (non-ST elevated myocardial infarction) ([place]) 07/16/2017   2/19 PCI/DES x1 to Lcx with moderate (50%) in the RCA, mild (20%) in LAD    Past Surgical History:  Procedure Laterality Date  . ANKLE FRACTURE SURGERY Right 1970s  . CATARACT EXTRACTION Left   . CORONARY ANGIOPLASTY WITH STENT PLACEMENT  07/19/2017  . CORONARY STENT INTERVENTION N/A 07/19/2017   Procedure: CORONARY STENT INTERVENTION;  Surgeon: [name], MD;  Location: [facility];  Service: Cardiovascular;  Laterality: N/A;  . LEFT HEART CATH AND CORONARY ANGIOGRAPHY N/A 07/19/2017   Procedure: LEFT HEART CATH AND CORONARY ANGIOGRAPHY;  Surgeon: [name], MD;  Location: [facility];  Service: Cardiovascular;  Laterality: N/A;  . LUNG BIOPSY      Social History   Socioeconomic History  . Marital status: Widowed    Spouse name: Not on file  . Number of children: 2  . Years of education: 7  . Highest education level: 7th grade  Occupational  History  . Occupation: retired  Tobacco Use  . Smoking status: Former Smoker    Packs/day: 1.00    Years: 42.00    Pack years: 42.00    Types: Cigarettes    Quit date: 1996    Years since quitting: 26.0  . Smokeless tobacco: Never Used  Vaping Use  . Vaping Use: Never used  Substance and Sexual Activity  . Alcohol use: No  . Drug use: Not Currently  . Sexual activity: Not Currently    Birth control/protection: None  Other Topics Concern  . Not on file  Social History Narrative   Lives by himself   Social Determinants of Health   Financial Resource Strain: Low Risk   . Difficulty of Paying Living Expenses: Not hard at all  Food Insecurity: No Food Insecurity  . Worried About [employer] of  Food in the Last Year: Never true  . Ran Out of Food in the Last Year: Never true  Transportation Needs: No Transportation Needs  . Lack of Transportation (Medical): No  . Lack of Transportation (Non-Medical): No  Physical Activity: Inactive  . Days of Exercise per Week: 0 days  . Minutes of Exercise per Session: 0 min  Stress: No Stress Concern Present  . Feeling of Stress : Not at all  Social Connections: Moderately Integrated  . Frequency of Communication with Friends and Family: More than three times a week  . Frequency of Social Gatherings with Friends and Family: More than three times a week  . Attends Religious Services: More than 4 times per year  . Active Member of Clubs or Organizations: Yes  . Attends Archivist Meetings: More than 4 times per year  . Marital Status: Widowed  Intimate Partner Violence: Not At Risk  . Fear of Current or Ex-Partner: No  . Emotionally Abused: No  . Physically Abused: No  . Sexually Abused: No        Objective:    BP 135/68   Pulse (!) 55   Temp (!) 96.7 F (35.9 C) (Temporal)   Ht 6\' 1"  (1.854 m)   Wt 164 lb (74.4 kg)   SpO2 94%   BMI 21.64 kg/m   Wt Readings from Last 3 Encounters:  06/11/20 164 lb (74.4 kg)   04/24/20 163 lb 6 oz (74.1 kg)  02/26/20 170 lb (77.1 kg)    Physical Exam Vitals reviewed.  Constitutional:      General: He is not in acute distress.    Appearance: Normal appearance. He is not ill-appearing, toxic-appearing or diaphoretic.  HENT:     Head: Normocephalic and atraumatic.  Eyes:     General: No scleral icterus.       Right eye: No discharge.        Left eye: No discharge.     Conjunctiva/sclera: Conjunctivae normal.  Cardiovascular:     Rate and Rhythm: Normal rate and regular rhythm.     Heart sounds: Normal heart sounds. No murmur heard. No friction rub. No gallop.   Pulmonary:     Effort: Pulmonary effort is normal. No respiratory distress.     Breath sounds: No stridor. Examination of the right-lower field reveals wheezing. Wheezing present. No rhonchi or rales.  Musculoskeletal:        General: Normal range of motion.     Cervical back: Normal range of motion.  Skin:    General: Skin is warm and dry.  Neurological:     Mental Status: He is alert and oriented to person, place, and time. Mental status is at baseline.     Motor: Weakness present.     Gait: Gait abnormal (ambulating with cane).  Psychiatric:        Mood and Affect: Mood normal.        Behavior: Behavior normal.        Thought Content: Thought content normal.        Judgment: Judgment normal.     No results found for: TSH Lab Results  Component Value Date   WBC 5.3 04/24/2020   HGB 13.3 04/24/2020   HCT 41.0 04/24/2020   MCV 88 04/24/2020   PLT 165 04/24/2020   Lab Results  Component Value Date   NA 139 04/24/2020   K 4.2 04/24/2020   CO2 28 04/24/2020   GLUCOSE 124 (H) 04/24/2020  BUN 19 04/24/2020   CREATININE 1.14 04/24/2020   BILITOT 0.4 04/24/2020   ALKPHOS 77 04/24/2020   AST 14 04/24/2020   ALT 9 04/24/2020   PROT 6.1 04/24/2020   ALBUMIN 3.9 04/24/2020   CALCIUM 9.5 04/24/2020   ANIONGAP 10 05/20/2019   Lab Results  Component Value Date   CHOL 118  12/10/2019   Lab Results  Component Value Date   HDL 38 (L) 12/10/2019   Lab Results  Component Value Date   LDLCALC 65 12/10/2019   Lab Results  Component Value Date   TRIG 76 12/10/2019   Lab Results  Component Value Date   CHOLHDL 3.1 12/10/2019   Lab Results  Component Value Date   HGBA1C 4.8 05/16/2019

## 2020-06-11 NOTE — Patient Instructions (Signed)
Chronic Obstructive Pulmonary Disease  Chronic obstructive pulmonary disease (COPD) is a long-term (chronic) lung problem. When you have COPD, it is hard for air to get in and out of your lungs. Usually the condition gets worse over time, and your lungs will never return to normal. There are things you can do to keep yourself as healthy as possible. What are the causes?  Smoking. This is the most common cause.  Certain genes passed from parent to child (inherited). What increases the risk?  Being exposed to secondhand smoke from cigarettes, pipes, or cigars.  Being exposed to chemicals and other irritants, such as fumes and dust in the work environment.  Having chronic lung conditions or infections. What are the signs or symptoms?  Shortness of breath, especially during physical activity.  A long-term cough with a large amount of thick mucus. Sometimes, the cough may not have any mucus (dry cough).  Wheezing.  Breathing quickly.  Skin that looks gray or blue, especially in the fingers, toes, or lips.  Feeling tired (fatigue).  Weight loss.  Chest tightness.  Having infections often.  Episodes when breathing symptoms become much worse (exacerbations). At the later stages of this disease, you may have swelling in the ankles, feet, or legs. How is this treated?  Taking medicines.  Quitting smoking, if you smoke.  Rehabilitation. This includes steps to make your body work better. It may involve a team of specialists.  Doing exercises.  Making changes to your diet.  Using oxygen.  Lung surgery.  Lung transplant.  Comfort measures (palliative care). Follow these instructions at home: Medicines  Take over-the-counter and prescription medicines only as told by your doctor.  Talk to your doctor before taking any cough or allergy medicines. You may need to avoid medicines that cause your lungs to be dry. Lifestyle  If you smoke, stop smoking. Smoking makes the  problem worse.  Do not smoke or use any products that contain nicotine or tobacco. If you need help quitting, ask your doctor.  Avoid being around things that make your breathing worse. This may include smoke, chemicals, and fumes.  Stay active, but remember to rest as well.  Learn and use tips on how to manage stress and control your breathing.  Make sure you get enough sleep. Most adults need at least 7 hours of sleep every night.  Eat healthy foods. Eat smaller meals more often. Rest before meals. Controlled breathing Learn and use tips on how to control your breathing as told by your doctor. Try:  Breathing in (inhaling) through your nose for 1 second. Then, pucker your lips and breath out (exhale) through your lips for 2 seconds.  Putting one hand on your belly (abdomen). Breathe in slowly through your nose for 1 second. Your hand on your belly should move out. Pucker your lips and breathe out slowly through your lips. Your hand on your belly should move in as you breathe out.   Controlled coughing Learn and use controlled coughing to clear mucus from your lungs. Follow these steps: 1. Lean your head a little forward. 2. Breathe in deeply. 3. Try to hold your breath for 3 seconds. 4. Keep your mouth slightly open while coughing 2 times. 5. Spit any mucus out into a tissue. 6. Rest and do the steps again 1 or 2 times as needed. General instructions  Make sure you get all the shots (vaccines) that your doctor recommends. Ask your doctor about a flu shot and a pneumonia shot.    Use oxygen therapy and pulmonary rehabilitation if told by your doctor. If you need home oxygen therapy, ask your doctor if you should buy a tool to measure your oxygen level (oximeter).  Make a COPD action plan with your doctor. This helps you to know what to do if you feel worse than usual.  Manage any other conditions you have as told by your doctor.  Avoid going outside when it is very hot, cold, or  humid.  Avoid people who have a sickness you can catch (contagious).  Keep all follow-up visits. Contact a doctor if:  You cough up more mucus than usual.  There is a change in the color or thickness of the mucus.  It is harder to breathe than usual.  Your breathing is faster than usual.  You have trouble sleeping.  You need to use your medicines more often than usual.  You have trouble doing your normal activities such as getting dressed or walking around the house. Get help right away if:  You have shortness of breath while resting.  You have shortness of breath that stops you from: ? Being able to talk. ? Doing normal activities.  Your chest hurts for longer than 5 minutes.  Your skin color is more blue than usual.  Your pulse oximeter shows that you have low oxygen for longer than 5 minutes.  You have a fever.  You feel too tired to breathe normally. These symptoms may represent a serious problem that is an emergency. Do not wait to see if the symptoms will go away. Get medical help right away. Call your local emergency services (911 in the [place] Do not drive yourself to the hospital. Summary  Chronic obstructive pulmonary disease (COPD) is a long-term lung problem.  The way your lungs work will never return to normal. Usually the condition gets worse over time. There are things you can do to keep yourself as healthy as possible.  Take over-the-counter and prescription medicines only as told by your doctor.  If you smoke, stop. Smoking makes the problem worse. This information is not intended to replace advice given to you by your health care provider. Make sure you discuss any questions you have with your health care provider. Document Revised: 03/17/2020 Document Reviewed: 03/17/2020 Elsevier Patient Education  2021 Elsevier Inc.   

## 2020-06-16 ENCOUNTER — Other Ambulatory Visit: Payer: Self-pay | Admitting: Family Medicine

## 2020-06-16 DIAGNOSIS — I509 Heart failure, unspecified: Secondary | ICD-10-CM

## 2020-07-17 ENCOUNTER — Other Ambulatory Visit: Payer: Self-pay

## 2020-07-17 ENCOUNTER — Ambulatory Visit (INDEPENDENT_AMBULATORY_CARE_PROVIDER_SITE_OTHER): Payer: Medicare Other

## 2020-07-17 DIAGNOSIS — I110 Hypertensive heart disease with heart failure: Secondary | ICD-10-CM | POA: Diagnosis not present

## 2020-07-17 DIAGNOSIS — Z87891 Personal history of nicotine dependence: Secondary | ICD-10-CM

## 2020-07-17 DIAGNOSIS — Z85118 Personal history of other malignant neoplasm of bronchus and lung: Secondary | ICD-10-CM

## 2020-07-17 DIAGNOSIS — K219 Gastro-esophageal reflux disease without esophagitis: Secondary | ICD-10-CM

## 2020-07-17 DIAGNOSIS — Z9181 History of falling: Secondary | ICD-10-CM

## 2020-07-17 DIAGNOSIS — J449 Chronic obstructive pulmonary disease, unspecified: Secondary | ICD-10-CM

## 2020-07-17 DIAGNOSIS — I509 Heart failure, unspecified: Secondary | ICD-10-CM

## 2020-07-17 DIAGNOSIS — I252 Old myocardial infarction: Secondary | ICD-10-CM

## 2020-08-16 IMAGING — DX DG FEMUR 2+V*R*
4 series · 4 of 4 positions shown · non-contrast
Comparison: 04/15/2019, 03/12/2019.

CLINICAL DATA: 83-year-old with generalized weakness and fever
which began acutely this morning. Patient has wounds involving the
inner RIGHT thigh and the RIGHT lower leg related to a tractor
accident 2 months ago. Diffuse edema and erythema involving the
RIGHT leg.

EXAM:
RIGHT FEMUR 2 VIEWS

[femur ap (1 of 2)]
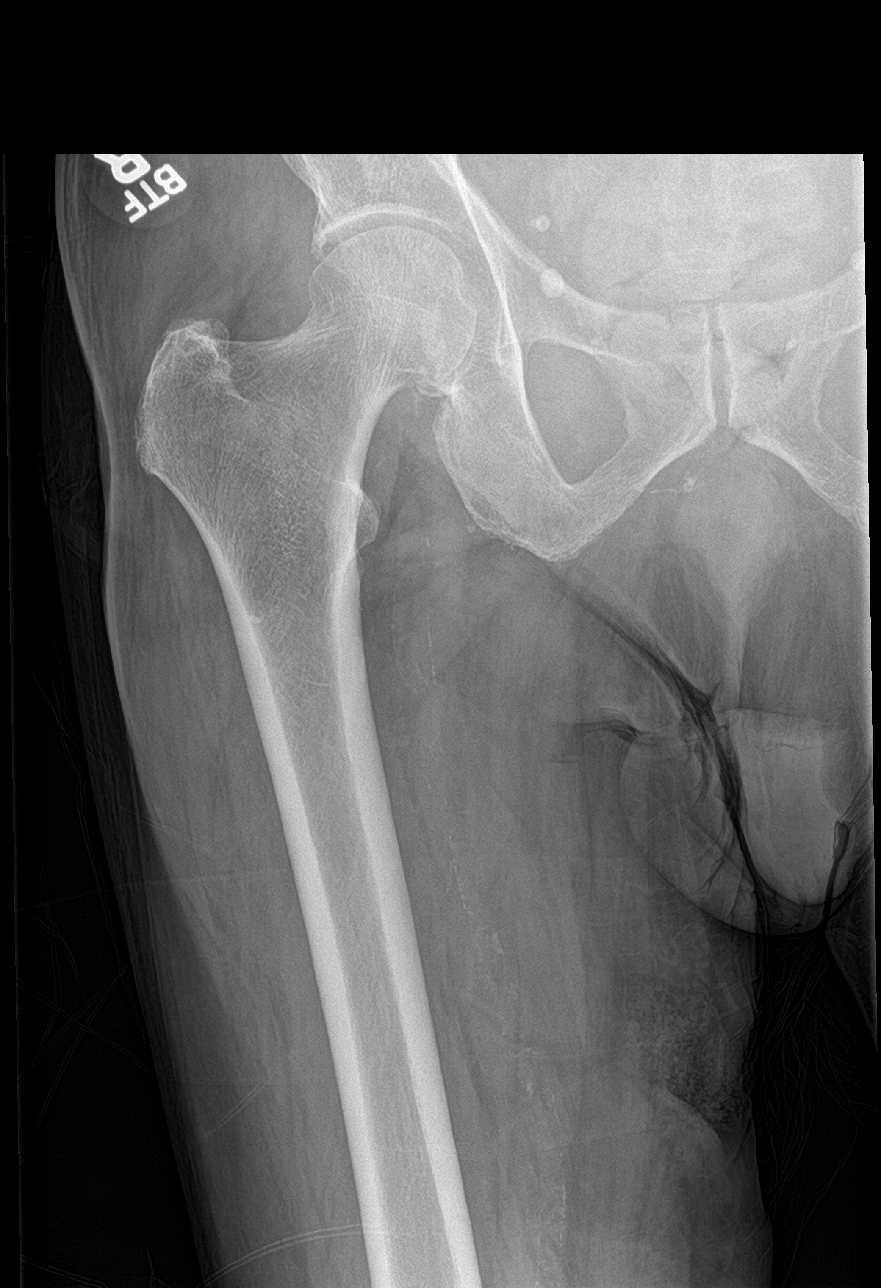

[femur ap (2 of 2)]
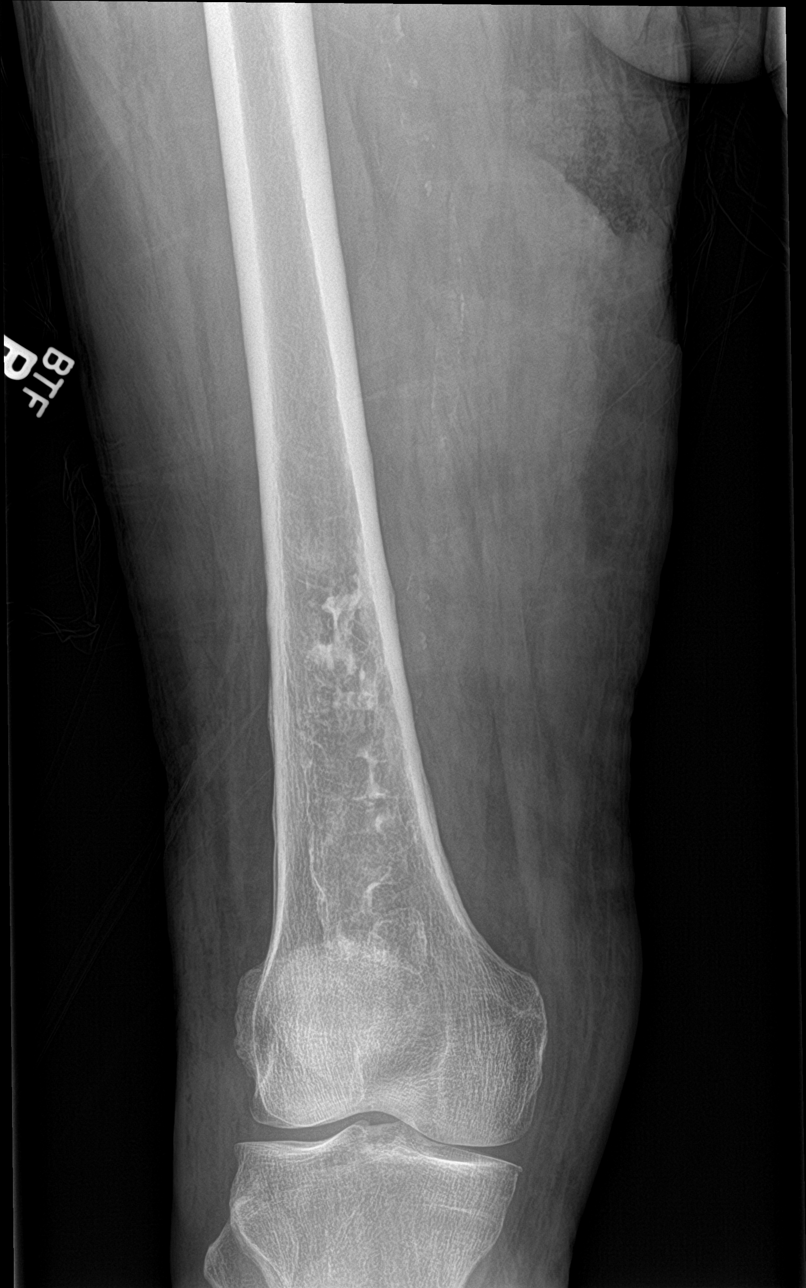

[femur lat (1 of 2)]
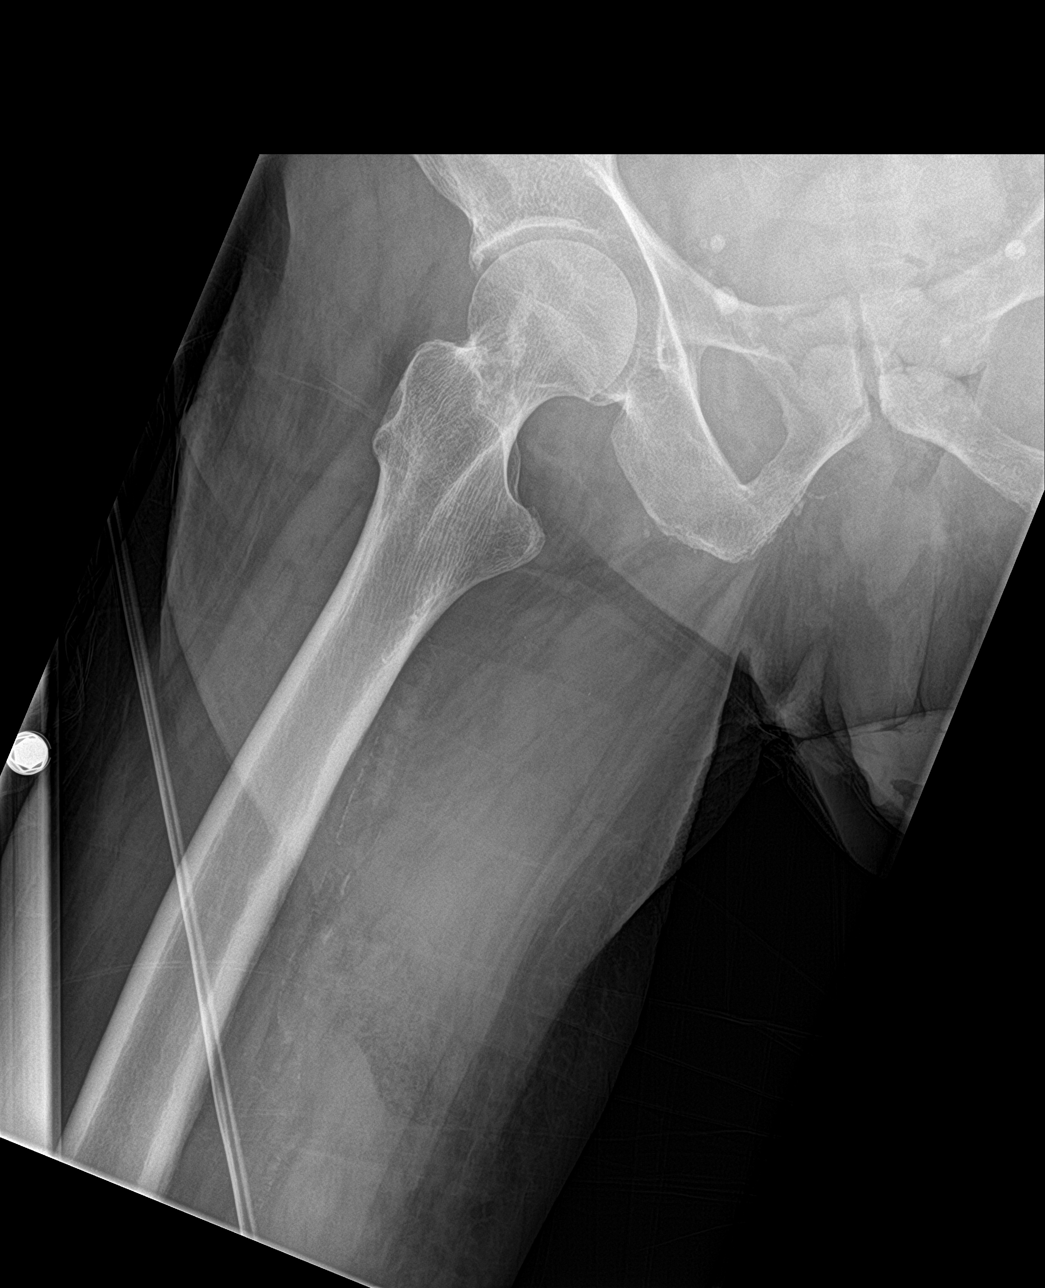

[femur lat (2 of 2)]
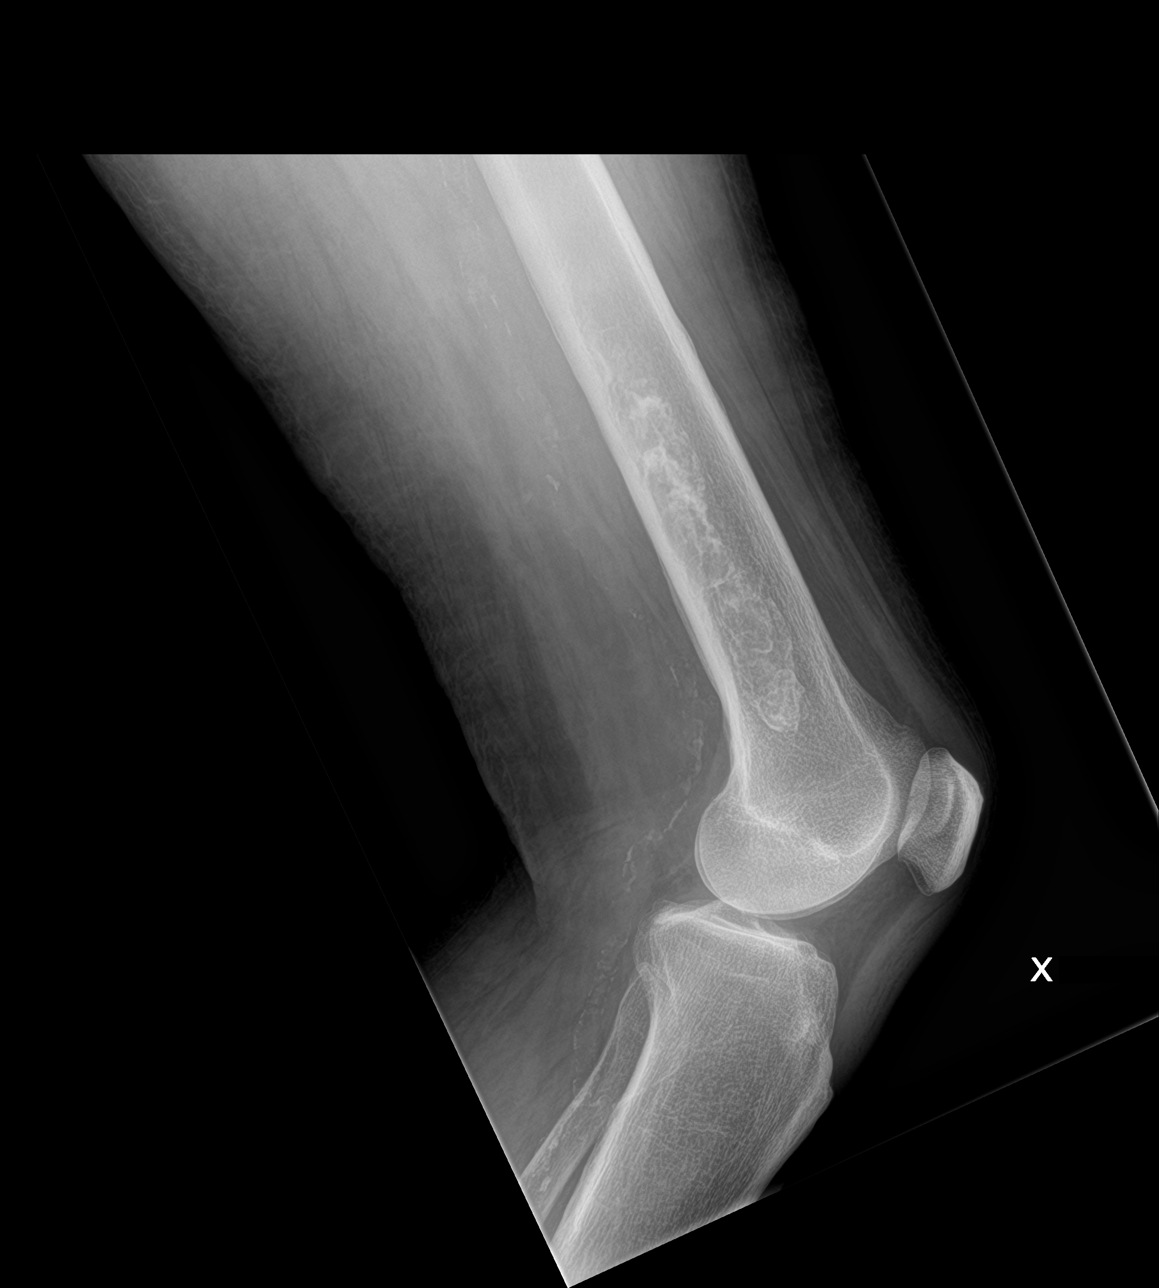

[4 of 4 positions shown; findings below may reference images not displayed]

FINDINGS: No evidence of acute or subacute fracture. Well-preserved bone
mineral density. Mixed lucent and sclerotic lesion involving the
distal metadiaphysis, unchanged when compared to the prior
examinations. Hip joint anatomically aligned with a well-preserved
joint space.

Femoropopliteal atherosclerosis. Gas bubbles involving the soft
tissues of the proximal inner thigh, likely related to infection
and/or overlying bandage material.
IMPRESSION: 1. No acute or subacute osseous abnormality.
2. Stable mixed lucent and sclerotic lesion involving the distal
metadiaphysis of the right femur, likely infarction or low-grade
cartilaginous lesion such as enchondroma.
3. Gas bubbles involving the soft tissues of the proximal inner
thigh, likely related to infection and/or overlying bandage
material.

## 2020-08-16 IMAGING — DX DG TIBIA/FIBULA 2V*R*
4 series · 4 of 4 positions shown · non-contrast
Comparison: 03/12/2019.

CLINICAL DATA: 83-year-old with generalized weakness and fever
which began acutely this morning. Patient has wounds involving the
inner RIGHT thigh and the RIGHT lower leg related to a tractor
accident 2 months ago. Diffuse edema and erythema involving the
RIGHT leg.

EXAM:
RIGHT TIBIA AND FIBULA - 2 VIEW

[tibia ap (1 of 2)]
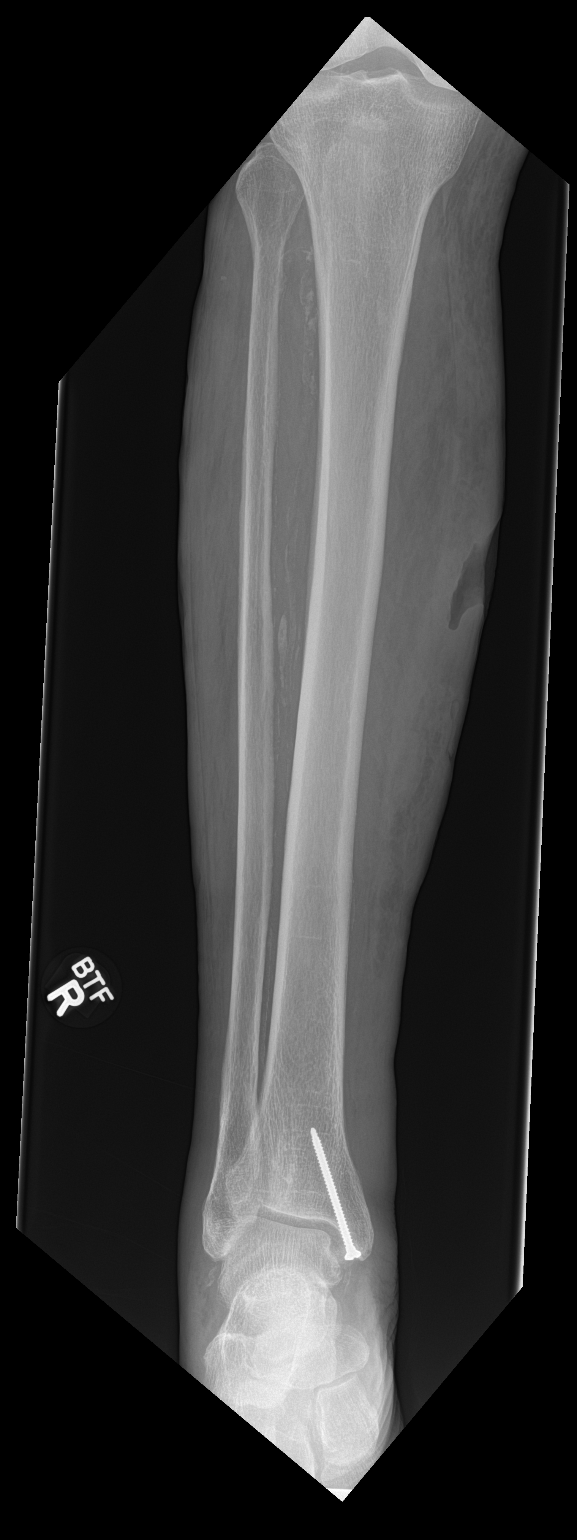

[tibia ap (2 of 2)]
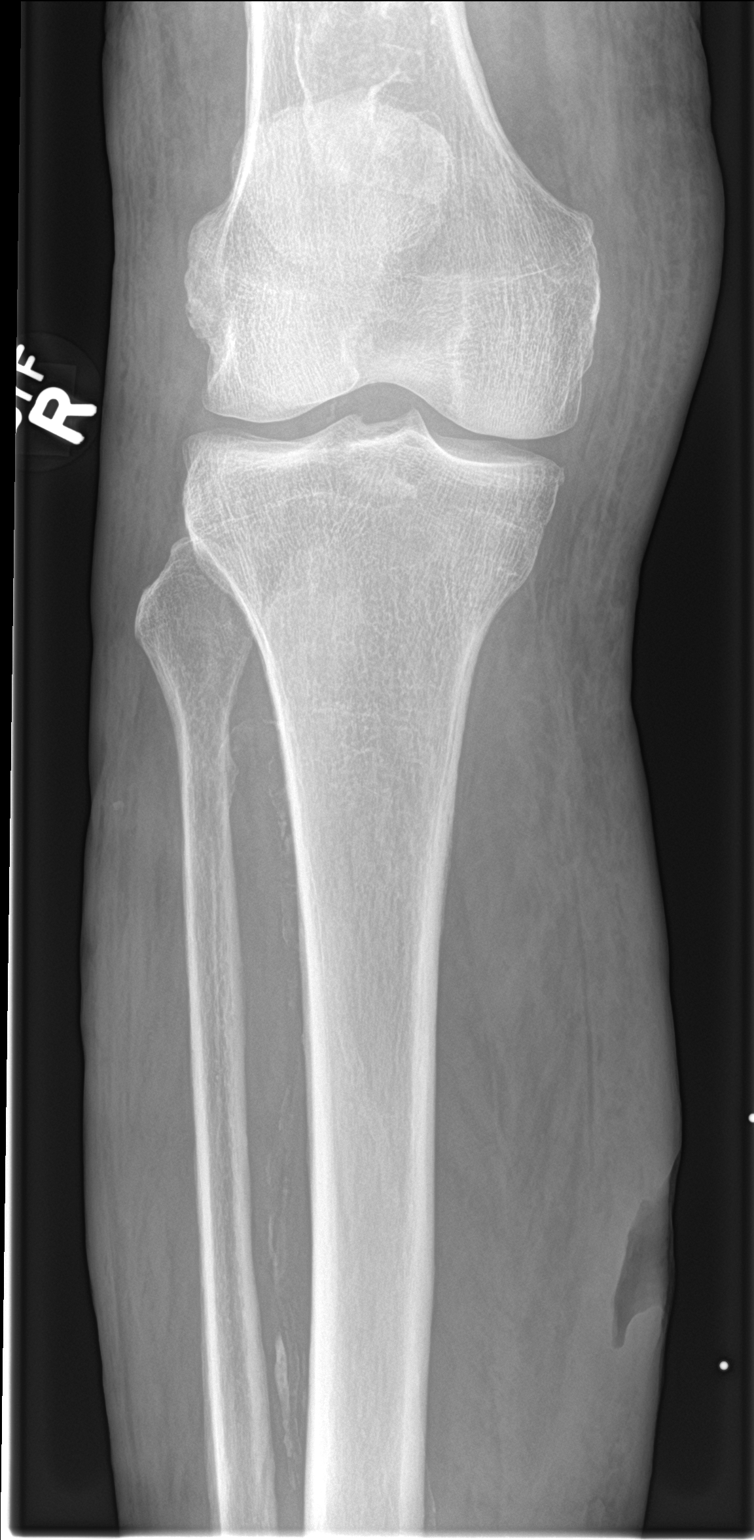

[tibia lat (1 of 2)]
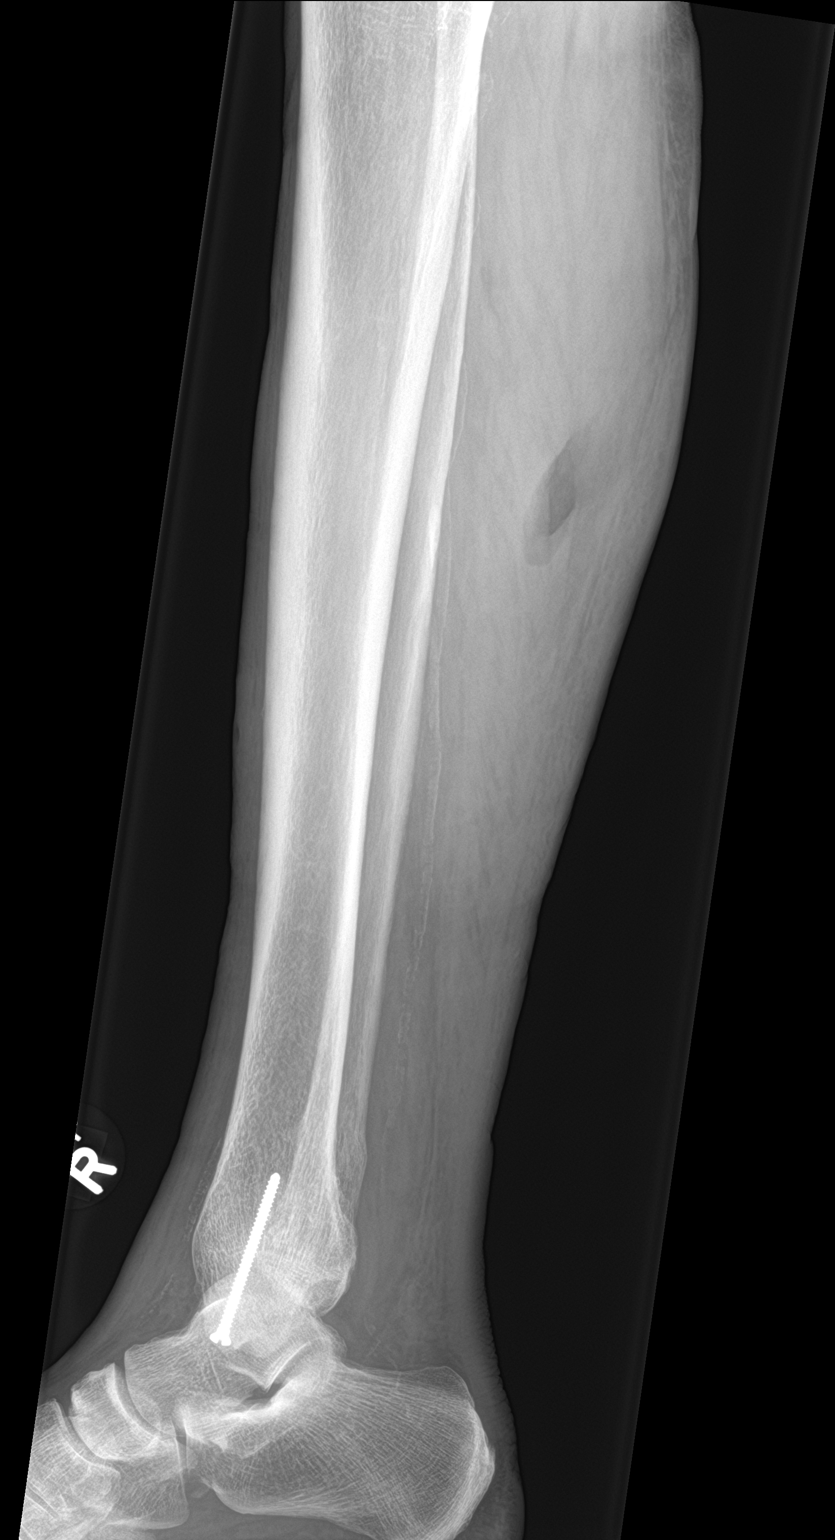

[tibia lat (2 of 2)]
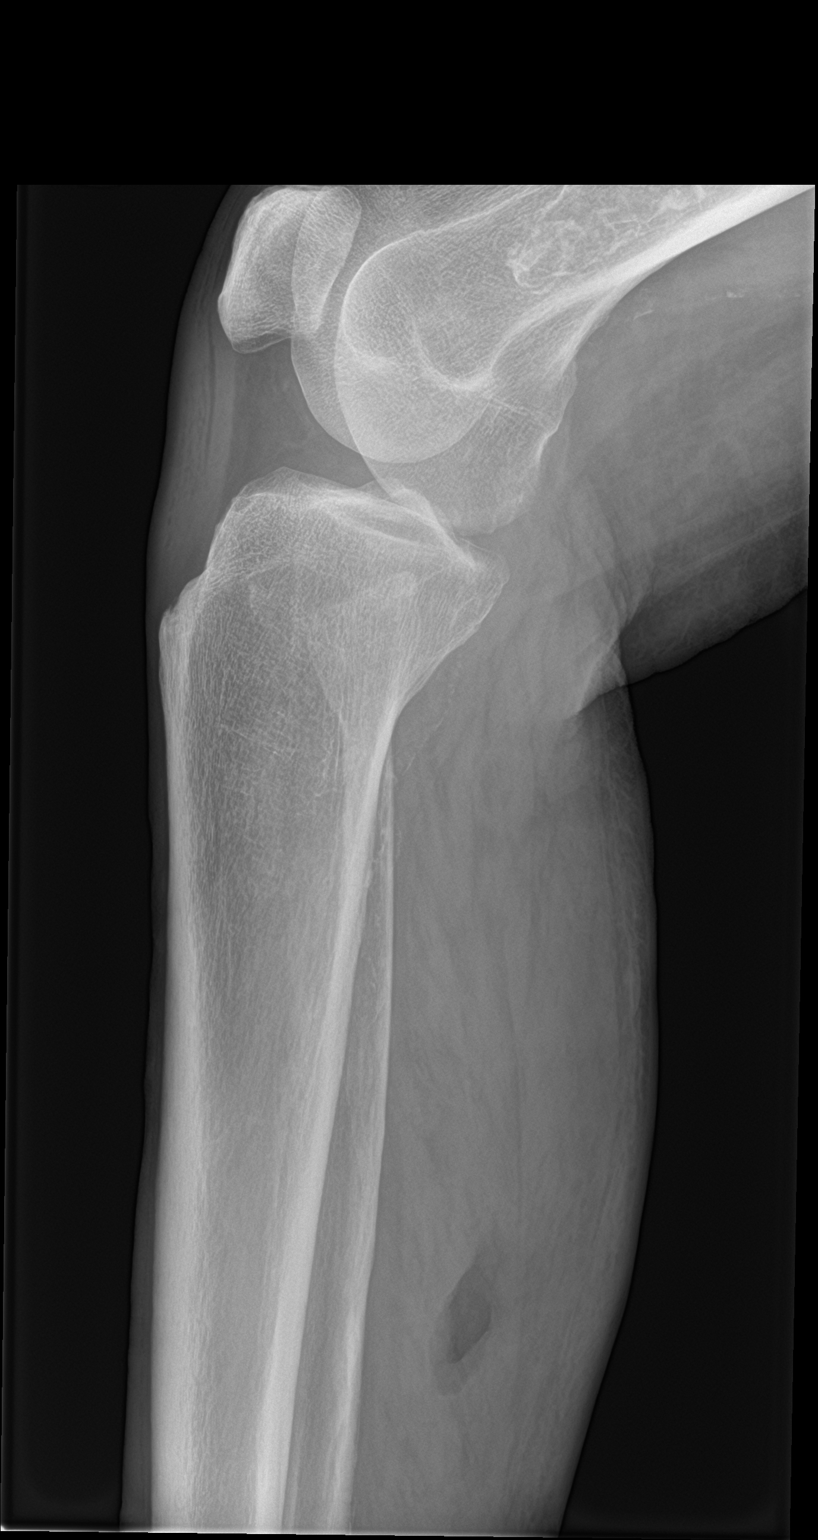

[4 of 4 positions shown; findings below may reference images not displayed]

FINDINGS: No evidence of acute or subacute fracture involving the tibia or
fibula. No evidence of osteomyelitis. Prior ORIF of a MEDIAL
malleolar fracture with normal healing. Well-preserved bone mineral
density. Knee joint anatomically aligned with well-preserved joint
spaces. Interval development of a gas-filled ulceration involving
the soft tissues of the posteromedial mid calf.
IMPRESSION: 1. No acute or subacute osseous abnormality.
2. Interval development of a gas-filled ulceration involving the
soft tissues of the posteromedial mid calf.

## 2020-12-09 ENCOUNTER — Encounter: Payer: Self-pay | Admitting: Family Medicine

## 2020-12-09 ENCOUNTER — Ambulatory Visit (INDEPENDENT_AMBULATORY_CARE_PROVIDER_SITE_OTHER): Payer: Medicare Other | Admitting: Family Medicine

## 2020-12-09 ENCOUNTER — Other Ambulatory Visit: Payer: Self-pay

## 2020-12-09 VITALS — BP 133/64 | HR 56 | Temp 98.0°F

## 2020-12-09 DIAGNOSIS — I509 Heart failure, unspecified: Secondary | ICD-10-CM

## 2020-12-09 DIAGNOSIS — I2510 Atherosclerotic heart disease of native coronary artery without angina pectoris: Secondary | ICD-10-CM

## 2020-12-09 DIAGNOSIS — K219 Gastro-esophageal reflux disease without esophagitis: Secondary | ICD-10-CM

## 2020-12-09 DIAGNOSIS — J449 Chronic obstructive pulmonary disease, unspecified: Secondary | ICD-10-CM

## 2020-12-09 DIAGNOSIS — I10 Essential (primary) hypertension: Secondary | ICD-10-CM

## 2020-12-09 DIAGNOSIS — R531 Weakness: Secondary | ICD-10-CM

## 2020-12-09 DIAGNOSIS — I252 Old myocardial infarction: Secondary | ICD-10-CM | POA: Diagnosis not present

## 2020-12-09 MED ORDER — PANTOPRAZOLE SODIUM 40 MG PO TBEC
40.0000 mg | DELAYED_RELEASE_TABLET | Freq: Every day | ORAL | 1 refills | Status: DC
Start: 2020-12-09 — End: 2021-07-15

## 2020-12-09 MED ORDER — ALBUTEROL SULFATE HFA 108 (90 BASE) MCG/ACT IN AERS
2.0000 | INHALATION_SPRAY | Freq: Four times a day (QID) | RESPIRATORY_TRACT | 2 refills | Status: DC | PRN
Start: 2020-12-09 — End: 2021-07-15

## 2020-12-09 MED ORDER — ATORVASTATIN CALCIUM 80 MG PO TABS
80.0000 mg | ORAL_TABLET | Freq: Every evening | ORAL | 1 refills | Status: DC
Start: 2020-12-09 — End: 2021-07-15

## 2020-12-09 MED ORDER — FUROSEMIDE 20 MG PO TABS
20.0000 mg | ORAL_TABLET | Freq: Every morning | ORAL | 1 refills | Status: DC
Start: 2020-12-09 — End: 2021-04-14

## 2020-12-09 NOTE — Progress Notes (Signed)
Assessment & Plan:  1. Essential hypertension Well controlled on current regimen.  - CBC with Differential/Platelet - CMP14+EGFR - Lipid panel  2. Coronary artery disease involving native coronary artery of native heart without angina pectoris On a statin. - atorvastatin (LIPITOR) 80 MG tablet; Take 1 tablet (80 mg total) by mouth every evening.  Dispense: 90 tablet; Refill: 1 - CMP14+EGFR - Lipid panel  3. History of heart attack On a statin. - atorvastatin (LIPITOR) 80 MG tablet; Take 1 tablet (80 mg total) by mouth every evening.  Dispense: 90 tablet; Refill: 1 - Lipid panel  4. Chronic congestive heart failure, unspecified heart failure type ([place]) Well controlled on current regimen.  - furosemide (LASIX) 20 MG tablet; Take 1 tablet (20 mg total) by mouth every morning.  Dispense: 90 tablet; Refill: 1 - CBC with Differential/Platelet - CMP14+EGFR - Lipid panel  5. Chronic obstructive pulmonary disease, unspecified COPD type ([place]) Well controlled on current regimen. Education provided on COPD and physical activity. - albuterol (VENTOLIN HFA) 108 (90 Base) MCG/ACT inhaler; Inhale 2 puffs into the lungs every 6 (six) hours as needed for wheezing or shortness of breath.  Dispense: 18 g; Refill: 2  6. Gastroesophageal reflux disease, unspecified whether esophagitis present Well controlled on current regimen.  - pantoprazole (PROTONIX) 40 MG tablet; Take 1 tablet (40 mg total) by mouth daily.  Dispense: 90 tablet; Refill: 1 - CMP14+EGFR  7. Generalized weakness Encouraged to use a walker for his safety. Continue daily physical therapy exercises.    Return in about 6 months (around 06/11/2021) for follow-up of chronic medication conditions (treat as CPE).  Hendricks Limes, MSN, APRN, FNP-C [facility]  Subjective:    Patient ID: Paul Snyder, male    DOB: Mar 01, 1936, 85 y.o.   MRN: [id number]  Patient Care Team: Loman Brooklyn, FNP as PCP - General  (Family Medicine) [name], Alphonse Guild, MD as PCP - Cardiology (Cardiology) [name] Alphonse Guild, MD as Consulting Physician (Cardiology)   Chief Complaint:  Chief Complaint  Patient presents with   Hypertension   COPD    6 month follow up of chronic medical conditions     HPI: Paul Snyder is a 85 y.o. male presenting on 12/09/2020 for Hypertension and COPD (6 month follow up of chronic medical conditions )  Hypertension: Patient tolerating medications well.  Denies any chest pain or dizziness.  He does not check his blood pressure at home but does have a machine if he needs to.  CHF: Patient denies any shortness of breath or chest pain.  He does not weigh himself at home, but does have a scale if he needs to.  COPD: Patient reports he does have an albuterol inhaler at home but hardly ever needs to use it.  He denies any shortness of breath or tightness in the chest.  GERD: Controlled with Protonix.  Generalized weakness: patient previously completed physical therapy through home health. He has continued doing these exercises daily at home himself. He does use a cane and has to hold on to someone or a wall if he does not have it. He is hesitant to use his walker.   New complaints: None   Social history:  Relevant past medical, surgical, family and social history reviewed and updated as indicated. Interim medical history since our last visit reviewed.  Allergies and medications reviewed and updated.  DATA REVIEWED: CHART IN EPIC  ROS: Negative unless specifically indicated above in HPI.  Current Outpatient Medications:    acetaminophen (TYLENOL) 325 MG tablet, Take 2 tablets (650 mg total) by mouth every 6 (six) hours as needed for mild pain (or Fever >/= 101)., Disp: 12 tablet, Rfl: 0   albuterol (VENTOLIN HFA) 108 (90 Base) MCG/ACT inhaler, Inhale 2 puffs into the lungs every 6 (six) hours as needed., Disp: , Rfl:    aspirin 81 MG EC tablet, Take 1 tablet (81 mg total) by  mouth daily with breakfast., Disp: 30 tablet, Rfl: 12   atorvastatin (LIPITOR) 80 MG tablet, Take 1 tablet (80 mg total) by mouth every evening., Disp: 90 tablet, Rfl: 1   furosemide (LASIX) 20 MG tablet, TAKE 1 TABLET BY MOUTH ONCE DAILY IN THE MORNING, Disp: 90 tablet, Rfl: 1   niacin 500 MG tablet, Take 500 mg by mouth every morning. , Disp: , Rfl:    pantoprazole (PROTONIX) 40 MG tablet, Take 1 tablet (40 mg total) by mouth daily., Disp: 90 tablet, Rfl: 1   No Known Allergies Past Medical History:  Diagnosis Date   CHF (congestive heart failure) (HCC)    COPD (chronic obstructive pulmonary disease) (HCC)    GERD (gastroesophageal reflux disease)    Lung cancer ([place]) 2009   NSTEMI (non-ST elevated myocardial infarction) ([place]) 07/16/2017   2/19 PCI/DES x1 to Lcx with moderate (50%) in the RCA, mild (20%) in LAD    Past Surgical History:  Procedure Laterality Date   ANKLE FRACTURE SURGERY Right 1970s   CATARACT EXTRACTION Left    CORONARY ANGIOPLASTY WITH STENT PLACEMENT  07/19/2017   CORONARY STENT INTERVENTION N/A 07/19/2017   Procedure: CORONARY STENT INTERVENTION;  Surgeon: [name], MD;  Location: [place] [facility];  Service: Cardiovascular;  Laterality: N/A;   LEFT HEART CATH AND CORONARY ANGIOGRAPHY N/A 07/19/2017   Procedure: LEFT HEART CATH AND CORONARY ANGIOGRAPHY;  Surgeon: [name], MD;  Location: [facility] [facility];  Service: Cardiovascular;  Laterality: N/A;   LUNG BIOPSY      Social History   Socioeconomic History   Marital status: Widowed    Spouse name: Not on file   Number of children: 2   Years of education: 7   Highest education level: 7th grade  Occupational History   Occupation: retired  Tobacco Use   Smoking status: Former    Packs/day: 1.00    Years: 42.00    Pack years: 42.00    Types: Cigarettes    Quit date: 1996    Years since quitting: 26.5   Smokeless tobacco: Never  Vaping Use   Vaping Use: Never used  Substance and Sexual  Activity   Alcohol use: No   Drug use: Not Currently   Sexual activity: Not Currently    Birth control/protection: None  Other Topics Concern   Not on file  Social History Narrative   Lives by himself   Social Determinants of Health   Financial Resource Strain: Not on file  Food Insecurity: Not on file  Transportation Needs: Not on file  Physical Activity: Not on file  Stress: Not on file  Social Connections: Not on file  Intimate Partner Violence: Not on file        Objective:    BP 133/64   Pulse (!) 56   Temp 98 F (36.7 C) (Temporal)   SpO2 93%   Wt Readings from Last 3 Encounters:  06/11/20 164 lb (74.4 kg)  04/24/20 163 lb 6 oz (74.1 kg)  02/26/20 170 lb (  77.1 kg)    Physical Exam Vitals reviewed.  Constitutional:      General: He is not in acute distress.    Appearance: Normal appearance. He is not ill-appearing, toxic-appearing or diaphoretic.  HENT:     Head: Normocephalic and atraumatic.  Eyes:     General: No scleral icterus.       Right eye: No discharge.        Left eye: No discharge.     Conjunctiva/sclera: Conjunctivae normal.  Cardiovascular:     Rate and Rhythm: Normal rate and regular rhythm.     Heart sounds: Normal heart sounds. No murmur heard.   No friction rub. No gallop.  Pulmonary:     Effort: Pulmonary effort is normal. No respiratory distress.     Breath sounds: No stridor. Rhonchi present. No wheezing or rales.  Musculoskeletal:        General: Normal range of motion.     Cervical back: Normal range of motion.  Skin:    General: Skin is warm and dry.  Neurological:     Mental Status: He is alert and oriented to person, place, and time. Mental status is at baseline.     Gait: Gait abnormal (ambulating with cane).  Psychiatric:        Mood and Affect: Mood normal.        Behavior: Behavior normal.        Thought Content: Thought content normal.        Judgment: Judgment normal.    No results found for: TSH Lab Results   Component Value Date   WBC 5.3 04/24/2020   HGB 13.3 04/24/2020   HCT 41.0 04/24/2020   MCV 88 04/24/2020   PLT 165 04/24/2020   Lab Results  Component Value Date   NA 139 04/24/2020   K 4.2 04/24/2020   CO2 28 04/24/2020   GLUCOSE 124 (H) 04/24/2020   BUN 19 04/24/2020   CREATININE 1.14 04/24/2020   BILITOT 0.4 04/24/2020   ALKPHOS 77 04/24/2020   AST 14 04/24/2020   ALT 9 04/24/2020   PROT 6.1 04/24/2020   ALBUMIN 3.9 04/24/2020   CALCIUM 9.5 04/24/2020   ANIONGAP 10 05/20/2019   Lab Results  Component Value Date   CHOL 118 12/10/2019   Lab Results  Component Value Date   HDL 38 (L) 12/10/2019   Lab Results  Component Value Date   LDLCALC 65 12/10/2019   Lab Results  Component Value Date   TRIG 76 12/10/2019   Lab Results  Component Value Date   CHOLHDL 3.1 12/10/2019   Lab Results  Component Value Date   HGBA1C 4.8 05/16/2019

## 2020-12-10 LAB — CMP14+EGFR
ALT: 7 IU/L (ref 0–44)
AST: 12 IU/L (ref 0–40)
Albumin/Globulin Ratio: 1.7 (ref 1.2–2.2)
Albumin: 3.8 g/dL (ref 3.6–4.6)
Alkaline Phosphatase: 66 IU/L (ref 44–121)
BUN/Creatinine Ratio: 11 (ref 10–24)
BUN: 11 mg/dL (ref 8–27)
Bilirubin Total: 0.5 mg/dL (ref 0.0–1.2)
CO2: 27 mmol/L (ref 20–29)
Calcium: 9.6 mg/dL (ref 8.6–10.2)
Chloride: 101 mmol/L (ref 96–106)
Creatinine, Ser: 0.98 mg/dL (ref 0.76–1.27)
Globulin, Total: 2.2 g/dL (ref 1.5–4.5)
Glucose: 91 mg/dL (ref 65–99)
Potassium: 4.4 mmol/L (ref 3.5–5.2)
Sodium: 141 mmol/L (ref 134–144)
Total Protein: 6 g/dL (ref 6.0–8.5)
eGFR: 77 mL/min/{1.73_m2} (ref >59)

## 2020-12-10 LAB — CBC WITH DIFFERENTIAL/PLATELET
Basophils Absolute: 0 10*3/uL (ref 0.0–0.2)
Basos: 1 %
EOS (ABSOLUTE): 0.2 10*3/uL (ref 0.0–0.4)
Eos: 5 %
Hematocrit: 39.1 % (ref 37.5–51.0)
Hemoglobin: 12.8 g/dL — ABNORMAL LOW (ref 13.0–17.7)
Immature Grans (Abs): 0 10*3/uL (ref 0.0–0.1)
Immature Granulocytes: 0 %
Lymphocytes Absolute: 0.9 10*3/uL (ref 0.7–3.1)
Lymphs: 20 %
MCH: 28.3 pg (ref 26.6–33.0)
MCHC: 32.7 g/dL (ref 31.5–35.7)
MCV: 86 fL (ref 79–97)
Monocytes Absolute: 0.5 10*3/uL (ref 0.1–0.9)
Monocytes: 12 %
Neutrophils Absolute: 2.8 10*3/uL (ref 1.4–7.0)
Neutrophils: 62 %
Platelets: 144 10*3/uL — ABNORMAL LOW (ref 150–450)
RBC: 4.53 x10E6/uL (ref 4.14–5.80)
RDW: 13.4 % (ref 11.6–15.4)
WBC: 4.4 10*3/uL (ref 3.4–10.8)

## 2020-12-10 LAB — LIPID PANEL
Chol/HDL Ratio: 2.3 ratio (ref 0.0–5.0)
Cholesterol, Total: 105 mg/dL (ref 100–199)
HDL: 46 mg/dL (ref >39)
LDL Chol Calc (NIH): 48 mg/dL (ref 0–99)
Triglycerides: 40 mg/dL (ref 0–149)
VLDL Cholesterol Cal: 11 mg/dL (ref 5–40)

## 2020-12-11 ENCOUNTER — Ambulatory Visit (INDEPENDENT_AMBULATORY_CARE_PROVIDER_SITE_OTHER): Payer: Medicare Other

## 2020-12-11 VITALS — Ht 73.0 in | Wt 170.0 lb

## 2020-12-11 DIAGNOSIS — Z0000 Encounter for general adult medical examination without abnormal findings: Secondary | ICD-10-CM

## 2020-12-11 NOTE — Progress Notes (Signed)
Subjective:   Paul Snyder is a 85 y.o. male who presents for Medicare Annual/Subsequent preventive examination.  Virtual Visit via Telephone Note  I connected with  Paul Snyder on 12/11/20 at  2:00 PM EDT by telephone and verified that I am speaking with the correct person using two identifiers.  Location: Patient: Home Provider: [facility] Persons participating in the virtual visit: patient/Nurse Health Advisor   I discussed the limitations, risks, security and privacy concerns of performing an evaluation and management service by telephone and the availability of in person appointments. The patient expressed understanding and agreed to proceed.  Interactive audio and video telecommunications were attempted between this nurse and patient, however failed, due to patient having technical difficulties OR patient did not have access to video capability.  We continued and completed visit with audio only.  Some vital signs may be absent or patient reported.   Paul Beed E Roxanne Orner, LPN   Review of Systems     Cardiac Risk Factors include: advanced age (>79men, >31 women);male gender;sedentary lifestyle;dyslipidemia;Other (see comment), Risk factor comments: hx of MI, atherosclerosis     Objective:    Today's Vitals   12/11/20 1139  Weight: 170 lb (77.1 kg)  Height: 6\' 1"  (1.854 m)   Body mass index is 22.43 kg/m.  Advanced Directives 12/11/2020 07/10/2019 07/08/2019 07/05/2019 05/16/2019 05/16/2019 05/16/2019  Does Patient Have a Medical Advance Directive? Yes Yes Yes No - Yes Yes  Type of Paramedic of [place];Living will [place];Living will - - - [place];Living will [place];Living will  Does patient want to make changes to medical advance directive? - No - Patient declined - - No - Patient declined - -  Copy of [place] in Chart? No - copy requested No - copy requested - - - No -  copy requested -  Would patient like information on creating a medical advance directive? - - - - - - -    Current Medications (verified) Outpatient Encounter Medications as of 12/11/2020  Medication Sig   acetaminophen (TYLENOL) 325 MG tablet Take 2 tablets (650 mg total) by mouth every 6 (six) hours as needed for mild pain (or Fever >/= 101).   albuterol (VENTOLIN HFA) 108 (90 Base) MCG/ACT inhaler Inhale 2 puffs into the lungs every 6 (six) hours as needed for wheezing or shortness of breath.   aspirin 81 MG EC tablet Take 1 tablet (81 mg total) by mouth daily with breakfast.   atorvastatin (LIPITOR) 80 MG tablet Take 1 tablet (80 mg total) by mouth every evening.   furosemide (LASIX) 20 MG tablet Take 1 tablet (20 mg total) by mouth every morning.   niacin 500 MG tablet Take 500 mg by mouth every morning.    pantoprazole (PROTONIX) 40 MG tablet Take 1 tablet (40 mg total) by mouth daily.   No facility-administered encounter medications on file as of 12/11/2020.    Allergies (verified) Patient has no known allergies.   History: Past Medical History:  Diagnosis Date   CHF (congestive heart failure) (HCC)    COPD (chronic obstructive pulmonary disease) (HCC)    GERD (gastroesophageal reflux disease)    Lung cancer ([place]) 2009   NSTEMI (non-ST elevated myocardial infarction) ([place]) 07/16/2017   2/19 PCI/DES x1 to Lcx with moderate (50%) in the RCA, mild (20%) in LAD   Past Surgical History:  Procedure Laterality Date   ANKLE FRACTURE SURGERY Right 1970s  CATARACT EXTRACTION Left    CORONARY ANGIOPLASTY WITH STENT PLACEMENT  07/19/2017   CORONARY STENT INTERVENTION N/A 07/19/2017   Procedure: CORONARY STENT INTERVENTION;  Surgeon: [name], MD;  Location: [place];  Service: Cardiovascular;  Laterality: N/A;   LEFT HEART CATH AND CORONARY ANGIOGRAPHY N/A 07/19/2017   Procedure: LEFT HEART CATH AND CORONARY ANGIOGRAPHY;  Surgeon: [name], MD;  Location: [facility];  Service: Cardiovascular;  Laterality: N/A;   LUNG BIOPSY     Family History  Problem Relation Age of Onset   Healthy Mother    Breast cancer Sister    Heart disease Brother    Heart failure Brother    Breast cancer Sister    Hypertension Son    Hyperlipidemia Son    Social History   Socioeconomic History   Marital status: Widowed    Spouse name: Not on file   Number of children: 2   Years of education: 7   Highest education level: 7th grade  Occupational History   Occupation: retired  Tobacco Use   Smoking status: Former    Packs/day: 1.00    Years: 42.00    Pack years: 42.00    Types: Cigarettes    Quit date: 1996    Years since quitting: 26.5   Smokeless tobacco: Never  Vaping Use   Vaping Use: Never used  Substance and Sexual Activity   Alcohol use: No   Drug use: Not Currently   Sexual activity: Not Currently    Birth control/protection: None  Other Topics Concern   Not on file  Social History Narrative   Lives by himself, son lives next door, daughter 15 minutes away   Social Determinants of Health   Financial Resource Strain: Low Risk    Difficulty of Paying Living Expenses: Not hard at all  Food Insecurity: No Food Insecurity   Worried About Charity fundraiser in the Last Year: Never true   Arboriculturist in the Last Year: Never true  Transportation Needs: No Transportation Needs   Lack of Transportation (Medical): No   Lack of Transportation (Non-Medical): No  Physical Activity: Insufficiently Active   Days of Exercise per Week: 7 days   Minutes of Exercise per Session: 10 min  Stress: No Stress Concern Present   Feeling of Stress : Not at all  Social Connections: Moderately Integrated   Frequency of Communication with Friends and Family: More than three times a week   Frequency of Social Gatherings with Friends and Family: More than three times a week   Attends Religious Services: More than 4 times per year   Active Member of  [employer] or Organizations: Yes   Attends Archivist Meetings: More than 4 times per year   Marital Status: Widowed    Tobacco Counseling Counseling given: Not Answered   Clinical Intake:  Pre-visit preparation completed: Yes  Pain : No/denies pain     BMI - recorded: 22.43 Nutritional Status: BMI of 19-24  Normal Nutritional Risks: None Diabetes: No  How often do you need to have someone help you when you read instructions, pamphlets, or other written materials from your doctor or pharmacy?: 1 - Never  Diabetic? No  Interpreter Needed?: No  Information entered by :: Wafa Martes, LPN   Activities of Daily Living In your present state of health, do you have any difficulty performing the following activities: 12/11/2020  Hearing? Y  Comment a little  Vision? N  Difficulty concentrating or making decisions? Y  Walking or climbing stairs? Y  Dressing or bathing? N  Doing errands, shopping? N  Preparing Food and eating ? N  Using the Toilet? N  In the past six months, have you accidently leaked urine? N  Do you have problems with loss of bowel control? N  Managing your Medications? N  Managing your Finances? N  Housekeeping or managing your Housekeeping? N  Some recent data might be hidden    Patient Care Team: Loman Brooklyn, FNP as PCP - General (Family Medicine) [name] Alphonse Guild, MD as PCP - Cardiology (Cardiology) [name] Alphonse Guild, MD as Consulting Physician (Cardiology)  Indicate any recent Medical Services you may have received from other than [facility] providers in the past year (date may be approximate).     Assessment:   This is a routine wellness examination for [place].  Hearing/Vision screen Hearing Screening - Comments:: C/o mild hearing difficulties - declines hearing aids Vision Screening - Comments:: Wears eyeglasses - up to date with annual eye exam at [facility] issues and exercise activities discussed: Current Exercise  Habits: Home exercise routine, Type of exercise: walking, Time (Minutes): 10, Frequency (Times/Week): 7, Weekly Exercise (Minutes/Week): 70, Intensity: Mild, Exercise limited by: cardiac condition(s);orthopedic condition(s)   Goals Addressed             This Visit's Progress    DIET - INCREASE WATER INTAKE   On track    Try to drink 6-8 glasses of water daily     Prevent falls         Depression Screen PHQ 2/9 Scores 12/11/2020 12/09/2020 06/11/2020 12/10/2019 07/10/2019 06/11/2019 01/03/2019  PHQ - 2 Score 1 2 0 0 0 0 0  PHQ- 9 Score 4 7 - - - 0 0  Exception Documentation - - - - - - -    Fall Risk Fall Risk  12/11/2020 12/09/2020 06/11/2020 04/24/2020 12/10/2019  Falls in the past year? 1 1 0 1 0  Number falls in past yr: 1 1 - 1 -  Injury with Fall? 0 0 - 1 -  Risk for fall due to : History of fall(s);Impaired balance/gait;Impaired vision;Orthopedic patient;Mental status change - - History of fall(s) -  Risk for fall due to: Comment - - - - -  Follow up Education provided;Falls prevention discussed Falls prevention discussed - Falls evaluation completed -    FALL RISK PREVENTION PERTAINING TO THE HOME:  Any stairs in or around the home? Yes  If so, are there any without handrails? No  Home free of loose throw rugs in walkways, pet beds, electrical cords, etc? Yes  Adequate lighting in your home to reduce risk of falls? Yes   ASSISTIVE DEVICES UTILIZED TO PREVENT FALLS:  Life alert? No  Use of a cane, walker or w/c? Yes  Grab bars in the bathroom? Yes  Shower chair or bench in shower? No  Elevated toilet seat or a handicapped toilet? No   TIMED UP AND GO:  Was the test performed? No . Telephonic visit   Cognitive Function:     6CIT Screen 12/11/2020 07/10/2019  What Year? 0 points 0 points  What month? 0 points 0 points  What time? 0 points 0 points  Count back from 20 0 points 0 points  Months in reverse 4 points 4 points  Repeat phrase 6 points 2 points  Total Score  10 6    Immunizations Immunization History  Administered Date(s) Administered   Fluad Quad(high Dose 65+) 02/25/2019   Influenza-Unspecified 03/08/2017, 02/25/2019, 03/25/2020   Moderna SARS-COV2 Booster Vaccination 03/28/2020   Moderna Sars-Covid-2 Vaccination 06/15/2019, 07/18/2019   Pneumococcal Conjugate-13 08/09/2016   Pneumococcal Polysaccharide-23 08/25/2017, 01/23/2018   Tdap 03/12/2019   Zoster Recombinat (Shingrix) 01/04/2019, 04/06/2019    TDAP status: Up to date  Flu Vaccine status: Up to date  Pneumococcal vaccine status: Up to date  Covid-19 vaccine status: Completed vaccines  Qualifies for Shingles Vaccine? Yes   Zostavax completed Yes   Shingrix Completed?: Yes  Screening Tests Health Maintenance  Topic Date Due   COVID-19 Vaccine (4 - Booster for Moderna series) 12/25/2020 (Originally 07/26/2020)   INFLUENZA VACCINE  12/21/2020   TETANUS/TDAP  03/11/2029   PNA vac Low Risk Adult  Completed   Zoster Vaccines- Shingrix  Completed   HPV VACCINES  Aged Out    Health Maintenance  There are no preventive care reminders to display for this patient.  Colorectal cancer screening: No longer required.   Lung Cancer Screening: (Low Dose CT Chest recommended if Age 63-80 years, 30 pack-year currently smoking OR have quit w/in 15years.) does not qualify.   Additional Screening:  Hepatitis C Screening: does not qualify  Vision Screening: Recommended annual ophthalmology exams for early detection of glaucoma and other disorders of the eye. Is the patient up to date with their annual eye exam?  Yes  Who is the provider or what is the name of the office in which the patient attends annual eye exams? [place] If pt is not established with a provider, would they like to be referred to a provider to establish care? No .   Dental Screening: Recommended annual dental exams for proper oral hygiene  Community Resource Referral / Chronic Care Management: CRR  required this visit?  No   CCM required this visit?  No      Plan:     I have personally reviewed and noted the following in the patient's chart:   Medical and social history Use of alcohol, tobacco or illicit drugs  Current medications and supplements including opioid prescriptions. Patient is not currently taking opioid prescriptions. Functional ability and status Nutritional status Physical activity Advanced directives List of other physicians Hospitalizations, surgeries, and ER visits in previous 12 months Vitals Screenings to include cognitive, depression, and falls Referrals and appointments  In addition, I have reviewed and discussed with patient certain preventive protocols, quality metrics, and best practice recommendations. A written personalized care plan for preventive services as well as general preventive health recommendations were provided to patient.     Sandrea Hammond, LPN   01/03/4817   Nurse Notes: None

## 2020-12-11 NOTE — Patient Instructions (Signed)
[name] , Thank you for taking time to come for your Medicare Wellness Visit. I appreciate your ongoing commitment to your health goals. Please review the following plan we discussed and let me know if I can assist you in the future.   Screening recommendations/referrals: Colonoscopy: No longer requied Recommended yearly ophthalmology/optometry visit for glaucoma screening and checkup Recommended yearly dental visit for hygiene and checkup  Vaccinations: Influenza vaccine: Done 03/25/2020 - Repeat annually  Pneumococcal vaccine: Done 08/09/2016 & 01/23/2018 Tdap vaccine: Done 03/12/2019 - Repeat in 10 years  Shingles vaccine: Done 01/04/2019 & 04/06/2019   Covid-19: Done 06/15/19, 07/18/19, & 03/28/2020 - due for second booster  Advanced directives: Please bring a copy of your health care power of attorney and living will to the office to be added to your chart at your convenience.   Conditions/risks identified: Aim for 30 minutes of exercise each day, drink 6-8 glasses of water and eat lots of fruits and vegetables.   Next appointment: Follow up in one year for your annual wellness visit.   Preventive Care 27 Years and Older, Male  Preventive care refers to lifestyle choices and visits with your health care provider that can promote health and wellness. What does preventive care include? A yearly physical exam. This is also called an annual well check. Dental exams once or twice a year. Routine eye exams. Ask your health care provider how often you should have your eyes checked. Personal lifestyle choices, including: Daily care of your teeth and gums. Regular physical activity. Eating a healthy diet. Avoiding tobacco and drug use. Limiting alcohol use. Practicing safe sex. Taking low doses of aspirin every day. Taking vitamin and mineral supplements as recommended by your health care provider. What happens during an annual well check? The services and screenings done by your health  care provider during your annual well check will depend on your age, overall health, lifestyle risk factors, and family history of disease. Counseling  Your health care provider may ask you questions about your: Alcohol use. Tobacco use. Drug use. Emotional well-being. Home and relationship well-being. Sexual activity. Eating habits. History of falls. Memory and ability to understand (cognition). Work and work Statistician. Screening  You may have the following tests or measurements: Height, weight, and BMI. Blood pressure. Lipid and cholesterol levels. These may be checked every 5 years, or more frequently if you are over 51 years old. Skin check. Lung cancer screening. You may have this screening every year starting at age 39 if you have a 30-pack-year history of smoking and currently smoke or have quit within the past 15 years. Fecal occult blood test (FOBT) of the stool. You may have this test every year starting at age 72. Flexible sigmoidoscopy or colonoscopy. You may have a sigmoidoscopy every 5 years or a colonoscopy every 10 years starting at age 65. Prostate cancer screening. Recommendations will vary depending on your family history and other risks. Hepatitis C blood test. Hepatitis B blood test. Sexually transmitted disease (STD) testing. Diabetes screening. This is done by checking your blood sugar (glucose) after you have not eaten for a while (fasting). You may have this done every 1-3 years. Abdominal aortic aneurysm (AAA) screening. You may need this if you are a current or former smoker. Osteoporosis. You may be screened starting at age 52 if you are at high risk. Talk with your health care provider about your test results, treatment options, and if necessary, the need for more tests. Vaccines  Your health  care provider may recommend certain vaccines, such as: Influenza vaccine. This is recommended every year. Tetanus, diphtheria, and acellular pertussis (Tdap, Td)  vaccine. You may need a Td booster every 10 years. Zoster vaccine. You may need this after age 47. Pneumococcal 13-valent conjugate (PCV13) vaccine. One dose is recommended after age 34. Pneumococcal polysaccharide (PPSV23) vaccine. One dose is recommended after age 45. Talk to your health care provider about which screenings and vaccines you need and how often you need them. This information is not intended to replace advice given to you by your health care provider. Make sure you discuss any questions you have with your health care provider. Document Released: 06/05/2015 Document Revised: 01/27/2016 Document Reviewed: 03/10/2015 Elsevier Interactive Patient Education  2017 [place] Prevention in the Home Falls can cause injuries. They can happen to people of all ages. There are many things you can do to make your home safe and to help prevent falls. What can I do on the outside of my home? Regularly fix the edges of walkways and driveways and fix any cracks. Remove anything that might make you trip as you walk through a door, such as a raised step or threshold. Trim any bushes or trees on the path to your home. Use bright outdoor lighting. Clear any walking paths of anything that might make someone trip, such as rocks or tools. Regularly check to see if handrails are loose or broken. Make sure that both sides of any steps have handrails. Any raised decks and porches should have guardrails on the edges. Have any leaves, snow, or ice cleared regularly. Use sand or salt on walking paths during winter. Clean up any spills in your garage right away. This includes oil or grease spills. What can I do in the bathroom? Use night lights. Install grab bars by the toilet and in the tub and shower. Do not use towel bars as grab bars. Use non-skid mats or decals in the tub or shower. If you need to sit down in the shower, use a plastic, non-slip stool. Keep the floor dry. Clean up any  water that spills on the floor as soon as it happens. Remove soap buildup in the tub or shower regularly. Attach bath mats securely with double-sided non-slip rug tape. Do not have throw rugs and other things on the floor that can make you trip. What can I do in the bedroom? Use night lights. Make sure that you have a light by your bed that is easy to reach. Do not use any sheets or blankets that are too big for your bed. They should not hang down onto the floor. Have a firm chair that has side arms. You can use this for support while you get dressed. Do not have throw rugs and other things on the floor that can make you trip. What can I do in the kitchen? Clean up any spills right away. Avoid walking on wet floors. Keep items that you use a lot in easy-to-reach places. If you need to reach something above you, use a strong step stool that has a grab bar. Keep electrical cords out of the way. Do not use floor polish or wax that makes floors slippery. If you must use wax, use non-skid floor wax. Do not have throw rugs and other things on the floor that can make you trip. What can I do with my stairs? Do not leave any items on the stairs. Make sure that there are handrails on  both sides of the stairs and use them. Fix handrails that are broken or loose. Make sure that handrails are as long as the stairways. Check any carpeting to make sure that it is firmly attached to the stairs. Fix any carpet that is loose or worn. Avoid having throw rugs at the top or bottom of the stairs. If you do have throw rugs, attach them to the floor with carpet tape. Make sure that you have a light switch at the top of the stairs and the bottom of the stairs. If you do not have them, ask someone to add them for you. What else can I do to help prevent falls? Wear shoes that: Do not have high heels. Have rubber bottoms. Are comfortable and fit you well. Are closed at the toe. Do not wear sandals. If you use a  stepladder: Make sure that it is fully opened. Do not climb a closed stepladder. Make sure that both sides of the stepladder are locked into place. Ask someone to hold it for you, if possible. Clearly mark and make sure that you can see: Any grab bars or handrails. First and last steps. Where the edge of each step is. Use tools that help you move around (mobility aids) if they are needed. These include: Canes. Walkers. Scooters. Crutches. Turn on the lights when you go into a dark area. Replace any light bulbs as soon as they burn out. Set up your furniture so you have a clear path. Avoid moving your furniture around. If any of your floors are uneven, fix them. If there are any pets around you, be aware of where they are. Review your medicines with your doctor. Some medicines can make you feel dizzy. This can increase your chance of falling. Ask your doctor what other things that you can do to help prevent falls. This information is not intended to replace advice given to you by your health care provider. Make sure you discuss any questions you have with your health care provider. Document Released: 03/05/2009 Document Revised: 10/15/2015 Document Reviewed: 06/13/2014 Elsevier Interactive Patient Education  2017 [employer].

## 2021-01-15 ENCOUNTER — Other Ambulatory Visit: Payer: Self-pay

## 2021-01-15 ENCOUNTER — Encounter: Payer: Self-pay | Admitting: Nurse Practitioner

## 2021-01-15 ENCOUNTER — Ambulatory Visit (INDEPENDENT_AMBULATORY_CARE_PROVIDER_SITE_OTHER): Payer: Medicare Other | Admitting: Nurse Practitioner

## 2021-01-15 VITALS — BP 123/69 | HR 61 | Temp 97.4°F | Ht 71.0 in | Wt 156.0 lb

## 2021-01-15 DIAGNOSIS — F321 Major depressive disorder, single episode, moderate: Secondary | ICD-10-CM

## 2021-01-15 DIAGNOSIS — R221 Localized swelling, mass and lump, neck: Secondary | ICD-10-CM | POA: Insufficient documentation

## 2021-01-15 MED ORDER — ESCITALOPRAM OXALATE 10 MG PO TABS: 10.0000 mg | ORAL_TABLET | Freq: Every day | ORAL | 5 refills | Status: DC

## 2021-01-15 NOTE — Patient Instructions (Signed)
http://APA.org/depression-guideline"> https://clinicalkey.com"> http://point-of-care.elsevierperformancemanager.com/skills/"> http://point-of-care.elsevierperformancemanager.com">  Managing Depression, Adult Depression is a mental health condition that affects your thoughts, feelings, and actions. Being diagnosed with depression can bring you relief if you did not know why you have felt or behaved a certain way. It could also leave you feeling overwhelmed with uncertainty about your future. Preparing yourself tomanage your symptoms can help you feel more positive about your future. How to manage lifestyle changes Managing stress  Stress is your body's reaction to life changes and events, both good and bad. Stress can add to your feelings of depression. Learning to manage your stresscan help lessen your feelings of depression. Try some of the following approaches to reducing your stress (stress reduction techniques): Listen to music that you enjoy and that inspires you. Try using a meditation app or take a meditation class. Develop a practice that helps you connect with your spiritual self. Walk in nature, pray, or go to a place of worship. Do some deep breathing. To do this, inhale slowly through your nose. Pause at the top of your inhale for a few seconds and then exhale slowly, letting your muscles relax. Practice yoga to help relax and work your muscles. Choose a stress reduction technique that suits your lifestyle and personality. These techniques take time and practice to develop. Set aside 5-15 minutes a day to do them. Therapists can offer training in these techniques. Other things you can do to manage stress include: Keeping a stress diary. Knowing your limits and saying no when you think something is too much. Paying attention to how you react to certain situations. You may not be able to control everything, but you can change your reaction. Adding humor to your life by watching funny films  or TV shows. Making time for activities that you enjoy and that relax you.  Medicines Medicines, such as antidepressants, are often a part of treatment for depression. Talk with your pharmacist or health care provider about all the medicines, supplements, and herbal products that you take, their possible side effects, and what medicines and other products are safe to take together. Make sure to report any side effects you may have to your health care provider. Relationships Your health care provider may suggest family therapy, couples therapy, orindividual therapy as part of your treatment. How to recognize changes Everyone responds differently to treatment for depression. As you recover from depression, you may start to: Have more interest in doing activities. Feel less hopeless. Have more energy. Overeat less often, or have a better appetite. Have better mental focus. It is important to recognize if your depression is not getting better or is getting worse. The symptoms you had in the beginning may return, such as: Tiredness (fatigue) or low energy. Eating too much or too little. Sleeping too much or too little. Feeling restless, agitated, or hopeless. Trouble focusing or making decisions. Unexplained physical complaints. Feeling irritable, angry, or aggressive. If you or your family members notice these symptoms coming back, let yourhealth care provider know right away. Follow these instructions at home: Activity  Try to get some form of exercise each day, such as walking, biking, swimming, or lifting weights. Practice stress reduction techniques. Engage your mind by taking a class or doing some volunteer work.  Lifestyle Get the right amount and quality of sleep. Cut down on using caffeine, tobacco, alcohol, and other potentially harmful substances. Eat a healthy diet that includes plenty of vegetables, fruits, whole grains, low-fat dairy products, and lean protein. Do not  eat  a lot of foods that are high in solid fats, added sugars, or salt (sodium). General instructions Take over-the-counter and prescription medicines only as told by your health care provider. Keep all follow-up visits as told by your health care provider. This is important. Where to find support Talking to others  Friends and family members can be sources of support and guidance. Talk to trusted friends or family members about your condition. Explain your symptoms to them, and let them know that you are working with a health care provider to treat your depression. Tell friends and family members how they also can behelpful. Finances Find appropriate mental health providers that fit with your financial situation. Talk with your health care provider about options to get reduced prices on your medicines. Where to find more information You can find support in your area from: Anxiety and Depression Association of America (ADAA): www.adaa.org Mental Health America: www.mentalhealthamerica.net [employer] on Mental Illness: www.nami.org Contact a health care provider if: You stop taking your antidepressant medicines, and you have any of these symptoms: Nausea. Headache. Light-headedness. Chills and body aches. Not being able to sleep (insomnia). You or your friends and family think your depression is getting worse. Get help right away if: You have thoughts of hurting yourself or others. If you ever feel like you may hurt yourself or others, or have thoughts about taking your own life, get help right away. Go to your nearest emergency department or: Call your local emergency services (911 in the [place] Call a suicide crisis helpline, such as the [facility] at 587-745-4227. This is open 24 hours a day in the [place] Text the Crisis Text Line at 228-488-1672 (in the [place].). Summary If you are diagnosed with depression, preparing yourself to manage your symptoms is a good way  to feel positive about your future. Work with your health care provider on a management plan that includes stress reduction techniques, medicines (if applicable), therapy, and healthy lifestyle habits. Keep talking with your health care provider about how your treatment is working. If you have thoughts about taking your own life, call a suicide crisis helpline or text a crisis text line. This information is not intended to replace advice given to you by your health care provider. Make sure you discuss any questions you have with your healthcare provider. Document Revised: 03/20/2019 Document Reviewed: 03/20/2019 Elsevier Patient Education  2022 [employer].

## 2021-01-15 NOTE — Assessment & Plan Note (Signed)
New nodule behind neck on right side, patient is not aware how long nodule has been present, no pain, itch, tenderness or redness associated with symptoms. Completed referral to dermatology. Education provided to patient and he knows to follow up with worsening unresolved symptoms.

## 2021-01-15 NOTE — Assessment & Plan Note (Signed)
Patient is experiencing depression but has never been treated, symptoms have progressively worse in the last 3 years after the loss of his spouse. Patient is will to try an antidepressant and follow up in 6 weeks. Completed PHQ-9, education provided on depression management with printed hand out given,   RX sent to pharmacy.

## 2021-01-15 NOTE — Progress Notes (Signed)
Acute Office Visit  Subjective:    Patient ID: Paul Snyder, male    DOB: 1935-11-07, 85 y.o.   MRN: [id number]  Chief Complaint  Patient presents with   knot on neck    HPI Patient is in today for new nodule behind neck on right side, patient is not aware how long nodule has been present, no pain, itch, tenderness or redness associated with symptoms.  Depression: Patient complains of depression. He complains of depressed mood and hopelessness. Onset was approximately a few months ago, unchanged since that time.  He denies current suicidal and homicidal plan or intent.   Family history significant for no psychiatric illness.Possible organic causes contributing are: none.  Risk factors: negative life event loss of spouse  Previous treatment includes  none  and none. He complains of the following side effects from the treatment: none.    [place] Office Visit from 01/15/2021 in [employer]  PHQ-9 Total Score 19        Past Medical History:  Diagnosis Date   CHF (congestive heart failure) (HCC)    COPD (chronic obstructive pulmonary disease) (HCC)    GERD (gastroesophageal reflux disease)    Lung cancer ([place]) 2009   NSTEMI (non-ST elevated myocardial infarction) ([place]) 07/16/2017   2/19 PCI/DES x1 to Lcx with moderate (50%) in the RCA, mild (20%) in LAD    Past Surgical History:  Procedure Laterality Date   ANKLE FRACTURE SURGERY Right 1970s   CATARACT EXTRACTION Left    CORONARY ANGIOPLASTY WITH STENT PLACEMENT  07/19/2017   CORONARY STENT INTERVENTION N/A 07/19/2017   Procedure: CORONARY STENT INTERVENTION;  Surgeon: [name], MD;  Location: [facility];  Service: Cardiovascular;  Laterality: N/A;   LEFT HEART CATH AND CORONARY ANGIOGRAPHY N/A 07/19/2017   Procedure: LEFT HEART CATH AND CORONARY ANGIOGRAPHY;  Surgeon: [name], MD;  Location: [facility];  Service: Cardiovascular;  Laterality: N/A;   LUNG BIOPSY       Family History  Problem Relation Age of Onset   Healthy Mother    Breast cancer Sister    Heart disease Brother    Heart failure Brother    Breast cancer Sister    Hypertension Son    Hyperlipidemia Son     Social History   Socioeconomic History   Marital status: Widowed    Spouse name: Not on file   Number of children: 2   Years of education: 7   Highest education level: 7th grade  Occupational History   Occupation: retired  Tobacco Use   Smoking status: Former    Packs/day: 1.00    Years: 42.00    Pack years: 42.00    Types: Cigarettes    Quit date: 1996    Years since quitting: 26.6   Smokeless tobacco: Never  Vaping Use   Vaping Use: Never used  Substance and Sexual Activity   Alcohol use: No   Drug use: Not Currently   Sexual activity: Not Currently    Birth control/protection: None  Other Topics Concern   Not on file  Social History Narrative   Lives by himself, son lives next door, daughter 15 minutes away   Social Determinants of Health   Financial Resource Strain: Low Risk    Difficulty of Paying Living Expenses: Not hard at all  Food Insecurity: No Food Insecurity   Worried About Charity fundraiser in the Last Year: Never true  Ran Out of Food in the Last Year: Never true  Transportation Needs: No Transportation Needs   Lack of Transportation (Medical): No   Lack of Transportation (Non-Medical): No  Physical Activity: Insufficiently Active   Days of Exercise per Week: 7 days   Minutes of Exercise per Session: 10 min  Stress: No Stress Concern Present   Feeling of Stress : Not at all  Social Connections: Moderately Integrated   Frequency of Communication with Friends and Family: More than three times a week   Frequency of Social Gatherings with Friends and Family: More than three times a week   Attends Religious Services: More than 4 times per year   Active Member of [employer] or Organizations: Yes   Attends Archivist Meetings:  More than 4 times per year   Marital Status: Widowed  Human resources officer Violence: Not At Risk   Fear of Current or Ex-Partner: No   Emotionally Abused: No   Physically Abused: No   Sexually Abused: No    Outpatient Medications Prior to Visit  Medication Sig Dispense Refill   acetaminophen (TYLENOL) 325 MG tablet Take 2 tablets (650 mg total) by mouth every 6 (six) hours as needed for mild pain (or Fever >/= 101). 12 tablet 0   albuterol (VENTOLIN HFA) 108 (90 Base) MCG/ACT inhaler Inhale 2 puffs into the lungs every 6 (six) hours as needed for wheezing or shortness of breath. 18 g 2   aspirin 81 MG EC tablet Take 1 tablet (81 mg total) by mouth daily with breakfast. 30 tablet 12   atorvastatin (LIPITOR) 80 MG tablet Take 1 tablet (80 mg total) by mouth every evening. 90 tablet 1   furosemide (LASIX) 20 MG tablet Take 1 tablet (20 mg total) by mouth every morning. 90 tablet 1   niacin 500 MG tablet Take 500 mg by mouth every morning.      pantoprazole (PROTONIX) 40 MG tablet Take 1 tablet (40 mg total) by mouth daily. 90 tablet 1   No facility-administered medications prior to visit.    No Known Allergies  Review of Systems  Constitutional: Negative.   HENT: Negative.    Respiratory: Negative.    Genitourinary: Negative.   Musculoskeletal: Negative.   Skin:  Negative for rash.  Neurological: Negative.   All other systems reviewed and are negative.     Objective:    Physical Exam Vitals and nursing note reviewed. Exam conducted with a chaperone present (son).  HENT:     Head: Normocephalic.     Right Ear: Ear canal normal.     Nose: Nose normal.     Mouth/Throat:     Mouth: Mucous membranes are moist.     Pharynx: Oropharynx is clear.  Eyes:     Conjunctiva/sclera: Conjunctivae normal.  Cardiovascular:     Rate and Rhythm: Normal rate and regular rhythm.  Pulmonary:     Effort: Pulmonary effort is normal.  Skin:    General: Skin is dry.  Neurological:     Mental  Status: He is alert and oriented to person, place, and time.  Psychiatric:        Attention and Perception: Attention and perception normal.        Mood and Affect: Affect normal. Mood is depressed.        Speech: Speech normal.        Behavior: Behavior normal. Behavior is cooperative.        Thought Content: Thought content does not include  suicidal ideation.    BP 123/69   Pulse 61   Temp (!) 97.4 F (36.3 C) (Temporal)   Ht 5' 11"  (1.803 m)   Wt 156 lb (70.8 kg)   SpO2 93%   BMI 21.76 kg/m  Wt Readings from Last 3 Encounters:  01/15/21 156 lb (70.8 kg)  12/11/20 170 lb (77.1 kg)  06/11/20 164 lb (74.4 kg)    Health Maintenance Due  Topic Date Due   COVID-19 Vaccine (4 - Booster for Moderna series) 07/26/2020   INFLUENZA VACCINE  12/21/2020    There are no preventive care reminders to display for this patient.   No results found for: TSH Lab Results  Component Value Date   WBC 4.4 12/09/2020   HGB 12.8 (L) 12/09/2020   HCT 39.1 12/09/2020   MCV 86 12/09/2020   PLT 144 (L) 12/09/2020   Lab Results  Component Value Date   NA 141 12/09/2020   K 4.4 12/09/2020   CO2 27 12/09/2020   GLUCOSE 91 12/09/2020   BUN 11 12/09/2020   CREATININE 0.98 12/09/2020   BILITOT 0.5 12/09/2020   ALKPHOS 66 12/09/2020   AST 12 12/09/2020   ALT 7 12/09/2020   PROT 6.0 12/09/2020   ALBUMIN 3.8 12/09/2020   CALCIUM 9.6 12/09/2020   ANIONGAP 10 05/20/2019   EGFR 77 12/09/2020   Lab Results  Component Value Date   CHOL 105 12/09/2020   Lab Results  Component Value Date   HDL 46 12/09/2020   Lab Results  Component Value Date   LDLCALC 48 12/09/2020   Lab Results  Component Value Date   TRIG 40 12/09/2020   Lab Results  Component Value Date   CHOLHDL 2.3 12/09/2020   Lab Results  Component Value Date   HGBA1C 4.8 05/16/2019       Assessment & Plan:   Problem List Items Addressed This Visit       Other   Depression, major, single episode, moderate ([place])     Patient is experiencing depression but has never been treated, symptoms have progressively worse in the last 3 years after the loss of his spouse. Patient is will to try an antidepressant and follow up in 6 weeks. Completed PHQ-9, education provided on depression management with printed hand out given,   RX sent to pharmacy.       Relevant Medications   escitalopram (LEXAPRO) 10 MG tablet   Nodule of neck - Primary    New nodule behind neck on right side, patient is not aware how long nodule has been present, no pain, itch, tenderness or redness associated with symptoms. Completed referral to dermatology. Education provided to patient and he knows to follow up with worsening unresolved symptoms.         Relevant Orders   Ambulatory referral to Dermatology     Meds ordered this encounter  Medications   escitalopram (LEXAPRO) 10 MG tablet    Sig: Take 1 tablet (10 mg total) by mouth daily.    Dispense:  30 tablet    Refill:  5    Order Specific Question:   Supervising Provider    Answer:   Janora Norlander [[id number]]     Ivy Lynn, NP

## 2021-02-26 ENCOUNTER — Other Ambulatory Visit: Payer: Self-pay

## 2021-02-26 ENCOUNTER — Encounter: Payer: Self-pay | Admitting: Family Medicine

## 2021-02-26 ENCOUNTER — Ambulatory Visit (INDEPENDENT_AMBULATORY_CARE_PROVIDER_SITE_OTHER): Payer: Medicare Other | Admitting: Family Medicine

## 2021-02-26 VITALS — BP 109/57 | HR 54 | Temp 96.7°F | Ht 71.0 in | Wt 153.0 lb

## 2021-02-26 DIAGNOSIS — R1319 Other dysphagia: Secondary | ICD-10-CM

## 2021-02-26 DIAGNOSIS — J449 Chronic obstructive pulmonary disease, unspecified: Secondary | ICD-10-CM

## 2021-02-26 DIAGNOSIS — F321 Major depressive disorder, single episode, moderate: Secondary | ICD-10-CM | POA: Diagnosis not present

## 2021-02-26 DIAGNOSIS — Z7689 Persons encountering health services in other specified circumstances: Secondary | ICD-10-CM

## 2021-02-26 DIAGNOSIS — E441 Mild protein-calorie malnutrition: Secondary | ICD-10-CM

## 2021-02-26 DIAGNOSIS — Z23 Encounter for immunization: Secondary | ICD-10-CM

## 2021-02-26 DIAGNOSIS — R634 Abnormal weight loss: Secondary | ICD-10-CM | POA: Diagnosis not present

## 2021-02-26 MED ORDER — ESCITALOPRAM OXALATE 20 MG PO TABS: 20.0000 mg | ORAL_TABLET | Freq: Every day | ORAL | 2 refills | Status: DC

## 2021-02-26 MED ORDER — SPIRIVA RESPIMAT 2.5 MCG/ACT IN AERS: 2.0000 | INHALATION_SPRAY | Freq: Every day | RESPIRATORY_TRACT | 2 refills | Status: DC

## 2021-02-26 NOTE — Progress Notes (Signed)
Assessment & Plan:  1. Depression, major, single episode, moderate (HCC) Improving. Lexapro dose increased from 10 mg to 20 mg.  - escitalopram (LEXAPRO) 20 MG tablet; Take 1 tablet (20 mg total) by mouth daily.  Dispense: 30 tablet; Refill: 2  2. COPD without exacerbation ([place]) Uncontrolled. Started Spiriva. Continue Albuterol as needed. - Tiotropium Bromide Monohydrate (SPIRIVA RESPIMAT) 2.5 MCG/ACT AERS; Inhale 2 puffs into the lungs daily.  Dispense: 4 g; Refill: 2  3. Esophageal dysphagia Discussed option of referring to gastroenterology for EGD but since this has only occurred twice in the past year, patient would like to wait to see if it starts to occur more frequently.  4. Sleep concern Discussed with son that if patient is at home alone all day with nothing to do he may sleep more. Encouraged him to interact more with dad to keep him awake during the day.  5-6. Unintentional weight loss/Malnutrition of mild degree (HCC) Encouraged Boost or Ensure for lunch since patient always skips lunch. Patient has lost 10% of his weight in the past year which does give him the diagnosis of malnutrition. Encouraged patient and his son to think about whether they wanted further work-up and treatment for this as it would be searching for a possible cancer. Education provided on unhealthy weight loss.  7. Need for immunization against influenza - Flu Vaccine QUAD High Dose(Fluad) - given in office.    Return in about 6 weeks (around 04/09/2021) for Depression, Weight, COPD.  Paul Limes, MSN, APRN, FNP-C [facility]  Subjective:    Patient ID: Paul Snyder, male    DOB: [birth year], 85 y.o.   MRN: [id number]  Patient Care Team: Loman Brooklyn, FNP as PCP - General (Family Medicine) [name] Alphonse Guild, MD as PCP - Cardiology (Cardiology) [name] Alphonse Guild, MD as Consulting Physician (Cardiology)   Chief Complaint:  Chief Complaint  Patient presents with    Depression    6 week follow up- patient states he is a little better.     HPI: Paul Snyder is a 85 y.o. male presenting on 02/26/2021 for Depression (6 week follow up- patient states he is a little better. )  Patient is accompanied by his son, who he is okay with being present.  Patient is following up on depression. He was started on Lexapro ~6 weeks ago which he feels is working. He feels it could still be better.  Depression screen [facility] 2/9 02/26/2021 01/15/2021 12/11/2020  Decreased Interest 1 3 0  Down, Depressed, Hopeless _0 PHQ - 2 Score _1 Altered sleeping 1 3 0  Tired, decreased energy _2 Change in appetite 1 1 0  Feeling bad or failure about yourself  1 3 0  Trouble concentrating 1 0 1  Moving slowly or fidgety/restless _3 Suicidal thoughts 0 0 0  PHQ-9 Score _4 Difficult doing work/chores Somewhat difficult Somewhat difficult Somewhat difficult  Some recent data might be hidden   New complaints: Patient's son is concerned about COPD, swallowing, sleep, and weight.  - COPD: patient coughs up phlegm on a regular basis. He wheezes 2-3 x/week. He is using his Albuterol 1x/week, but admits he should probably be using it more.    - Swallowing: patient has got strangled on food twice in the past year. Never on liquids. He does take pantoprazole daily.   - Sleep: son is concerned that patient sleeps  too much. He sleeps from 6 PM to 5:30 AM and takes an hour nap during the day. He lives alone. Patient states sometimes he falls asleep watching television but then wakes back up in 20-30 minutes.   - Weight: patient has been losing weight over the past year. He eats good for breakfast and dinner, but skips lunch daily. He does have a history of lung cancer.   Social history:  Relevant past medical, surgical, family and social history reviewed and updated as indicated. Interim medical history since our last visit reviewed.  Allergies and medications reviewed  and updated.  DATA REVIEWED: CHART IN EPIC  ROS: Negative unless specifically indicated above in HPI.    Current Outpatient Medications:    acetaminophen (TYLENOL) 325 MG tablet, Take 2 tablets (650 mg total) by mouth every 6 (six) hours as needed for mild pain (or Fever >/= 101)., Disp: 12 tablet, Rfl: 0   albuterol (VENTOLIN HFA) 108 (90 Base) MCG/ACT inhaler, Inhale 2 puffs into the lungs every 6 (six) hours as needed for wheezing or shortness of breath., Disp: 18 g, Rfl: 2   aspirin 81 MG EC tablet, Take 1 tablet (81 mg total) by mouth daily with breakfast., Disp: 30 tablet, Rfl: 12   atorvastatin (LIPITOR) 80 MG tablet, Take 1 tablet (80 mg total) by mouth every evening., Disp: 90 tablet, Rfl: 1   escitalopram (LEXAPRO) 10 MG tablet, Take 1 tablet (10 mg total) by mouth daily., Disp: 30 tablet, Rfl: 5   furosemide (LASIX) 20 MG tablet, Take 1 tablet (20 mg total) by mouth every morning., Disp: 90 tablet, Rfl: 1   niacin 500 MG tablet, Take 500 mg by mouth every morning. , Disp: , Rfl:    pantoprazole (PROTONIX) 40 MG tablet, Take 1 tablet (40 mg total) by mouth daily., Disp: 90 tablet, Rfl: 1   No Known Allergies Past Medical History:  Diagnosis Date   CHF (congestive heart failure) (HCC)    COPD (chronic obstructive pulmonary disease) (HCC)    GERD (gastroesophageal reflux disease)    Lung cancer ([place]) 2009   NSTEMI (non-ST elevated myocardial infarction) ([place]) 07/16/2017   2/19 PCI/DES x1 to Lcx with moderate (50%) in the RCA, mild (20%) in LAD    Past Surgical History:  Procedure Laterality Date   ANKLE FRACTURE SURGERY Right 1970s   CATARACT EXTRACTION Left    CORONARY ANGIOPLASTY WITH STENT PLACEMENT  07/19/2017   CORONARY STENT INTERVENTION N/A 07/19/2017   Procedure: CORONARY STENT INTERVENTION;  Surgeon: [name], MD;  Location: [facility];  Service: Cardiovascular;  Laterality: N/A;   LEFT HEART CATH AND CORONARY ANGIOGRAPHY N/A 07/19/2017   Procedure:  LEFT HEART CATH AND CORONARY ANGIOGRAPHY;  Surgeon: [name], MD;  Location: [facility];  Service: Cardiovascular;  Laterality: N/A;   LUNG BIOPSY      Social History   Socioeconomic History   Marital status: Widowed    Spouse name: Not on file   Number of children: 2   Years of education: 7   Highest education level: 7th grade  Occupational History   Occupation: retired  Tobacco Use   Smoking status: Former    Packs/day: 1.00    Years: 42.00    Pack years: 42.00    Types: Cigarettes    Quit date: 1996    Years since quitting: 26.7   Smokeless tobacco: Never  Vaping Use   Vaping Use: Never used  Substance and Sexual  Activity   Alcohol use: No   Drug use: Not Currently   Sexual activity: Not Currently    Birth control/protection: None  Other Topics Concern   Not on file  Social History Narrative   Lives by himself, son lives next door, daughter 15 minutes away   Social Determinants of Health   Financial Resource Strain: Low Risk    Difficulty of Paying Living Expenses: Not hard at all  Food Insecurity: No Food Insecurity   Worried About Charity fundraiser in the Last Year: Never true   Arboriculturist in the Last Year: Never true  Transportation Needs: No Transportation Needs   Lack of Transportation (Medical): No   Lack of Transportation (Non-Medical): No  Physical Activity: Insufficiently Active   Days of Exercise per Week: 7 days   Minutes of Exercise per Session: 10 min  Stress: No Stress Concern Present   Feeling of Stress : Not at all  Social Connections: Moderately Integrated   Frequency of Communication with Friends and Family: More than three times a week   Frequency of Social Gatherings with Friends and Family: More than three times a week   Attends Religious Services: More than 4 times per year   Active Member of [employer] or Organizations: Yes   Attends Archivist Meetings: More than 4 times per year   Marital Status: Widowed   Human resources officer Violence: Not At Risk   Fear of Current or Ex-Partner: No   Emotionally Abused: No   Physically Abused: No   Sexually Abused: No        Objective:    BP (!) 109/57   Pulse (!) 54   Temp (!) 96.7 F (35.9 C) (Temporal)   Ht _0  (1.803 m)   Wt 153 lb (69.4 kg)   SpO2 94%   BMI 21.34 kg/m   Wt Readings from Last 3 Encounters:  02/26/21 153 lb (69.4 kg)  01/15/21 156 lb (70.8 kg)  12/11/20 170 lb (77.1 kg)    Physical Exam Vitals reviewed.  Constitutional:      General: He is not in acute distress.    Appearance: Normal appearance. He is normal weight. He is not ill-appearing, toxic-appearing or diaphoretic.  HENT:     Head: Normocephalic and atraumatic.  Eyes:     General: No scleral icterus.       Right eye: No discharge.        Left eye: No discharge.     Conjunctiva/sclera: Conjunctivae normal.  Cardiovascular:     Rate and Rhythm: Normal rate.  Pulmonary:     Effort: Pulmonary effort is normal. No respiratory distress.  Musculoskeletal:        General: Normal range of motion.     Cervical back: Normal range of motion.  Skin:    General: Skin is warm and dry.  Neurological:     Mental Status: He is alert and oriented to person, place, and time. Mental status is at baseline.  Psychiatric:        Mood and Affect: Mood normal.        Behavior: Behavior normal.        Thought Content: Thought content normal.        Judgment: Judgment normal.    No results found for: TSH Lab Results  Component Value Date   WBC 4.4 12/09/2020   HGB 12.8 (L) 12/09/2020   HCT 39.1 12/09/2020   MCV 86 12/09/2020   PLT  144 (L) 12/09/2020   Lab Results  Component Value Date   NA 141 12/09/2020   K 4.4 12/09/2020   CO2 27 12/09/2020   GLUCOSE 91 12/09/2020   BUN 11 12/09/2020   CREATININE 0.98 12/09/2020   BILITOT 0.5 12/09/2020   ALKPHOS 66 12/09/2020   AST 12 12/09/2020   ALT 7 12/09/2020   PROT 6.0 12/09/2020   ALBUMIN 3.8 12/09/2020    CALCIUM 9.6 12/09/2020   ANIONGAP 10 05/20/2019   EGFR 77 12/09/2020   Lab Results  Component Value Date   CHOL 105 12/09/2020   Lab Results  Component Value Date   HDL 46 12/09/2020   Lab Results  Component Value Date   LDLCALC 48 12/09/2020   Lab Results  Component Value Date   TRIG 40 12/09/2020   Lab Results  Component Value Date   CHOLHDL 2.3 12/09/2020   Lab Results  Component Value Date   HGBA1C 4.8 05/16/2019

## 2021-02-26 NOTE — Patient Instructions (Signed)
Please start drinking a supplement (Boost or Ensure) daily for lunch.

## 2021-02-28 DIAGNOSIS — R634 Abnormal weight loss: Secondary | ICD-10-CM | POA: Insufficient documentation

## 2021-02-28 DIAGNOSIS — R1319 Other dysphagia: Secondary | ICD-10-CM | POA: Insufficient documentation

## 2021-02-28 DIAGNOSIS — E441 Mild protein-calorie malnutrition: Secondary | ICD-10-CM | POA: Insufficient documentation

## 2021-04-14 ENCOUNTER — Ambulatory Visit (INDEPENDENT_AMBULATORY_CARE_PROVIDER_SITE_OTHER): Payer: Medicare Other | Admitting: Family Medicine

## 2021-04-14 ENCOUNTER — Encounter: Payer: Self-pay | Admitting: Family Medicine

## 2021-04-14 ENCOUNTER — Other Ambulatory Visit: Payer: Self-pay

## 2021-04-14 VITALS — BP 96/47 | HR 51 | Temp 97.1°F

## 2021-04-14 DIAGNOSIS — I959 Hypotension, unspecified: Secondary | ICD-10-CM

## 2021-04-14 DIAGNOSIS — J449 Chronic obstructive pulmonary disease, unspecified: Secondary | ICD-10-CM

## 2021-04-14 DIAGNOSIS — F321 Major depressive disorder, single episode, moderate: Secondary | ICD-10-CM | POA: Diagnosis not present

## 2021-04-14 DIAGNOSIS — R634 Abnormal weight loss: Secondary | ICD-10-CM

## 2021-04-14 DIAGNOSIS — Z23 Encounter for immunization: Secondary | ICD-10-CM | POA: Diagnosis not present

## 2021-04-14 DIAGNOSIS — I509 Heart failure, unspecified: Secondary | ICD-10-CM

## 2021-04-14 MED ORDER — FUROSEMIDE 20 MG PO TABS: 20.0000 mg | ORAL_TABLET | Freq: Every day | ORAL | 1 refills | Status: AC | PRN

## 2021-04-14 NOTE — Progress Notes (Signed)
Assessment & Plan:  1. Depression, major, single episode, moderate ([place]) Well controlled on current regimen.   2. COPD without exacerbation ([place]) Encouraged to start using Spiriva daily and continue Albuterol as needed. Prevnar 20 given in office today.  3. Unintentional weight loss Continue drinking Ensure when not eating a meal.  4. Hypotension, unspecified hypotension type Soft blood pressure recently. Changed furosemide to as needed.  5. Chronic congestive heart failure, unspecified heart failure type [facility]) Patient denies shortness of breath and swelling; changed furosemide to as needed to see if this improves his blood pressure. - furosemide (LASIX) 20 MG tablet; Take 1 tablet (20 mg total) by mouth daily as needed (swelling or shortness of breath).  Dispense: 90 tablet; Refill: 1  6. Immunization due Prevnar 20 given in office today.   Return in about 3 months (around 07/15/2021) for follow-up of chronic medication conditions.  Hendricks Limes, MSN, APRN, FNP-C [facility]  Subjective:    Patient ID: Paul Snyder, male    DOB: 10-13-1935, 85 y.o.   MRN: [id number]  Patient Care Team: Loman Brooklyn, FNP as PCP - General (Family Medicine) [name], Alphonse Guild, MD as PCP - Cardiology (Cardiology) [name] Alphonse Guild, MD as Consulting Physician (Cardiology)   Chief Complaint:  Chief Complaint  Patient presents with   Depression    6 week follow up     HPI: Paul Snyder is a 85 y.o. male presenting on 04/14/2021 for Depression (6 week follow up )  Patient is accompanied by his daughter, who he is okay with being present.  Patient is following up on depression. At our last visit his Lexapro was increased to 20 mg, which he reports is working well for him.   Depression screen [facility] 2/9 04/14/2021 02/26/2021 01/15/2021  Decreased Interest _0 Down, Depressed, Hopeless _1 PHQ - 2 Score _2 Altered sleeping _3 Tired, decreased  energy _4 Change in appetite _5 Feeling bad or failure about yourself  0 1 3  Trouble concentrating 0 1 0  Moving slowly or fidgety/restless _6 Suicidal thoughts 0 0 0  PHQ-9 Score _7 Difficult doing work/chores Not difficult at all Somewhat difficult Somewhat difficult  Some recent data might be hidden    COPD: patient was prescribed Spiriva at our last visit due to cough and wheezing multiple times per week, which he has not started using because he does not feel he needs it.   Weight: patient has been drinking an Ensure daily for lunch since he does not eat a meal at lunch time. He is still eating a good breakfast and supper.   New complaints: None   Social history:  Relevant past medical, surgical, family and social history reviewed and updated as indicated. Interim medical history since our last visit reviewed.  Allergies and medications reviewed and updated.  DATA REVIEWED: CHART IN EPIC  ROS: Negative unless specifically indicated above in HPI.    Current Outpatient Medications:    acetaminophen (TYLENOL) 325 MG tablet, Take 2 tablets (650 mg total) by mouth every 6 (six) hours as needed for mild pain (or Fever >/= 101)., Disp: 12 tablet, Rfl: 0   albuterol (VENTOLIN HFA) 108 (90 Base) MCG/ACT inhaler, Inhale 2 puffs into the lungs every 6 (six) hours as needed for wheezing or shortness of breath., Disp: 18 g, Rfl: 2  aspirin 81 MG EC tablet, Take 1 tablet (81 mg total) by mouth daily with breakfast., Disp: 30 tablet, Rfl: 12   atorvastatin (LIPITOR) 80 MG tablet, Take 1 tablet (80 mg total) by mouth every evening., Disp: 90 tablet, Rfl: 1   escitalopram (LEXAPRO) 20 MG tablet, Take 1 tablet (20 mg total) by mouth daily., Disp: 30 tablet, Rfl: 2   furosemide (LASIX) 20 MG tablet, Take 1 tablet (20 mg total) by mouth every morning., Disp: 90 tablet, Rfl: 1   niacin 500 MG tablet, Take 500 mg by mouth every morning. , Disp: , Rfl:    pantoprazole  (PROTONIX) 40 MG tablet, Take 1 tablet (40 mg total) by mouth daily., Disp: 90 tablet, Rfl: 1   Tiotropium Bromide Monohydrate (SPIRIVA RESPIMAT) 2.5 MCG/ACT AERS, Inhale 2 puffs into the lungs daily., Disp: 4 g, Rfl: 2   No Known Allergies Past Medical History:  Diagnosis Date   CHF (congestive heart failure) (HCC)    COPD (chronic obstructive pulmonary disease) (HCC)    GERD (gastroesophageal reflux disease)    Lung cancer ([place]) 2009   NSTEMI (non-ST elevated myocardial infarction) ([place]) 07/16/2017   2/19 PCI/DES x1 to Lcx with moderate (50%) in the RCA, mild (20%) in LAD    Past Surgical History:  Procedure Laterality Date   ANKLE FRACTURE SURGERY Right 1970s   CATARACT EXTRACTION Left    CORONARY ANGIOPLASTY WITH STENT PLACEMENT  07/19/2017   CORONARY STENT INTERVENTION N/A 07/19/2017   Procedure: CORONARY STENT INTERVENTION;  Surgeon: [name], MD;  Location: [place] [facility];  Service: Cardiovascular;  Laterality: N/A;   LEFT HEART CATH AND CORONARY ANGIOGRAPHY N/A 07/19/2017   Procedure: LEFT HEART CATH AND CORONARY ANGIOGRAPHY;  Surgeon: [name], MD;  Location: [place] [facility];  Service: Cardiovascular;  Laterality: N/A;   LUNG BIOPSY      Social History   Socioeconomic History   Marital status: Widowed    Spouse name: Not on file   Number of children: 2   Years of education: 7   Highest education level: 7th grade  Occupational History   Occupation: retired  Tobacco Use   Smoking status: Former    Packs/day: 1.00    Years: 42.00    Pack years: 42.00    Types: Cigarettes    Quit date: 1996    Years since quitting: 26.9   Smokeless tobacco: Never  Vaping Use   Vaping Use: Never used  Substance and Sexual Activity   Alcohol use: No   Drug use: Not Currently   Sexual activity: Not Currently    Birth control/protection: None  Other Topics Concern   Not on file  Social History Narrative   Lives by himself, son lives next door, daughter 15  minutes away   Social Determinants of Health   Financial Resource Strain: Low Risk    Difficulty of Paying Living Expenses: Not hard at all  Food Insecurity: No Food Insecurity   Worried About Charity fundraiser in the Last Year: Never true   Arboriculturist in the Last Year: Never true  Transportation Needs: No Transportation Needs   Lack of Transportation (Medical): No   Lack of Transportation (Non-Medical): No  Physical Activity: Insufficiently Active   Days of Exercise per Week: 7 days   Minutes of Exercise per Session: 10 min  Stress: No Stress Concern Present   Feeling of Stress : Not at all  Social Connections: Moderately Integrated  Frequency of Communication with Friends and Family: More than three times a week   Frequency of Social Gatherings with Friends and Family: More than three times a week   Attends Religious Services: More than 4 times per year   Active Member of [employer] or Organizations: Yes   Attends Archivist Meetings: More than 4 times per year   Marital Status: Widowed  Human resources officer Violence: Not At Risk   Fear of Current or Ex-Partner: No   Emotionally Abused: No   Physically Abused: No   Sexually Abused: No        Objective:    BP (!) 96/47   Pulse (!) 51   Temp (!) 97.1 F (36.2 C) (Temporal)   SpO2 96%   Wt Readings from Last 3 Encounters:  02/26/21 153 lb (69.4 kg)  01/15/21 156 lb (70.8 kg)  12/11/20 170 lb (77.1 kg)    Physical Exam Vitals reviewed.  Constitutional:      General: He is not in acute distress.    Appearance: Normal appearance. He is normal weight. He is not ill-appearing, toxic-appearing or diaphoretic.  HENT:     Head: Normocephalic and atraumatic.  Eyes:     General: No scleral icterus.       Right eye: No discharge.        Left eye: No discharge.     Conjunctiva/sclera: Conjunctivae normal.  Cardiovascular:     Rate and Rhythm: Normal rate.  Pulmonary:     Effort: Pulmonary effort is normal. No  respiratory distress.  Musculoskeletal:        General: Normal range of motion.     Cervical back: Normal range of motion.  Skin:    General: Skin is warm and dry.  Neurological:     Mental Status: He is alert and oriented to person, place, and time. Mental status is at baseline.  Psychiatric:        Mood and Affect: Mood normal.        Behavior: Behavior normal.        Thought Content: Thought content normal.        Judgment: Judgment normal.    No results found for: TSH Lab Results  Component Value Date   WBC 4.4 12/09/2020   HGB 12.8 (L) 12/09/2020   HCT 39.1 12/09/2020   MCV 86 12/09/2020   PLT 144 (L) 12/09/2020   Lab Results  Component Value Date   NA 141 12/09/2020   K 4.4 12/09/2020   CO2 27 12/09/2020   GLUCOSE 91 12/09/2020   BUN 11 12/09/2020   CREATININE 0.98 12/09/2020   BILITOT 0.5 12/09/2020   ALKPHOS 66 12/09/2020   AST 12 12/09/2020   ALT 7 12/09/2020   PROT 6.0 12/09/2020   ALBUMIN 3.8 12/09/2020   CALCIUM 9.6 12/09/2020   ANIONGAP 10 05/20/2019   EGFR 77 12/09/2020   Lab Results  Component Value Date   CHOL 105 12/09/2020   Lab Results  Component Value Date   HDL 46 12/09/2020   Lab Results  Component Value Date   LDLCALC 48 12/09/2020   Lab Results  Component Value Date   TRIG 40 12/09/2020   Lab Results  Component Value Date   CHOLHDL 2.3 12/09/2020   Lab Results  Component Value Date   HGBA1C 4.8 05/16/2019

## 2021-05-27 ENCOUNTER — Other Ambulatory Visit: Payer: Self-pay | Admitting: Family Medicine

## 2021-05-27 DIAGNOSIS — F321 Major depressive disorder, single episode, moderate: Secondary | ICD-10-CM

## 2021-06-11 ENCOUNTER — Ambulatory Visit: Payer: Medicare Other | Admitting: Family Medicine

## 2021-07-15 ENCOUNTER — Telehealth: Payer: Self-pay | Admitting: Family Medicine

## 2021-07-15 ENCOUNTER — Encounter: Payer: Self-pay | Admitting: Family Medicine

## 2021-07-15 ENCOUNTER — Ambulatory Visit (INDEPENDENT_AMBULATORY_CARE_PROVIDER_SITE_OTHER): Payer: Medicare Other | Admitting: Family Medicine

## 2021-07-15 VITALS — BP 105/56 | HR 48 | Temp 96.9°F | Ht 71.0 in | Wt 148.2 lb

## 2021-07-15 DIAGNOSIS — I5189 Other ill-defined heart diseases: Secondary | ICD-10-CM | POA: Diagnosis not present

## 2021-07-15 DIAGNOSIS — I10 Essential (primary) hypertension: Secondary | ICD-10-CM

## 2021-07-15 DIAGNOSIS — J449 Chronic obstructive pulmonary disease, unspecified: Secondary | ICD-10-CM

## 2021-07-15 DIAGNOSIS — I252 Old myocardial infarction: Secondary | ICD-10-CM

## 2021-07-15 DIAGNOSIS — K219 Gastro-esophageal reflux disease without esophagitis: Secondary | ICD-10-CM

## 2021-07-15 DIAGNOSIS — E441 Mild protein-calorie malnutrition: Secondary | ICD-10-CM

## 2021-07-15 DIAGNOSIS — F321 Major depressive disorder, single episode, moderate: Secondary | ICD-10-CM

## 2021-07-15 DIAGNOSIS — I2510 Atherosclerotic heart disease of native coronary artery without angina pectoris: Secondary | ICD-10-CM | POA: Diagnosis not present

## 2021-07-15 DIAGNOSIS — D649 Anemia, unspecified: Secondary | ICD-10-CM

## 2021-07-15 DIAGNOSIS — R634 Abnormal weight loss: Secondary | ICD-10-CM

## 2021-07-15 LAB — CBC WITH DIFFERENTIAL/PLATELET

## 2021-07-15 LAB — LIPID PANEL

## 2021-07-15 MED ORDER — ESCITALOPRAM OXALATE 20 MG PO TABS: 20.0000 mg | ORAL_TABLET | Freq: Every day | ORAL | 1 refills | Status: AC

## 2021-07-15 MED ORDER — PANTOPRAZOLE SODIUM 40 MG PO TBEC: 40.0000 mg | DELAYED_RELEASE_TABLET | Freq: Every day | ORAL | 1 refills | Status: AC

## 2021-07-15 MED ORDER — SPIRIVA RESPIMAT 2.5 MCG/ACT IN AERS: 2.0000 | INHALATION_SPRAY | Freq: Every day | RESPIRATORY_TRACT | 2 refills | Status: DC

## 2021-07-15 MED ORDER — ATORVASTATIN CALCIUM 80 MG PO TABS: 80.0000 mg | ORAL_TABLET | Freq: Every evening | ORAL | 1 refills | Status: AC

## 2021-07-15 MED ORDER — ALBUTEROL SULFATE HFA 108 (90 BASE) MCG/ACT IN AERS: 2.0000 | INHALATION_SPRAY | Freq: Four times a day (QID) | RESPIRATORY_TRACT | 2 refills | Status: AC | PRN

## 2021-07-15 NOTE — Progress Notes (Signed)
Assessment & Plan:  1. Essential hypertension Resolved. No longer on medication to treat.  2. Diastolic dysfunction Well controlled on current regimen.  - CBC with Differential/Platelet - CMP14+EGFR - Lipid panel  3. Coronary artery disease involving native coronary artery of native heart without angina pectoris Continue aspirin and atorvastatin. - atorvastatin (LIPITOR) 80 MG tablet; Take 1 tablet (80 mg total) by mouth every evening.  Dispense: 90 tablet; Refill: 1 - CMP14+EGFR - Lipid panel  4. History of heart attack Continue aspirin and atorvastatin. - atorvastatin (LIPITOR) 80 MG tablet; Take 1 tablet (80 mg total) by mouth every evening.  Dispense: 90 tablet; Refill: 1 - CMP14+EGFR - Lipid panel  5. COPD without exacerbation ([place]) Encouraged to use Spiriva as prescribed and Albuterol as needed. Education provided on COPD. - albuterol (VENTOLIN HFA) 108 (90 Base) MCG/ACT inhaler; Inhale 2 puffs into the lungs every 6 (six) hours as needed for wheezing or shortness of breath.  Dispense: 18 g; Refill: 2 - Tiotropium Bromide Monohydrate (SPIRIVA RESPIMAT) 2.5 MCG/ACT AERS; Inhale 2 puffs into the lungs daily.  Dispense: 4 g; Refill: 2  6. Gastroesophageal reflux disease, unspecified whether esophagitis present Well controlled on current regimen.  - pantoprazole (PROTONIX) 40 MG tablet; Take 1 tablet (40 mg total) by mouth daily.  Dispense: 90 tablet; Refill: 1 - CMP14+EGFR  7-8. Unintentional weight loss/Malnutrition of mild degree (HCC) Encouraged to drink a Boost or Ensure if he is going to skip a meal. - CBC with Differential/Platelet - CMP14+EGFR  9. Depression, major, single episode, moderate ([place]) Well controlled on current regimen.  - escitalopram (LEXAPRO) 20 MG tablet; Take 1 tablet (20 mg total) by mouth daily.  Dispense: 90 tablet; Refill: 1 - CMP14+EGFR   Return in about 6 months (around 01/12/2022) for follow-up of chronic medication  conditions.  Hendricks Limes, MSN, APRN, FNP-C [facility]  Subjective:    Patient ID: Paul Snyder, male    DOB: 10/02/1935, 86 y.o.   MRN: [id number]  Patient Care Team: Loman Brooklyn, FNP as PCP - General (Family Medicine) [name] Alphonse Guild, MD as PCP - Cardiology (Cardiology) [name] Alphonse Guild, MD as Consulting Physician (Cardiology)   Chief Complaint:  Chief Complaint  Patient presents with   Medical Management of Chronic Issues    HPI: Paul Snyder is a 86 y.o. male presenting on 07/15/2021 for Medical Management of Chronic Issues  Hypertension: Most recently furosemide was changed to as needed due to soft blood pressures. Denies any chest pain or dizziness.  He does not check his blood pressure at home but does have a machine if he needs to.  Diastolic Dysfunction: Patient denies any swelling, shortness of breath or chest pain, even since furosemide was changed to as needed at our last visit. He does not weigh himself at home, but does have a scale if he needs to.  CAD/History of MI: taking aspirin and atorvastatin.  COPD: patient was prescribed Spiriva due to cough and wheezing multiple times per week, which he has not started using because he does not feel he needs it. He is also not using his Albuterol inhaler.   GERD: Controlled with Protonix.  Weight loss: patient is no longer drinking Boost daily. He is eating breakfast and dinner, skipping lunch.  Depression: doing well with Lexapro 20 mg daily.   Depression screen [facility] 2/9 07/15/2021 04/14/2021 02/26/2021  Decreased Interest 0 2 1  Down, Depressed, Hopeless 0 1 1  PHQ - 2  Score 0 3 2  Altered sleeping 0 2 1  Tired, decreased energy 0 2 1  Change in appetite 0 2 1  Feeling bad or failure about yourself  0 0 1  Trouble concentrating 0 0 1  Moving slowly or fidgety/restless 1 2 1   Suicidal thoughts 0 0 0  PHQ-9 Score 1 11 8   Difficult doing work/chores Not difficult at all Not  difficult at all Somewhat difficult  Some recent data might be hidden   New complaints: None   Social history:  Relevant past medical, surgical, family and social history reviewed and updated as indicated. Interim medical history since our last visit reviewed.  Allergies and medications reviewed and updated.  DATA REVIEWED: CHART IN EPIC  ROS: Negative unless specifically indicated above in HPI.    Current Outpatient Medications:    acetaminophen (TYLENOL) 325 MG tablet, Take 2 tablets (650 mg total) by mouth every 6 (six) hours as needed for mild pain (or Fever >/= 101)., Disp: 12 tablet, Rfl: 0   albuterol (VENTOLIN HFA) 108 (90 Base) MCG/ACT inhaler, Inhale 2 puffs into the lungs every 6 (six) hours as needed for wheezing or shortness of breath., Disp: 18 g, Rfl: 2   aspirin 81 MG EC tablet, Take 1 tablet (81 mg total) by mouth daily with breakfast., Disp: 30 tablet, Rfl: 12   atorvastatin (LIPITOR) 80 MG tablet, Take 1 tablet (80 mg total) by mouth every evening., Disp: 90 tablet, Rfl: 1   escitalopram (LEXAPRO) 20 MG tablet, Take 1 tablet by mouth once daily, Disp: 30 tablet, Rfl: 1   furosemide (LASIX) 20 MG tablet, Take 1 tablet (20 mg total) by mouth daily as needed (swelling or shortness of breath)., Disp: 90 tablet, Rfl: 1   niacin 500 MG tablet, Take 500 mg by mouth every morning. , Disp: , Rfl:    pantoprazole (PROTONIX) 40 MG tablet, Take 1 tablet (40 mg total) by mouth daily., Disp: 90 tablet, Rfl: 1   Tiotropium Bromide Monohydrate (SPIRIVA RESPIMAT) 2.5 MCG/ACT AERS, Inhale 2 puffs into the lungs daily., Disp: 4 g, Rfl: 2   No Known Allergies Past Medical History:  Diagnosis Date   CHF (congestive heart failure) (HCC)    COPD (chronic obstructive pulmonary disease) (HCC)    GERD (gastroesophageal reflux disease)    Lung cancer ([place]) 2009   NSTEMI (non-ST elevated myocardial infarction) ([place]) 07/16/2017   2/19 PCI/DES x1 to Lcx with moderate (50%) in the RCA, mild  (20%) in LAD    Past Surgical History:  Procedure Laterality Date   ANKLE FRACTURE SURGERY Right 1970s   CATARACT EXTRACTION Left    CORONARY ANGIOPLASTY WITH STENT PLACEMENT  07/19/2017   CORONARY STENT INTERVENTION N/A 07/19/2017   Procedure: CORONARY STENT INTERVENTION;  Surgeon: [name], MD;  Location: [place] [facility];  Service: Cardiovascular;  Laterality: N/A;   LEFT HEART CATH AND CORONARY ANGIOGRAPHY N/A 07/19/2017   Procedure: LEFT HEART CATH AND CORONARY ANGIOGRAPHY;  Surgeon: [name], MD;  Location: [place] [facility];  Service: Cardiovascular;  Laterality: N/A;   LUNG BIOPSY      Social History   Socioeconomic History   Marital status: Widowed    Spouse name: Not on file   Number of children: 2   Years of education: 7   Highest education level: 7th grade  Occupational History   Occupation: retired  Tobacco Use   Smoking status: Former    Packs/day: 1.00  Years: 42.00    Pack years: 42.00    Types: Cigarettes    Quit date: 37    Years since quitting: 27.1   Smokeless tobacco: Never  Vaping Use   Vaping Use: Never used  Substance and Sexual Activity   Alcohol use: No   Drug use: Not Currently   Sexual activity: Not Currently    Birth control/protection: None  Other Topics Concern   Not on file  Social History Narrative   Lives by himself, son lives next door, daughter 15 minutes away   Social Determinants of Health   Financial Resource Strain: Low Risk    Difficulty of Paying Living Expenses: Not hard at all  Food Insecurity: No Food Insecurity   Worried About Charity fundraiser in the Last Year: Never true   Arboriculturist in the Last Year: Never true  Transportation Needs: No Transportation Needs   Lack of Transportation (Medical): No   Lack of Transportation (Non-Medical): No  Physical Activity: Insufficiently Active   Days of Exercise per Week: 7 days   Minutes of Exercise per Session: 10 min  Stress: No Stress Concern  Present   Feeling of Stress : Not at all  Social Connections: Moderately Integrated   Frequency of Communication with Friends and Family: More than three times a week   Frequency of Social Gatherings with Friends and Family: More than three times a week   Attends Religious Services: More than 4 times per year   Active Member of [employer] or Organizations: Yes   Attends Archivist Meetings: More than 4 times per year   Marital Status: Widowed  Human resources officer Violence: Not At Risk   Fear of Current or Ex-Partner: No   Emotionally Abused: No   Physically Abused: No   Sexually Abused: No        Objective:    BP (!) 105/56    Pulse (!) 48    Temp (!) 96.9 F (36.1 C) (Temporal)    Ht 5' 11"  (1.803 m)    Wt 148 lb 3.2 oz (67.2 kg)    SpO2 95%    BMI 20.67 kg/m   Wt Readings from Last 3 Encounters:  07/15/21 148 lb 3.2 oz (67.2 kg)  02/26/21 153 lb (69.4 kg)  01/15/21 156 lb (70.8 kg)    Physical Exam Vitals reviewed.  Constitutional:      General: He is not in acute distress.    Appearance: Normal appearance. He is normal weight. He is not ill-appearing, toxic-appearing or diaphoretic.  HENT:     Head: Normocephalic and atraumatic.  Eyes:     General: No scleral icterus.       Right eye: No discharge.        Left eye: No discharge.     Conjunctiva/sclera: Conjunctivae normal.  Cardiovascular:     Rate and Rhythm: Normal rate and regular rhythm.     Heart sounds: Normal heart sounds. No murmur heard.   No friction rub. No gallop.  Pulmonary:     Effort: Pulmonary effort is normal. No respiratory distress.     Breath sounds: No stridor. No wheezing, rhonchi or rales.  Musculoskeletal:        General: Normal range of motion.     Cervical back: Normal range of motion.  Skin:    General: Skin is warm and dry.  Neurological:     Mental Status: He is alert and oriented to person, place, and time.  Mental status is at baseline.     Gait: Gait abnormal (riding in [facility]).   Psychiatric:        Mood and Affect: Mood normal.        Behavior: Behavior normal.        Thought Content: Thought content normal.        Judgment: Judgment normal.    No results found for: TSH Lab Results  Component Value Date   WBC 4.4 12/09/2020   HGB 12.8 (L) 12/09/2020   HCT 39.1 12/09/2020   MCV 86 12/09/2020   PLT 144 (L) 12/09/2020   Lab Results  Component Value Date   NA 141 12/09/2020   K 4.4 12/09/2020   CO2 27 12/09/2020   GLUCOSE 91 12/09/2020   BUN 11 12/09/2020   CREATININE 0.98 12/09/2020   BILITOT 0.5 12/09/2020   ALKPHOS 66 12/09/2020   AST 12 12/09/2020   ALT 7 12/09/2020   PROT 6.0 12/09/2020   ALBUMIN 3.8 12/09/2020   CALCIUM 9.6 12/09/2020   ANIONGAP 10 05/20/2019   EGFR 77 12/09/2020   Lab Results  Component Value Date   CHOL 105 12/09/2020   Lab Results  Component Value Date   HDL 46 12/09/2020   Lab Results  Component Value Date   LDLCALC 48 12/09/2020   Lab Results  Component Value Date   TRIG 40 12/09/2020   Lab Results  Component Value Date   CHOLHDL 2.3 12/09/2020   Lab Results  Component Value Date   HGBA1C 4.8 05/16/2019

## 2021-07-16 LAB — CBC WITH DIFFERENTIAL/PLATELET
Basophils Absolute: 0 10*3/uL (ref 0.0–0.2)
Basos: 1 %
EOS (ABSOLUTE): 0.1 10*3/uL (ref 0.0–0.4)
Eos: 2 %
Hematocrit: 37 % — ABNORMAL LOW (ref 37.5–51.0)
Hemoglobin: 12 g/dL — ABNORMAL LOW (ref 13.0–17.7)
Immature Grans (Abs): 0 10*3/uL (ref 0.0–0.1)
Immature Granulocytes: 0 %
Lymphocytes Absolute: 1 10*3/uL (ref 0.7–3.1)
Lymphs: 17 %
MCH: 29.2 pg (ref 26.6–33.0)
MCHC: 32.4 g/dL (ref 31.5–35.7)
MCV: 90 fL (ref 79–97)
Monocytes Absolute: 0.6 10*3/uL (ref 0.1–0.9)
Monocytes: 9 %
Neutrophils Absolute: 4.4 10*3/uL (ref 1.4–7.0)
Neutrophils: 71 %
Platelets: 132 10*3/uL — ABNORMAL LOW (ref 150–450)
RBC: 4.11 x10E6/uL — ABNORMAL LOW (ref 4.14–5.80)
RDW: 12.6 % (ref 11.6–15.4)
WBC: 6.2 10*3/uL (ref 3.4–10.8)

## 2021-07-16 LAB — CMP14+EGFR
ALT: 13 IU/L (ref 0–44)
AST: 16 IU/L (ref 0–40)
Albumin/Globulin Ratio: 1.8 (ref 1.2–2.2)
Albumin: 3.7 g/dL (ref 3.6–4.6)
Alkaline Phosphatase: 69 IU/L (ref 44–121)
BUN/Creatinine Ratio: 25 — ABNORMAL HIGH (ref 10–24)
BUN: 23 mg/dL (ref 8–27)
Bilirubin Total: 0.6 mg/dL (ref 0.0–1.2)
CO2: 25 mmol/L (ref 20–29)
Calcium: 9.5 mg/dL (ref 8.6–10.2)
Chloride: 105 mmol/L (ref 96–106)
Creatinine, Ser: 0.91 mg/dL (ref 0.76–1.27)
Globulin, Total: 2.1 g/dL (ref 1.5–4.5)
Glucose: 101 mg/dL — ABNORMAL HIGH (ref 70–99)
Potassium: 4.6 mmol/L (ref 3.5–5.2)
Sodium: 146 mmol/L — ABNORMAL HIGH (ref 134–144)
Total Protein: 5.8 g/dL — ABNORMAL LOW (ref 6.0–8.5)
eGFR: 83 mL/min/{1.73_m2} (ref >59)

## 2021-07-16 LAB — LIPID PANEL
Chol/HDL Ratio: 2.4 ratio (ref 0.0–5.0)
Cholesterol, Total: 119 mg/dL (ref 100–199)
HDL: 49 mg/dL (ref >39)
LDL Chol Calc (NIH): 60 mg/dL (ref 0–99)
Triglycerides: 39 mg/dL (ref 0–149)
VLDL Cholesterol Cal: 10 mg/dL (ref 5–40)

## 2021-07-16 NOTE — Telephone Encounter (Signed)
Let Paul Snyder know that fax came yesterday from pharmacy that pt's insurance wanted to changed Spiriva to Incruse or a PA needed to be done, sent this request to our PA pool. The Escitalopram was sent to pharmacy yesterday for a 90d supply with a refill. Last RF was for a 30d supply on 06/25/21 per the dispense report, instructed her at next refill to ask for the 90d supply.

## 2021-07-17 ENCOUNTER — Telehealth: Payer: Self-pay

## 2021-07-17 DIAGNOSIS — J449 Chronic obstructive pulmonary disease, unspecified: Secondary | ICD-10-CM

## 2021-07-17 NOTE — Telephone Encounter (Signed)
[facility] ([facility]) Rx #: [id number] Spiriva Respimat 2.5MCG/ACT aerosol   Form [facility] Medicare Electronic Prior Authorization Request Form [phone] NCPDP)

## 2021-07-18 ENCOUNTER — Encounter: Payer: Self-pay | Admitting: Family Medicine

## 2021-07-18 DIAGNOSIS — I5189 Other ill-defined heart diseases: Secondary | ICD-10-CM | POA: Insufficient documentation

## 2021-07-19 MED ORDER — UMECLIDINIUM BROMIDE 62.5 MCG/ACT IN AEPB: 1.0000 | INHALATION_SPRAY | Freq: Every day | RESPIRATORY_TRACT | 2 refills | Status: DC

## 2021-07-19 NOTE — Telephone Encounter (Signed)
D/C'd Spiriva. Rx'd Incruse. Please make patient/family aware of the change.

## 2021-07-19 NOTE — Telephone Encounter (Signed)
Spiriva Respimat 2.5 mcg/act PA has been denied. Please review  Message from Plan  Denied.  This drug is not covered on the formulary. We are denying your request because we do not show that you have tried at least 2 covered drugs that can treat your condition. Other covered drug(s) is/are: ATROVENT HFA, Anoro Ellipta inhalation aerosol powder breath activated 62.5-25 mcg/inh, Breo Ellipta inhalation aerosol powder breath activated 100-25, 200-25 mcg/inh, Incruse Ellipta inhalation aerosol powder breath activated 62.5 mcg/inh. We may be able to make an exception to cover this drug. Your doctor will need to send [place] medical records showing that you tried this drug. If you cannot take the covered drug, your doctor will need to tell us why. Note: Some covered drug(s) may have quantity limits. Please refer to the formulary for details.

## 2021-07-20 NOTE — Telephone Encounter (Signed)
Please see previous note.

## 2021-07-20 NOTE — Telephone Encounter (Signed)
Lmtcb.

## 2021-07-22 ENCOUNTER — Telehealth: Payer: Self-pay | Admitting: Oncology

## 2021-07-22 NOTE — Telephone Encounter (Signed)
Contacted patient son and asked if they would like to be seen at [place] since its closer to where they live and he advised that yes that would be okay. Sending over referral to [place]. ?

## 2021-07-23 ENCOUNTER — Other Ambulatory Visit: Payer: Self-pay | Admitting: Family

## 2021-07-23 DIAGNOSIS — D649 Anemia, unspecified: Secondary | ICD-10-CM

## 2021-07-26 ENCOUNTER — Inpatient Hospital Stay (HOSPITAL_BASED_OUTPATIENT_CLINIC_OR_DEPARTMENT_OTHER): Payer: Medicare Other | Admitting: Family

## 2021-07-26 ENCOUNTER — Encounter: Payer: Self-pay | Admitting: Family

## 2021-07-26 ENCOUNTER — Other Ambulatory Visit: Payer: Self-pay

## 2021-07-26 ENCOUNTER — Inpatient Hospital Stay: Payer: Medicare Other | Attending: Hematology & Oncology

## 2021-07-26 VITALS — BP 113/46 | HR 49 | Temp 98.1°F | Resp 18 | Ht 69.0 in | Wt 149.4 lb

## 2021-07-26 DIAGNOSIS — Z7982 Long term (current) use of aspirin: Secondary | ICD-10-CM | POA: Diagnosis not present

## 2021-07-26 DIAGNOSIS — D649 Anemia, unspecified: Secondary | ICD-10-CM | POA: Insufficient documentation

## 2021-07-26 DIAGNOSIS — I252 Old myocardial infarction: Secondary | ICD-10-CM | POA: Insufficient documentation

## 2021-07-26 DIAGNOSIS — Z85118 Personal history of other malignant neoplasm of bronchus and lung: Secondary | ICD-10-CM | POA: Insufficient documentation

## 2021-07-26 DIAGNOSIS — D696 Thrombocytopenia, unspecified: Secondary | ICD-10-CM | POA: Insufficient documentation

## 2021-07-26 DIAGNOSIS — R42 Dizziness and giddiness: Secondary | ICD-10-CM | POA: Diagnosis not present

## 2021-07-26 DIAGNOSIS — K219 Gastro-esophageal reflux disease without esophagitis: Secondary | ICD-10-CM | POA: Insufficient documentation

## 2021-07-26 DIAGNOSIS — I509 Heart failure, unspecified: Secondary | ICD-10-CM | POA: Diagnosis not present

## 2021-07-26 DIAGNOSIS — L03116 Cellulitis of left lower limb: Secondary | ICD-10-CM | POA: Insufficient documentation

## 2021-07-26 DIAGNOSIS — Z79899 Other long term (current) drug therapy: Secondary | ICD-10-CM | POA: Diagnosis not present

## 2021-07-26 DIAGNOSIS — Z87891 Personal history of nicotine dependence: Secondary | ICD-10-CM | POA: Insufficient documentation

## 2021-07-26 DIAGNOSIS — J449 Chronic obstructive pulmonary disease, unspecified: Secondary | ICD-10-CM | POA: Insufficient documentation

## 2021-07-26 LAB — CMP (CANCER CENTER ONLY)
ALT: 18 U/L (ref 0–44)
AST: 16 U/L (ref 15–41)
Albumin: 3.6 g/dL (ref 3.5–5.0)
Alkaline Phosphatase: 58 U/L (ref 38–126)
Anion gap: 4 — ABNORMAL LOW (ref 5–15)
BUN: 36 mg/dL — ABNORMAL HIGH (ref 8–23)
CO2: 30 mmol/L (ref 22–32)
Calcium: 9 mg/dL (ref 8.9–10.3)
Chloride: 106 mmol/L (ref 98–111)
Creatinine: 1.06 mg/dL (ref 0.61–1.24)
GFR, Estimated: >60 mL/min (ref >60)
Glucose, Bld: 94 mg/dL (ref 70–99)
Potassium: 4.1 mmol/L (ref 3.5–5.1)
Sodium: 140 mmol/L (ref 135–145)
Total Bilirubin: 0.7 mg/dL (ref 0.3–1.2)
Total Protein: 5.7 g/dL — ABNORMAL LOW (ref 6.5–8.1)

## 2021-07-26 LAB — RETICULOCYTES
Immature Retic Fract: 3.8 % (ref 2.3–15.9)
RBC.: 3.86 MIL/uL — ABNORMAL LOW (ref 4.22–5.81)
Retic Count, Absolute: 30.5 10*3/uL (ref 19.0–186.0)
Retic Ct Pct: 0.8 % (ref 0.4–3.1)

## 2021-07-26 LAB — CBC WITH DIFFERENTIAL (CANCER CENTER ONLY)
Abs Immature Granulocytes: 0.01 10*3/uL (ref 0.00–0.07)
Basophils Absolute: 0 10*3/uL (ref 0.0–0.1)
Basophils Relative: 0 %
Eosinophils Absolute: 0.1 10*3/uL (ref 0.0–0.5)
Eosinophils Relative: 1 %
HCT: 35.8 % — ABNORMAL LOW (ref 39.0–52.0)
Hemoglobin: 11.6 g/dL — ABNORMAL LOW (ref 13.0–17.0)
Immature Granulocytes: 0 %
Lymphocytes Relative: 20 %
Lymphs Abs: 1 10*3/uL (ref 0.7–4.0)
MCH: 29.8 pg (ref 26.0–34.0)
MCHC: 32.4 g/dL (ref 30.0–36.0)
MCV: 92 fL (ref 80.0–100.0)
Monocytes Absolute: 0.6 10*3/uL (ref 0.1–1.0)
Monocytes Relative: 13 %
Neutro Abs: 3.1 10*3/uL (ref 1.7–7.7)
Neutrophils Relative %: 66 %
Platelet Count: 99 10*3/uL — ABNORMAL LOW (ref 150–400)
RBC: 3.89 MIL/uL — ABNORMAL LOW (ref 4.22–5.81)
RDW: 14.1 % (ref 11.5–15.5)
WBC Count: 4.8 10*3/uL (ref 4.0–10.5)
nRBC: 0 % (ref 0.0–0.2)

## 2021-07-26 LAB — IRON AND IRON BINDING CAPACITY (CC-WL,HP ONLY)
Iron: 121 ug/dL (ref 45–182)
Saturation Ratios: 52 % — ABNORMAL HIGH (ref 17.9–39.5)
TIBC: 234 ug/dL — ABNORMAL LOW (ref 250–450)
UIBC: 113 ug/dL — ABNORMAL LOW (ref 117–376)

## 2021-07-26 LAB — LACTATE DEHYDROGENASE: LDH: 87 U/L — ABNORMAL LOW (ref 98–192)

## 2021-07-26 LAB — SAVE SMEAR(SSMR), FOR PROVIDER SLIDE REVIEW

## 2021-07-26 LAB — FERRITIN: Ferritin: 48 ng/mL (ref 24–336)
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
## 2021-07-26 NOTE — Telephone Encounter (Signed)
Left detailed message.   

## 2021-07-27 LAB — ERYTHROPOIETIN: Erythropoietin: 6.5 m[IU]/mL (ref 2.6–18.5)

## 2021-08-24 ENCOUNTER — Ambulatory Visit (INDEPENDENT_AMBULATORY_CARE_PROVIDER_SITE_OTHER): Payer: Medicare Other | Admitting: Nurse Practitioner

## 2021-08-24 ENCOUNTER — Encounter: Payer: Self-pay | Admitting: Nurse Practitioner

## 2021-08-24 VITALS — BP 98/48 | HR 58 | Temp 98.1°F | Ht 69.0 in | Wt 148.0 lb

## 2021-08-24 DIAGNOSIS — J029 Acute pharyngitis, unspecified: Secondary | ICD-10-CM | POA: Diagnosis not present

## 2021-08-24 LAB — CULTURE, GROUP A STREP

## 2021-08-24 LAB — RAPID STREP SCREEN (MED CTR MEBANE ONLY): Strep Gp A Ag, IA W/Reflex: NEGATIVE

## 2021-08-24 NOTE — Patient Instructions (Signed)
Strep Throat, Adult ?Strep throat is an infection of the throat. It is caused by germs (bacteria). Strep throat is common during the cold months of the year. It mostly affects children who are 6-84 years old. However, people of all ages can get it at any time of the year. This infection spreads from person to person through coughing, sneezing, or having close contact. ?What are the causes? ?This condition is caused by the Streptococcus pyogenes germ. ?What increases the risk? ?You care for young children. Children are more likely to get strep throat and may spread it to others. ?You go to crowded places. Germs can spread easily in such places. ?You kiss or touch someone who has strep throat. ?What are the signs or symptoms? ?Fever or chills. ?Redness, swelling, or pain in the tonsils or throat. ?Pain or trouble when swallowing. ?White or yellow spots on the tonsils or throat. ?Tender glands in the neck and under the jaw. ?Bad breath. ?Red rash all over the body. This is rare. ?How is this treated? ?Medicines that kill germs (antibiotics). ?Medicines that treat pain or fever. These include: ?Ibuprofen or acetaminophen. ?Aspirin, only for people who are over the age of 54. ?Cough drops. ?Throat sprays. ?Follow these instructions at home: ?Medicines ? ?Take over-the-counter and prescription medicines only as told by your doctor. ?Take your antibiotic medicine as told by your doctor. Do not stop taking the antibiotic even if you start to feel better. ?Eating and drinking ? ?If you have trouble swallowing, eat soft foods until your throat feels better. ?Drink enough fluid to keep your pee (urine) pale yellow. ?To help with pain, you may have: ?Warm fluids, such as soup and tea. ?Cold fluids, such as frozen desserts or popsicles. ?General instructions ?Rinse your mouth (gargle) with a salt-water mixture 3-4 times a day or as needed. To make a salt-water mixture, dissolve ?-1 tsp (3-6 g) of salt in 1 cup (237 mL) of warm  water. ?Rest as much as you can. ?Stay home from work or school until you have been taking antibiotics for 24 hours. ?Do not smoke or use any products that contain nicotine or tobacco. If you need help quitting, ask your doctor. ?Keep all follow-up visits. ?How is this prevented? ? ?Do not share food, drinking cups, or personal items. They can cause the germs to spread. ?Wash your hands well with soap and water. Make sure that all people in your house wash their hands well. ?Have family members tested if they have a fever or a sore throat. They may need an antibiotic if they have strep throat. ?Contact a doctor if: ?You have swelling in your neck that keeps getting bigger. ?You get a rash, cough, or earache. ?You cough up a thick fluid that is green, yellow-brown, or bloody. ?You have pain that does not get better with medicine. ?Your symptoms get worse instead of getting better. ?You have a fever. ?Get help right away if: ?You vomit. ?You have a very bad headache. ?Your neck hurts or feels stiff. ?You have chest pain or are short of breath. ?You have drooling, very bad throat pain, or changes in your voice. ?Your neck is swollen, or the skin gets red and tender. ?Your mouth is dry, or you are peeing less than normal. ?You keep feeling more tired or have trouble waking up. ?Your joints are red or painful. ?These symptoms may be an emergency. Do not wait to see if the symptoms will go away. Get help right away. Call  your local emergency services (911 in the [place] ?Summary ?Strep throat is an infection of the throat. It is caused by germs (bacteria). ?This infection can spread from person to person through coughing, sneezing, or having close contact. ?Take your medicines, including antibiotics, as told by your doctor. Do not stop taking the antibiotic even if you start to feel better. ?To prevent the spread of germs, wash your hands well with soap and water. Have others do the same. Do not share food, drinking cups,  or personal items. ?Get help right away if you have a bad headache, chest pain, shortness of breath, a stiff or painful neck, or you vomit. ?This information is not intended to replace advice given to you by your health care provider. Make sure you discuss any questions you have with your health care provider. ?Document Revised: 09/01/2020 Document Reviewed: 09/01/2020 ?Elsevier Patient Education ? [place]. ? ?

## 2021-08-24 NOTE — Progress Notes (Signed)
? ?Acute Office Visit ? ?Subjective:  ? ? Patient ID: Paul Snyder, male    DOB: [birth year], 86 y.o.   MRN: [id number] ? ?Chief Complaint  ?Patient presents with  ? Sore Throat  ? ? ?Sore Throat  ?This is a new problem. The current episode started yesterday. The problem has been unchanged. The pain is worse on the right side. There has been no fever. The pain is mild. Associated symptoms include trouble swallowing. Pertinent negatives include no abdominal pain, congestion, coughing, drooling, ear discharge or ear pain. He has had no exposure to strep. He has tried nothing for the symptoms.  ? ? ? ?Past Medical History:  ?Diagnosis Date  ? CHF (congestive heart failure) ([place])   ? COPD (chronic obstructive pulmonary disease) ([facility])   ? GERD (gastroesophageal reflux disease)   ? Lung cancer [facility]) 2009  ? NSTEMI (non-ST elevated myocardial infarction) ([facility]) 07/16/2017  ? 2/19 PCI/DES x1 to Lcx with moderate (50%) in the RCA, mild (20%) in LAD  ? ? ?Past Surgical History:  ?Procedure Laterality Date  ? ANKLE FRACTURE SURGERY Right 1970s  ? CATARACT EXTRACTION Left   ? CORONARY ANGIOPLASTY WITH STENT PLACEMENT  07/19/2017  ? CORONARY STENT INTERVENTION N/A 07/19/2017  ? Procedure: CORONARY STENT INTERVENTION;  Surgeon: [name], MD;  Location: [facility];  Service: Cardiovascular;  Laterality: N/A;  ? LEFT HEART CATH AND CORONARY ANGIOGRAPHY N/A 07/19/2017  ? Procedure: LEFT HEART CATH AND CORONARY ANGIOGRAPHY;  Surgeon: [name], MD;  Location: [facility];  Service: Cardiovascular;  Laterality: N/A;  ? LUNG BIOPSY    ? ? ?Family History  ?Problem Relation Age of Onset  ? Healthy Mother   ? Breast cancer Sister   ? Heart disease Brother   ? Heart failure Brother   ? Breast cancer Sister   ? Hypertension Son   ? Hyperlipidemia Son   ? ? ?Social History  ? ?Socioeconomic History  ? Marital status: Widowed  ?  Spouse name: Not on file  ? Number of children: 2  ? Years of education: 7  ? Highest  education level: 7th grade  ?Occupational History  ? Occupation: retired  ?Tobacco Use  ? Smoking status: Former  ?  Packs/day: 1.00  ?  Years: 42.00  ?  Pack years: 42.00  ?  Types: Cigarettes  ?  Quit date: 63  ?  Years since quitting: 27.2  ? Smokeless tobacco: Never  ? Tobacco comments:  ?  Quit 07/27/1994  ?Vaping Use  ? Vaping Use: Never used  ?Substance and Sexual Activity  ? Alcohol use: No  ? Drug use: Not Currently  ? Sexual activity: Not Currently  ?  Birth control/protection: None  ?Other Topics Concern  ? Not on file  ?Social History Narrative  ? Lives by himself, son lives next door, daughter 15 minutes away  ? ?Social Determinants of Health  ? ?Financial Resource Strain: Low Risk   ? Difficulty of Paying Living Expenses: Not hard at all  ?Food Insecurity: No Food Insecurity  ? Worried About Charity fundraiser in the Last Year: Never true  ? Ran Out of Food in the Last Year: Never true  ?Transportation Needs: No Transportation Needs  ? Lack of Transportation (Medical): No  ? Lack of Transportation (Non-Medical): No  ?Physical Activity: Insufficiently Active  ? Days of Exercise per Week: 7 days  ? Minutes of Exercise per Session: 10 min  ?Stress:  No Stress Concern Present  ? Feeling of Stress : Not at all  ?Social Connections: Moderately Integrated  ? Frequency of Communication with Friends and Family: More than three times a week  ? Frequency of Social Gatherings with Friends and Family: More than three times a week  ? Attends Religious Services: More than 4 times per year  ? Active Member of Clubs or Organizations: Yes  ? Attends Archivist Meetings: More than 4 times per year  ? Marital Status: Widowed  ?Intimate Partner Violence: Not At Risk  ? Fear of Current or Ex-Partner: No  ? Emotionally Abused: No  ? Physically Abused: No  ? Sexually Abused: No  ? ? ?Outpatient Medications Prior to Visit  ?Medication Sig Dispense Refill  ? acetaminophen (TYLENOL) 325 MG tablet Take 2 tablets (650 mg  total) by mouth every 6 (six) hours as needed for mild pain (or Fever >/= 101). 12 tablet 0  ? albuterol (VENTOLIN HFA) 108 (90 Base) MCG/ACT inhaler Inhale 2 puffs into the lungs every 6 (six) hours as needed for wheezing or shortness of breath. 18 g 2  ? aspirin 81 MG EC tablet Take 1 tablet (81 mg total) by mouth daily with breakfast. 30 tablet 12  ? atorvastatin (LIPITOR) 80 MG tablet Take 1 tablet (80 mg total) by mouth every evening. 90 tablet 1  ? escitalopram (LEXAPRO) 20 MG tablet Take 1 tablet (20 mg total) by mouth daily. 90 tablet 1  ? furosemide (LASIX) 20 MG tablet Take 1 tablet (20 mg total) by mouth daily as needed (swelling or shortness of breath). 90 tablet 1  ? niacin 500 MG tablet Take 500 mg by mouth every morning.     ? pantoprazole (PROTONIX) 40 MG tablet Take 1 tablet (40 mg total) by mouth daily. 90 tablet 1  ? umeclidinium bromide (INCRUSE ELLIPTA) 62.5 MCG/ACT AEPB Inhale 1 puff into the lungs daily. 30 each 2  ? ?No facility-administered medications prior to visit.  ? ? ?No Known Allergies ? ?Review of Systems  ?Constitutional:  Negative for appetite change, fatigue and fever.  ?HENT:  Positive for trouble swallowing. Negative for congestion, drooling, ear discharge and ear pain.   ?Eyes: Negative.   ?Respiratory: Negative.  Negative for cough.   ?Gastrointestinal: Negative.  Negative for abdominal pain.  ?Genitourinary: Negative.   ?All other systems reviewed and are negative. ? ?   ?Objective:  ?  ?Physical Exam ?Vitals and nursing note reviewed. Exam conducted with a chaperone present.  ?Constitutional:   ?   Appearance: Normal appearance. He is normal weight.  ?HENT:  ?   Head: Normocephalic.  ?   Right Ear: External ear normal.  ?   Left Ear: External ear normal.  ?   Mouth/Throat:  ?   Lips: Pink.  ?   Mouth: Mucous membranes are moist.  ?   Tongue: No lesions. Tongue does not deviate from midline.  ?   Pharynx: Oropharyngeal exudate present.  ? ?   Comments: Pain and  tenderness ? ?Eyes:  ?   Conjunctiva/sclera: Conjunctivae normal.  ?Cardiovascular:  ?   Rate and Rhythm: Normal rate and regular rhythm.  ?   Pulses: Normal pulses.  ?   Heart sounds: Normal heart sounds.  ?Pulmonary:  ?   Effort: Pulmonary effort is normal.  ?   Breath sounds: Normal breath sounds.  ?Abdominal:  ?   General: Bowel sounds are normal.  ?Skin: ?   General: Skin is  warm.  ?   Findings: No rash.  ?Neurological:  ?   General: No focal deficit present.  ?   Mental Status: He is alert and oriented to person, place, and time.  ?Psychiatric:     ?   Behavior: Behavior normal.  ? ? ?BP (!) 98/48   Pulse (!) 58   Temp 98.1 ?F (36.7 ?C)   Ht 5' 9"  (1.753 m)   Wt 148 lb (67.1 kg)   SpO2 95%   BMI 21.86 kg/m?  ?Wt Readings from Last 3 Encounters:  ?08/24/21 148 lb (67.1 kg)  ?07/26/21 149 lb 6.4 oz (67.8 kg)  ?07/15/21 148 lb 3.2 oz (67.2 kg)  ? ? ?Health Maintenance Due  ?Topic Date Due  ? COVID-19 Vaccine (4 - Booster for Moderna series) 05/23/2020  ? ? ?There are no preventive care reminders to display for this patient. ? ? ?No results found for: TSH ?Lab Results  ?Component Value Date  ? WBC 4.8 07/26/2021  ? HGB 11.6 (L) 07/26/2021  ? HCT 35.8 (L) 07/26/2021  ? MCV 92.0 07/26/2021  ? PLT 99 (L) 07/26/2021  ? ?Lab Results  ?Component Value Date  ? NA 140 07/26/2021  ? K 4.1 07/26/2021  ? CO2 30 07/26/2021  ? GLUCOSE 94 07/26/2021  ? BUN 36 (H) 07/26/2021  ? CREATININE 1.06 07/26/2021  ? BILITOT 0.7 07/26/2021  ? ALKPHOS 58 07/26/2021  ? AST 16 07/26/2021  ? ALT 18 07/26/2021  ? PROT 5.7 (L) 07/26/2021  ? ALBUMIN 3.6 07/26/2021  ? CALCIUM 9.0 07/26/2021  ? ANIONGAP 4 (L) 07/26/2021  ? EGFR 83 07/15/2021  ? ?Lab Results  ?Component Value Date  ? CHOL 119 07/15/2021  ? ?Lab Results  ?Component Value Date  ? HDL 49 07/15/2021  ? ?Lab Results  ?Component Value Date  ? [place] 60 07/15/2021  ? ?Lab Results  ?Component Value Date  ? TRIG 39 07/15/2021  ? ?Lab Results  ?Component Value Date  ? CHOLHDL 2.4  07/15/2021  ? ?Lab Results  ?Component Value Date  ? HGBA1C 4.8 05/16/2019  ? ? ?   ?Assessment & Plan:  ?Take meds as prescribed ?- salt water gargle ?- increase hydration  ?-For fever or aches or pains- take Tylenol or ibupr

## 2021-09-27 ENCOUNTER — Encounter: Payer: Self-pay | Admitting: Family Medicine

## 2021-09-27 ENCOUNTER — Ambulatory Visit (INDEPENDENT_AMBULATORY_CARE_PROVIDER_SITE_OTHER): Payer: Medicare Other | Admitting: Family Medicine

## 2021-09-27 VITALS — BP 97/54 | HR 46 | Temp 98.1°F | Ht 69.0 in | Wt 149.4 lb

## 2021-09-27 DIAGNOSIS — K0889 Other specified disorders of teeth and supporting structures: Secondary | ICD-10-CM

## 2021-09-27 MED ORDER — AMOXICILLIN-POT CLAVULANATE 875-125 MG PO TABS
1.0000 | ORAL_TABLET | Freq: Two times a day (BID) | ORAL | 0 refills | Status: AC
Start: 2021-09-27 — End: 2021-10-04

## 2021-09-27 NOTE — Progress Notes (Signed)
? ?  Acute Office Visit ? ?Subjective:  ? ?  ?Patient ID: Paul Snyder, male    DOB: 1936-02-21, 86 y.o.   MRN: [id number] ? ?Chief Complaint  ?Patient presents with  ? Jaw Pain  ? ? ?HPI ?Here with daughter today. Patient is reports pain in her lower left jaw that radiates to his ear for the last week. It has been worse for the last 2 days. He does have pain with chewing on his side as well. He denies fever or chills. He is established with a dentist.  ? ?ROS ?As per HPI.  ? ?   ?Objective:  ?  ?BP (!) 97/54   Pulse (!) 46   Temp 98.1 ?F (36.7 ?C) (Temporal)   Ht 5\' 9"  (1.753 m)   Wt 149 lb 6 oz (67.8 kg)   SpO2 97%   BMI 22.06 kg/m?  ? ? ?Physical Exam ?Vitals and nursing note reviewed.  ?Constitutional:   ?   General: He is not in acute distress. ?   Appearance: He is not ill-appearing, toxic-appearing or diaphoretic.  ?HENT:  ?   Head:  ?   Jaw: No swelling, pain on movement or malocclusion.  ?   Right Ear: Tympanic membrane, ear canal and external ear normal.  ?   Left Ear: Tympanic membrane, ear canal and external ear normal.  ?   Mouth/Throat:  ?   Dentition: Abnormal dentition. Dental tenderness and dental caries present. No dental abscesses or gum lesions.  ? ?   Comments: Edentulous. Tenderness at base of tooth on lower left side. Obvious decay present. Tenderness to cheek at this area as well.  ?Pulmonary:  ?   Effort: Pulmonary effort is normal. No respiratory distress.  ?Skin: ?   General: Skin is warm and dry.  ?Neurological:  ?   Mental Status: He is alert and oriented to person, place, and time.  ?   Gait: Gait abnormal (arrives in wheelchair).  ?Psychiatric:     ?   Mood and Affect: Mood normal.     ?   Behavior: Behavior normal.  ? ? ?No results found for any visits on 09/27/21. ? ? ?   ?Assessment & Plan:  ? ?Pasqual was seen today for jaw pain. ? ?Diagnoses and all orders for this visit: ? ?Tooth pain ?Augmentin ordered as below. Instructed to call his dentist to schedule an appointment as soon  as possible.  ?-     amoxicillin-clavulanate (AUGMENTIN) 875-125 MG tablet; Take 1 tablet by mouth 2 (two) times daily for 7 days. ? ?Return if symptoms worsen or fail to improve. ? ?The patient indicates understanding of these issues and agrees with the plan. ? ?Gwenlyn Perking, FNP ? ? ?

## 2021-09-27 NOTE — Patient Instructions (Signed)
Dental Pain ?Dental pain is often a sign that something is wrong with your teeth or gums. It is also something that can occur following dental treatment. If you have dental pain, it is important to contact your dental care provider, especially if the cause of the pain has not been determined. Dental pain may be of varying intensity and can be caused by many things, including: ?Tooth decay (cavities or caries). Cavities are caused by bacteria that produce acids that irritate the nerve of your tooth, making it sensitive to air and hot or cold temperatures. This eventually causes discomfort or pain. ?Abscess or infection. Once the bacteria reach the inner part of the tooth (pulp), a bacterial infection (dental abscess) can occur. Pus typically collects at the end of the root of a tooth. ?Injury. ?A crack in the tooth. ?Gum recession exposing the root, and possibly the nerves, of a tooth. ?Gum (periodontal)disease. ?Abnormal grinding or clenching. ?Poor or improper home care. ?An unknown reason (idiopathic). ?Your pain may be mild or severe. It may occur when you are: ?Chewing. ?Exposed to hot or cold temperatures. ?Eating or drinking sugary foods or beverages, such as soda or candy. ?Your pain may be constant, or it may come and go without cause. ?Follow these instructions at home: ?The following actions may help to lessen any discomfort that you are feeling before or after getting dental care. ?Medicines ?Take over-the-counter and prescription medicines only as told by your dental care provider. ?If you were prescribed an antibiotic medicine, take it as told by your dental care provider. Do not stop taking the antibiotic even if you start to feel better. ?Eating and drinking ?Avoid foods or drinks that cause you pain, such as: ?Very hot or very cold foods or drinks. ?Sweet or sugary foods or drinks. ?Managing pain and swelling ? ?Ice can sometimes be used to reduce pain and swelling, especially if the pain is  following dental treatment. ?If directed, put ice on the painful area of your face. To do this: ?Put ice in a plastic bag. ?Place a towel between your skin and the bag. ?Leave the ice on for 20 minutes, 2-3 times a day. ?Remove the ice if your skin turns bright red. This is very important. If you cannot feel pain, heat, or cold, you have a greater risk of damage to the area. ?Brushing your teeth ?To keep your mouth and gums healthy, brush your teeth twice a day using a fluoride toothpaste. ?Use a toothpaste made for sensitive teeth as directed by your dental care provider, especially if the root is exposed. ?Always brush your teeth with a soft-bristled toothbrush. This will help prevent irritation to your gums. ?General instructions ?Floss at least once a day. ?Do not apply heat to the outside of the face. ?Gargle with a mixture of salt and water 3-4 times a day or as needed. To make salt water, completely dissolve ?-1 tsp (3-6 g) of salt in 1 cup (237 mL) of warm water. ?Keep all follow-up visits. This is important. ?Contact a dental care provider if: ?You have any unexplained dental pain. ?Your pain is not controlled with medicines. ?Your symptoms get worse. ?You have new symptoms. ?Get help right away if: ?You are unable to open your mouth. ?You are having trouble breathing or swallowing. ?You have a fever. ?You notice that your face, neck, or jaw is swollen. ?These symptoms may represent a serious problem that is an emergency. Do not wait to see if the symptoms will go  away. Get medical help right away. Call your local emergency services (911 in the [place] Do not drive yourself to the hospital. ?Summary ?Dental pain may be caused by many things, including tooth decay and infection. ?Your pain may be mild or severe. ?Take over-the-counter and prescription medicines only as told by your dental care provider. ?Watch your dental pain for any changes. Let your dental care provider know if your symptoms get  worse. ?This information is not intended to replace advice given to you by your health care provider. Make sure you discuss any questions you have with your health care provider. ?Document Revised: 02/12/2020 Document Reviewed: 02/12/2020 ?Elsevier Patient Education ? [place]. ? ?

## 2021-09-29 NOTE — Progress Notes (Signed)
Virtual Visit via Telephone Note   Because of Paul Snyder's co-morbid illnesses, he is at least at moderate risk for complications without adequate follow up.  This format is felt to be most appropriate for this patient at this time.  The patient did not have access to video technology/had technical difficulties with video requiring transitioning to audio format only (telephone).  All issues noted in this document were discussed and addressed.  No physical exam could be performed with this format.  Please refer to the patient's chart for his consent to telehealth for [facility].    Date:  10/13/2021   ID:  Paul Snyder, DOB 11-Mar-1936, MRN [id number] The patient was identified using 2 identifiers.  Patient Location: Home Provider Location: Office/Clinic   PCP:  Loman Brooklyn, [facility] Providers Cardiologist:  Carlyle Dolly, MD     Evaluation Performed:  Follow-Up Visit  Chief Complaint:    History of Present Illness:    Paul Snyder is a 86 y.o. male with  history of CAD status post NSTEMI 06/2017 treated with DES to the circumflex, echo LVEF 55 to 60% with grade 2 DD, beta-blocker stopped due to bradycardia in the 40s during cardiac rehab and lisinopril stopped due to low blood pressures by PCP.  Also has hypertension hyperlipidemia COPD.  Patient last saw Dr. [name] via telemedicine 02/26/2020 and doing well no changes made.  Patient denies chest pain, dyspnea, palpitations, dizziness, edema. Uses a cane to walk. Walks a little bit.   The patient does not have symptoms concerning for COVID-19 infection (fever, chills, cough, or new shortness of breath).    Past Medical History:  Diagnosis Date   CHF (congestive heart failure) (HCC)    COPD (chronic obstructive pulmonary disease) (HCC)    GERD (gastroesophageal reflux disease)    Lung cancer ([place]) 2009   NSTEMI (non-ST elevated myocardial infarction) ([place]) 07/16/2017   2/19 PCI/DES x1 to Lcx with  moderate (50%) in the RCA, mild (20%) in LAD   Past Surgical History:  Procedure Laterality Date   ANKLE FRACTURE SURGERY Right 1970s   CATARACT EXTRACTION Left    CORONARY ANGIOPLASTY WITH STENT PLACEMENT  07/19/2017   CORONARY STENT INTERVENTION N/A 07/19/2017   Procedure: CORONARY STENT INTERVENTION;  Surgeon: [name], MD;  Location: [facility];  Service: Cardiovascular;  Laterality: N/A;   LEFT HEART CATH AND CORONARY ANGIOGRAPHY N/A 07/19/2017   Procedure: LEFT HEART CATH AND CORONARY ANGIOGRAPHY;  Surgeon: [name], MD;  Location: [facility];  Service: Cardiovascular;  Laterality: N/A;   LUNG BIOPSY       Current Meds  Medication Sig   acetaminophen (TYLENOL) 325 MG tablet Take 2 tablets (650 mg total) by mouth every 6 (six) hours as needed for mild pain (or Fever >/= 101).   albuterol (VENTOLIN HFA) 108 (90 Base) MCG/ACT inhaler Inhale 2 puffs into the lungs every 6 (six) hours as needed for wheezing or shortness of breath.   aspirin 81 MG EC tablet Take 1 tablet (81 mg total) by mouth daily with breakfast.   atorvastatin (LIPITOR) 80 MG tablet Take 1 tablet (80 mg total) by mouth every evening.   escitalopram (LEXAPRO) 20 MG tablet Take 1 tablet (20 mg total) by mouth daily.   furosemide (LASIX) 20 MG tablet Take 1 tablet (20 mg total) by mouth daily as needed (swelling or shortness of breath).   loratadine (CLARITIN) 10 MG tablet  Take 10 mg by mouth daily.   niacin 500 MG tablet Take 500 mg by mouth every morning.    pantoprazole (PROTONIX) 40 MG tablet Take 1 tablet (40 mg total) by mouth daily.   umeclidinium bromide (INCRUSE ELLIPTA) 62.5 MCG/ACT AEPB Inhale 1 puff into the lungs daily.     Allergies:   Patient has no known allergies.   Social History   Tobacco Use   Smoking status: Former    Packs/day: 1.00    Years: 42.00    Pack years: 42.00    Types: Cigarettes    Quit date: 1996    Years since quitting: 27.4   Smokeless tobacco:  Never   Tobacco comments:    Quit 07/27/1994  Vaping Use   Vaping Use: Never used  Substance Use Topics   Alcohol use: No   Drug use: Not Currently     Family Hx: The patient's family history includes Breast cancer in his sister and sister; Healthy in his mother; Heart disease in his brother; Heart failure in his brother; Hyperlipidemia in his son; Hypertension in his son.  ROS:   Please see the history of present illness.      All other systems reviewed and are negative.   Prior CV studies:   The following studies were reviewed today:    06/2017 cath Prox Cx lesion is 30% stenosed. Prox RCA to Mid RCA lesion is 55% stenosed. Mid RCA lesion is 50% stenosed. Prox LAD to Mid LAD lesion is 20% stenosed. A stent was successfully placed. Post intervention, there is a 0% residual stenosis. Mid Cx lesion is 99% stenosed.   Multi vessel CAD with proximal irregularity of the LAD, 20% narrowing of the LAD in an intramyocardial segment beyond the diagonal vessel; mildly calcified proximal circumflex with 20-30% narrowing followed by a 99% stenosis after a bend in the mid vessel; 50-60% mid, and 50% stenosis at the acute margin of the RCA.   LVEDP 14 mmHg.   Successful percutaneous coronary intervention to the left circumflex vessel with ultimate insertion of a 3.016 mm Synergy DES stent postdilated 3.21 mm with a 99% stenosis being reduced to 0%.   RECOMMENDATION: DAPT for a minimum of 1 year.  Medical therapy for concomitant CAD.   06/2017 echo Study Conclusions   - Left ventricle: The cavity size was normal. Systolic function was   normal. The estimated ejection fraction was in the range of 55%   to 60%. Mild hypokinesis of the basal-midinferolateral   myocardium; consistent with ischemia in the distribution of the   right coronary or left circumflex coronary artery. Features are   consistent with a pseudonormal left ventricular filling pattern,   with concomitant abnormal  relaxation and increased filling   pressure (grade 2 diastolic dysfunction). - Mitral valve: Calcified annulus.  Labs/Other Tests and Data Reviewed:    EKG:  No ECG reviewed.  Recent Labs: 07/26/2021: ALT 18; BUN 36; Creatinine 1.06; Hemoglobin 11.6; Platelet Count 99; Potassium 4.1; Sodium 140   Recent Lipid Panel Lab Results  Component Value Date/Time   CHOL 119 07/15/2021 08:41 AM   TRIG 39 07/15/2021 08:41 AM   HDL 49 07/15/2021 08:41 AM   CHOLHDL 2.4 07/15/2021 08:41 AM   CHOLHDL 3.9 07/20/2017 12:12 AM   LDLCALC 60 07/15/2021 08:41 AM    Wt Readings from Last 3 Encounters:  10/13/21 149 lb (67.6 kg)  09/27/21 149 lb 6 oz (67.8 kg)  08/24/21 148 lb (67.1 kg)  Risk Assessment/Calculations:          Objective:    Vital Signs:  Ht 6' (1.829 m)   Wt 149 lb (67.6 kg)   BMI 20.21 kg/m      ASSESSMENT & PLAN:     CAD status post NSTEMI 06/2017 treated with DES to the circumflex-no angina on ASA/lipitor  HLD LDL 60 07/2021  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   The importance of social distancing was discussed today.  Time:   Today, I have spent 6:34 minutes with the patient with telehealth technology discussing the above problems.     Medication Adjustments/Labs and Tests Ordered: Current medicines are reviewed at length with the patient today.  Concerns regarding medicines are outlined above.   Tests Ordered: No orders of the defined types were placed in this encounter.   Medication Changes: No orders of the defined types were placed in this encounter.   Follow Up:  In Person in 1 year(s)  Signed, Ermalinda Barrios, PA-C  10/13/2021 10:55 AM    [facility]

## 2021-10-13 ENCOUNTER — Telehealth: Payer: Self-pay

## 2021-10-13 ENCOUNTER — Ambulatory Visit (INDEPENDENT_AMBULATORY_CARE_PROVIDER_SITE_OTHER): Payer: Medicare Other | Admitting: Physician Assistant

## 2021-10-13 ENCOUNTER — Encounter: Payer: Self-pay | Admitting: Physician Assistant

## 2021-10-13 ENCOUNTER — Other Ambulatory Visit: Payer: Self-pay | Admitting: Family Medicine

## 2021-10-13 VITALS — Ht 72.0 in | Wt 149.0 lb

## 2021-10-13 DIAGNOSIS — R001 Bradycardia, unspecified: Secondary | ICD-10-CM

## 2021-10-13 DIAGNOSIS — I5189 Other ill-defined heart diseases: Secondary | ICD-10-CM

## 2021-10-13 DIAGNOSIS — I2510 Atherosclerotic heart disease of native coronary artery without angina pectoris: Secondary | ICD-10-CM | POA: Diagnosis not present

## 2021-10-13 DIAGNOSIS — J449 Chronic obstructive pulmonary disease, unspecified: Secondary | ICD-10-CM

## 2021-10-13 DIAGNOSIS — E7849 Other hyperlipidemia: Secondary | ICD-10-CM

## 2021-10-13 NOTE — Patient Instructions (Signed)
Medication Instructions:  Your physician recommends that you continue on your current medications as directed. Please refer to the Current Medication list given to you today.   Labwork: None today  Testing/Procedures: None today  Follow-Up: 1 year in Office  Any Other Special Instructions Will Be Listed Below (If Applicable).  If you need a refill on your cardiac medications before your next appointment, please call your pharmacy.

## 2021-10-13 NOTE — Telephone Encounter (Signed)
  Patient Consent for Virtual Visit        Paul Snyder has provided verbal consent on 10/13/2021 for a virtual visit (video or telephone).   CONSENT FOR VIRTUAL VISIT FOR:  Paul Snyder  By participating in this virtual visit I agree to the following:  I hereby voluntarily request, consent and authorize [facility] and its employed or contracted physicians, physician assistants, nurse practitioners or other licensed health care professionals (the Practitioner), to provide me with telemedicine health care services (the "Services") as deemed necessary by the treating Practitioner. I acknowledge and consent to receive the Services by the Practitioner via telemedicine. I understand that the telemedicine visit will involve communicating with the Practitioner through live audiovisual communication technology and the disclosure of certain medical information by electronic transmission. I acknowledge that I have been given the opportunity to request an in-person assessment or other available alternative prior to the telemedicine visit and am voluntarily participating in the telemedicine visit.  I understand that I have the right to withhold or withdraw my consent to the use of telemedicine in the course of my care at any time, without affecting my right to future care or treatment, and that the Practitioner or I may terminate the telemedicine visit at any time. I understand that I have the right to inspect all information obtained and/or recorded in the course of the telemedicine visit and may receive copies of available information for a reasonable fee.  I understand that some of the potential risks of receiving the Services via telemedicine include:  Delay or interruption in medical evaluation due to technological equipment failure or disruption; Information transmitted may not be sufficient (e.g. poor resolution of images) to allow for appropriate medical decision making by the Practitioner; and/or   In rare instances, security protocols could fail, causing a breach of personal health information.  Furthermore, I acknowledge that it is my responsibility to provide information about my medical history, conditions and care that is complete and accurate to the best of my ability. I acknowledge that Practitioner's advice, recommendations, and/or decision may be based on factors not within their control, such as incomplete or inaccurate data provided by me or distortions of diagnostic images or specimens that may result from electronic transmissions. I understand that the practice of medicine is not an exact science and that Practitioner makes no warranties or guarantees regarding treatment outcomes. I acknowledge that a copy of this consent can be made available to me via my patient portal ([facility]), or I can request a printed copy by calling the office of [facility].    I understand that my insurance will be billed for this visit.   I have read or had this consent read to me. I understand the contents of this consent, which adequately explains the benefits and risks of the Services being provided via telemedicine.  I have been provided ample opportunity to ask questions regarding this consent and the Services and have had my questions answered to my satisfaction. I give my informed consent for the services to be provided through the use of telemedicine in my medical care

## 2021-10-29 ENCOUNTER — Encounter: Payer: Self-pay | Admitting: Hematology & Oncology

## 2021-10-29 ENCOUNTER — Inpatient Hospital Stay: Payer: Medicare Other | Attending: Hematology & Oncology

## 2021-10-29 ENCOUNTER — Other Ambulatory Visit: Payer: Self-pay | Admitting: Lab

## 2021-10-29 ENCOUNTER — Inpatient Hospital Stay (HOSPITAL_BASED_OUTPATIENT_CLINIC_OR_DEPARTMENT_OTHER): Payer: Medicare Other | Admitting: Hematology & Oncology

## 2021-10-29 VITALS — BP 121/42 | HR 49 | Temp 97.7°F | Resp 17 | Wt 146.0 lb

## 2021-10-29 DIAGNOSIS — D696 Thrombocytopenia, unspecified: Secondary | ICD-10-CM | POA: Insufficient documentation

## 2021-10-29 DIAGNOSIS — D649 Anemia, unspecified: Secondary | ICD-10-CM | POA: Insufficient documentation

## 2021-10-29 DIAGNOSIS — Z79899 Other long term (current) drug therapy: Secondary | ICD-10-CM | POA: Insufficient documentation

## 2021-10-29 DIAGNOSIS — R1319 Other dysphagia: Secondary | ICD-10-CM

## 2021-10-29 DIAGNOSIS — R634 Abnormal weight loss: Secondary | ICD-10-CM | POA: Diagnosis not present

## 2021-10-29 DIAGNOSIS — Z7982 Long term (current) use of aspirin: Secondary | ICD-10-CM | POA: Diagnosis not present

## 2021-10-29 DIAGNOSIS — R7871 Abnormal lead level in blood: Secondary | ICD-10-CM | POA: Diagnosis not present

## 2021-10-29 LAB — CBC WITH DIFFERENTIAL (CANCER CENTER ONLY)
Abs Immature Granulocytes: 0.02 10*3/uL (ref 0.00–0.07)
Basophils Absolute: 0 10*3/uL (ref 0.0–0.1)
Basophils Relative: 1 %
Eosinophils Absolute: 0.1 10*3/uL (ref 0.0–0.5)
Eosinophils Relative: 2 %
HCT: 35.9 % — ABNORMAL LOW (ref 39.0–52.0)
Hemoglobin: 11.6 g/dL — ABNORMAL LOW (ref 13.0–17.0)
Immature Granulocytes: 0 %
Lymphocytes Relative: 16 %
Lymphs Abs: 1.1 10*3/uL (ref 0.7–4.0)
MCH: 30.3 pg (ref 26.0–34.0)
MCHC: 32.3 g/dL (ref 30.0–36.0)
MCV: 93.7 fL (ref 80.0–100.0)
Monocytes Absolute: 0.7 10*3/uL (ref 0.1–1.0)
Monocytes Relative: 11 %
Neutro Abs: 4.7 10*3/uL (ref 1.7–7.7)
Neutrophils Relative %: 70 %
Platelet Count: 123 10*3/uL — ABNORMAL LOW (ref 150–400)
RBC: 3.83 MIL/uL — ABNORMAL LOW (ref 4.22–5.81)
RDW: 14.6 % (ref 11.5–15.5)
WBC Count: 6.7 10*3/uL (ref 4.0–10.5)
nRBC: 0 % (ref 0.0–0.2)

## 2021-10-29 LAB — IRON AND IRON BINDING CAPACITY (CC-WL,HP ONLY)
Iron: 101 ug/dL (ref 45–182)
Saturation Ratios: 47 % — ABNORMAL HIGH (ref 17.9–39.5)
TIBC: 214 ug/dL — ABNORMAL LOW (ref 250–450)
UIBC: 113 ug/dL — ABNORMAL LOW (ref 117–376)

## 2021-10-29 LAB — RETICULOCYTES
Immature Retic Fract: 4 % (ref 2.3–15.9)
RBC.: 3.83 MIL/uL — ABNORMAL LOW (ref 4.22–5.81)
Retic Count, Absolute: 38.3 10*3/uL (ref 19.0–186.0)
Retic Ct Pct: 1 % (ref 0.4–3.1)

## 2021-10-29 LAB — CMP (CANCER CENTER ONLY)
ALT: 12 U/L (ref 0–44)
AST: 15 U/L (ref 15–41)
Albumin: 3.9 g/dL (ref 3.5–5.0)
Alkaline Phosphatase: 62 U/L (ref 38–126)
Anion gap: 4 — ABNORMAL LOW (ref 5–15)
BUN: 32 mg/dL — ABNORMAL HIGH (ref 8–23)
CO2: 30 mmol/L (ref 22–32)
Calcium: 9.5 mg/dL (ref 8.9–10.3)
Chloride: 106 mmol/L (ref 98–111)
Creatinine: 1.18 mg/dL (ref 0.61–1.24)
GFR, Estimated: >60 mL/min (ref >60)
Glucose, Bld: 102 mg/dL — ABNORMAL HIGH (ref 70–99)
Potassium: 4.4 mmol/L (ref 3.5–5.1)
Sodium: 140 mmol/L (ref 135–145)
Total Bilirubin: 0.6 mg/dL (ref 0.3–1.2)
Total Protein: 5.8 g/dL — ABNORMAL LOW (ref 6.5–8.1)

## 2021-10-29 LAB — SAVE SMEAR(SSMR), FOR PROVIDER SLIDE REVIEW

## 2021-10-29 LAB — FERRITIN: Ferritin: 50 ng/mL (ref 24–336)

## 2021-10-29 LAB — LACTATE DEHYDROGENASE: LDH: 97 U/L — ABNORMAL LOW (ref 98–192)

## 2021-10-29 NOTE — Progress Notes (Signed)
Hematology and Oncology Follow Up Visit  H[place]Y [place] [id number] Jul 24, 1935 86 y.o. 10/29/2021   Principle Diagnosis:  Anemia/thrombocytopenia -likely myelodysplasia  Current Therapy:   Observation     Interim History:  Mr. Fikes is back for follow-up.  This is his second office visit.  We first saw him back in March.  At that time, he had mild anemia and thrombocytopenia.  His studies really were not all that revealing.  He did have a very low erythropoietin level of 6.5.  He has had no problems with bleeding.  He has lost weight.  He is quite anxious.  He comes in with his son.  I reassured him that I thought that he was going to be okay.  He has had no problems with pain.  There is no change in bowel or bladder habits.  He has had no nausea or vomiting.  When we first saw him, his ferritin was 48 with an iron saturation of 52%.  He comes in a wheelchair.  I would have to say that overall, his performance status is probably ECOG 2-3.   Medications:  Current Outpatient Medications:    acetaminophen (TYLENOL) 325 MG tablet, Take 2 tablets (650 mg total) by mouth every 6 (six) hours as needed for mild pain (or Fever >/= 101)., Disp: 12 tablet, Rfl: 0   albuterol (VENTOLIN HFA) 108 (90 Base) MCG/ACT inhaler, Inhale 2 puffs into the lungs every 6 (six) hours as needed for wheezing or shortness of breath., Disp: 18 g, Rfl: 2   aspirin 81 MG EC tablet, Take 1 tablet (81 mg total) by mouth daily with breakfast., Disp: 30 tablet, Rfl: 12   atorvastatin (LIPITOR) 80 MG tablet, Take 1 tablet (80 mg total) by mouth every evening., Disp: 90 tablet, Rfl: 1   chlorhexidine (PERIDEX) 0.12 % solution, SMARTSIG:By Mouth, Disp: , Rfl:    escitalopram (LEXAPRO) 20 MG tablet, Take 1 tablet (20 mg total) by mouth daily., Disp: 90 tablet, Rfl: 1   furosemide (LASIX) 20 MG tablet, Take 1 tablet (20 mg total) by mouth daily as needed (swelling or shortness of breath)., Disp: 90 tablet, Rfl: 1   INCRUSE  ELLIPTA 62.5 MCG/ACT AEPB, INHALE 1 PUFF INTO LUNGS ONCE DAILY, Disp: 30 each, Rfl: 2   loratadine (CLARITIN) 10 MG tablet, Take 10 mg by mouth daily., Disp: , Rfl:    niacin 500 MG tablet, Take 500 mg by mouth every morning. , Disp: , Rfl:    pantoprazole (PROTONIX) 40 MG tablet, Take 1 tablet (40 mg total) by mouth daily., Disp: 90 tablet, Rfl: 1  Allergies: No Known Allergies  Past Medical History, Surgical history, Social history, and Family History were reviewed and updated.  Review of Systems: Review of Systems  Constitutional:  Positive for fatigue and unexpected weight change.  HENT:  Negative.    Eyes: Negative.   Respiratory: Negative.    Cardiovascular: Negative.   Gastrointestinal: Negative.   Endocrine: Negative.   Genitourinary: Negative.    Musculoskeletal: Negative.   Skin: Negative.   Neurological: Negative.   Hematological: Negative.   Psychiatric/Behavioral:  The patient is nervous/anxious.     Physical Exam:  weight is 146 lb (66.2 kg). His oral temperature is 97.7 F (36.5 C). His blood pressure is 121/42 (abnormal) and his pulse is 49 (abnormal). His respiration is 17 and oxygen saturation is 98%.   Wt Readings from Last 3 Encounters:  10/29/21 146 lb (66.2 kg)  10/13/21 149 lb (67.6 kg)  09/27/21 149 lb 6 oz (67.8 kg)    Physical Exam Vitals reviewed.  HENT:     Head: Normocephalic and atraumatic.  Eyes:     Pupils: Pupils are equal, round, and reactive to light.  Cardiovascular:     Rate and Rhythm: Normal rate and regular rhythm.     Heart sounds: Normal heart sounds.  Pulmonary:     Effort: Pulmonary effort is normal.     Breath sounds: Normal breath sounds.  Abdominal:     General: Bowel sounds are normal.     Palpations: Abdomen is soft.  Musculoskeletal:        General: No tenderness or deformity. Normal range of motion.     Cervical back: Normal range of motion.  Lymphadenopathy:     Cervical: No cervical adenopathy.  Skin:     General: Skin is warm and dry.     Findings: No erythema or rash.  Neurological:     Mental Status: He is alert and oriented to person, place, and time.  Psychiatric:        Behavior: Behavior normal.        Thought Content: Thought content normal.        Judgment: Judgment normal.    Lab Results  Component Value Date   WBC 6.7 10/29/2021   HGB 11.6 (L) 10/29/2021   HCT 35.9 (L) 10/29/2021   MCV 93.7 10/29/2021   PLT 123 (L) 10/29/2021     Chemistry      Component Value Date/Time   NA 140 10/29/2021 1109   NA 146 (H) 07/15/2021 0841   K 4.4 10/29/2021 1109   CL 106 10/29/2021 1109   CO2 30 10/29/2021 1109   BUN 32 (H) 10/29/2021 1109   BUN 23 07/15/2021 0841   CREATININE 1.18 10/29/2021 1109      Component Value Date/Time   CALCIUM 9.5 10/29/2021 1109   ALKPHOS 62 10/29/2021 1109   AST 15 10/29/2021 1109   ALT 12 10/29/2021 1109   BILITOT 0.6 10/29/2021 1109     I reviewed his blood smear.  He has normochromic and normocytic population of red blood cells.  I see no immature myeloid cells.  I see no hypersegmented polys.  He has no teardrop cells.  There is no nucleated red blood cells.  I see no rouleaux formation.  His platelets are slightly decreased in number.  He seems to have small platelets.  Impression and Plan: Mr. Ayars is a very nice 86 year old white male.  He has mild anemia and thrombocytopenia.  He has a low erythropoietin level.  I suspect this probably is a problem with the anemia.  However, I really do not think that we have to do anything right now.  He does not need a bone marrow test.  He would not be a candidate for any intervention with respect to myelodysplasia.  Again, his quality of life really is not affected by the anemia and thrombocytopenia.  I think that we can just follow him for right now.  We will plan to get him back in about 4 months.  I think we get him through the Summer.  He is incredibly nice.  He comes in with his son.  [place].   Volanda Napoleon, MD 6/9/202312:36 PM

## 2021-12-14 ENCOUNTER — Ambulatory Visit (INDEPENDENT_AMBULATORY_CARE_PROVIDER_SITE_OTHER): Payer: Medicare Other

## 2021-12-14 VITALS — Wt 146.0 lb

## 2021-12-14 DIAGNOSIS — Z0000 Encounter for general adult medical examination without abnormal findings: Secondary | ICD-10-CM | POA: Diagnosis not present

## 2021-12-14 NOTE — Progress Notes (Signed)
Subjective:   Paul Snyder is a 86 y.o. male who presents for Medicare Annual/Subsequent preventive examination.  Virtual Visit via Telephone Note  I connected with  Paul Snyder on 12/14/21 at  8:15 AM EDT by telephone and verified that I am speaking with the correct person using two identifiers.  Location: Patient: Home Provider: [facility] Persons participating in the virtual visit: patient and daughter Paul Snyder, and Nurse Health Advisor   I discussed the limitations, risks, security and privacy concerns of performing an evaluation and management service by telephone and the availability of in person appointments. The patient expressed understanding and agreed to proceed.  Interactive audio and video telecommunications were attempted between this nurse and patient, however failed, due to patient having technical difficulties OR patient did not have access to video capability.  We continued and completed visit with audio only.  Some vital signs may be absent or patient reported.   Paul Kirker E Selicia Windom, LPN   Review of Systems     Cardiac Risk Factors include: advanced age (>11men, >23 women);male gender;hypertension;sedentary lifestyle;smoking/ tobacco exposure;Other (see comment), Risk factor comments: CAD, COPD, hx of MI, malnutrion, diastolic dysfunction     Objective:    Today's Vitals   12/14/21 0820  Weight: 146 lb (66.2 kg)  PainSc: 4    Body mass index is 19.8 kg/m.     12/14/2021    8:31 AM 10/29/2021   11:32 AM 07/26/2021   10:53 AM 12/11/2020   11:58 AM 07/10/2019    8:32 AM 07/08/2019   11:16 AM 07/05/2019    9:35 PM  Advanced Directives  Does Patient Have a Medical Advance Directive? Yes Yes Yes Yes Yes Yes No  Type of Paramedic of [place];Living will Living will [place];Living will [place];Living will [place];Living will    Does patient want to make changes to medical advance  directive?  No - Patient declined No - Patient declined  No - Patient declined    Copy of [place] in Chart? No - copy requested  No - copy requested No - copy requested No - copy requested      Current Medications (verified) Outpatient Encounter Medications as of 12/14/2021  Medication Sig   acetaminophen (TYLENOL) 325 MG tablet Take 2 tablets (650 mg total) by mouth every 6 (six) hours as needed for mild pain (or Fever >/= 101).   albuterol (VENTOLIN HFA) 108 (90 Base) MCG/ACT inhaler Inhale 2 puffs into the lungs every 6 (six) hours as needed for wheezing or shortness of breath.   aspirin 81 MG EC tablet Take 1 tablet (81 mg total) by mouth daily with breakfast.   atorvastatin (LIPITOR) 80 MG tablet Take 1 tablet (80 mg total) by mouth every evening.   chlorhexidine (PERIDEX) 0.12 % solution SMARTSIG:By Mouth   escitalopram (LEXAPRO) 20 MG tablet Take 1 tablet (20 mg total) by mouth daily.   INCRUSE ELLIPTA 62.5 MCG/ACT AEPB INHALE 1 PUFF INTO LUNGS ONCE DAILY   loratadine (CLARITIN) 10 MG tablet Take 10 mg by mouth daily.   niacin 500 MG tablet Take 500 mg by mouth every morning.    pantoprazole (PROTONIX) 40 MG tablet Take 1 tablet (40 mg total) by mouth daily.   furosemide (LASIX) 20 MG tablet Take 1 tablet (20 mg total) by mouth daily as needed (swelling or shortness of breath). (Patient not taking: Reported on 12/14/2021)   No facility-administered encounter  medications on file as of 12/14/2021.    Allergies (verified) Patient has no known allergies.   History: Past Medical History:  Diagnosis Date   CHF (congestive heart failure) (HCC)    COPD (chronic obstructive pulmonary disease) (HCC)    GERD (gastroesophageal reflux disease)    Lung cancer ([place]) 2009   NSTEMI (non-ST elevated myocardial infarction) ([place]) 07/16/2017   2/19 PCI/DES x1 to Lcx with moderate (50%) in the RCA, mild (20%) in LAD   Past Surgical History:  Procedure Laterality Date   ANKLE  FRACTURE SURGERY Right 1970s   CATARACT EXTRACTION Left    CORONARY ANGIOPLASTY WITH STENT PLACEMENT  07/19/2017   CORONARY STENT INTERVENTION N/A 07/19/2017   Procedure: CORONARY STENT INTERVENTION;  Surgeon: Paul Sine, MD;  Location: [facility];  Service: Cardiovascular;  Laterality: N/A;   LEFT HEART CATH AND CORONARY ANGIOGRAPHY N/A 07/19/2017   Procedure: LEFT HEART CATH AND CORONARY ANGIOGRAPHY;  Surgeon: Paul Sine, MD;  Location: [facility];  Service: Cardiovascular;  Laterality: N/A;   LUNG BIOPSY     Family History  Problem Relation Age of Onset   Healthy Mother    Breast cancer Sister    Heart disease Brother    Heart failure Brother    Breast cancer Sister    Hypertension Son    Hyperlipidemia Son    Social History   Socioeconomic History   Marital status: Widowed    Spouse name: Not on file   Number of children: 2   Years of education: 7   Highest education level: 7th grade  Occupational History   Occupation: retired  Tobacco Use   Smoking status: Former    Packs/day: 1.00    Years: 42.00    Total pack years: 42.00    Types: Cigarettes    Quit date: 1996    Years since quitting: 27.5   Smokeless tobacco: Never   Tobacco comments:    Quit 07/27/1994  Vaping Use   Vaping Use: Never used  Substance and Sexual Activity   Alcohol use: No   Drug use: Not Currently   Sexual activity: Not Currently    Birth control/protection: None  Other Topics Concern   Not on file  Social History Narrative   Lives by himself, son lives next door, daughter 15 minutes away   Social Determinants of Health   Financial Resource Strain: Low Risk  (12/14/2021)   Overall Financial Resource Strain (CARDIA)    Difficulty of Paying Living Expenses: Not hard at all  Food Insecurity: No Food Insecurity (12/14/2021)   Hunger Vital Sign    Worried About Running Out of Food in the Last Year: Never true    [place] in the Last Year: Never true   Transportation Needs: No Transportation Needs (12/14/2021)   PRAPARE - Hydrologist (Medical): No    Lack of Transportation (Non-Medical): No  Physical Activity: Inactive (12/14/2021)   Exercise Vital Sign    Days of Exercise per Week: 0 days    Minutes of Exercise per Session: 0 min  Stress: No Stress Concern Present (12/14/2021)   [place]    Feeling of Stress : Not at all  Social Connections: Moderately Integrated (12/14/2021)   Social Connection and Isolation Panel [NHANES]    Frequency of Communication with Friends and Family: More than three times a week    Frequency of Social  Gatherings with Friends and Family: More than three times a week    Attends Religious Services: More than 4 times per year    Active Member of Clubs or Organizations: Yes    Attends Archivist Meetings: More than 4 times per year    Marital Status: Widowed    Tobacco Counseling Counseling given: Not Answered Tobacco comments: Quit 07/27/1994   Clinical Intake:  Pre-visit preparation completed: Yes  Pain : 0-10 Pain Score: 4  Pain Type: Chronic pain Pain Location: Jaw Pain Orientation: Left Pain Radiating Towards: Left ear Pain Descriptors / Indicators: Aching, Sore, Tender Pain Onset: More than a month ago Pain Frequency: Intermittent     BMI - recorded: 19.8 Nutritional Status: BMI of 19-24  Normal Nutritional Risks: Other (Comment) (oral cancer - hurts jaw) Diabetes: No  How often do you need to have someone help you when you read instructions, pamphlets, or other written materials from your doctor or pharmacy?: 1 - Never  Diabetic? no  Interpreter Needed?: No  Information entered by :: Deontray Hunnicutt, LPN   Activities of Daily Living    12/14/2021    8:31 AM  In your present state of health, do you have any difficulty performing the following activities:  Hearing? 1  Vision? 0   Difficulty concentrating or making decisions? 1  Walking or climbing stairs? 1  Dressing or bathing? 0  Doing errands, shopping? 1  Comment drives locally only  Preparing Food and eating ? N  Using the Toilet? N  In the past six months, have you accidently leaked urine? N  Do you have problems with loss of bowel control? N  Managing your Medications? N  Managing your Finances? N  Housekeeping or managing your Housekeeping? Y    Patient Care Team: Loman [place], FNP as PCP - General (Family Medicine) [name] Alphonse Guild, MD as PCP - Cardiology (Cardiology) [name] Alphonse Guild, MD as Consulting Physician (Cardiology) Philomena Doheny, MD as Referring Physician (Plastic Surgery) Volanda Napoleon, MD as Consulting Physician (Oncology) Harlen Labs, MD as Referring Physician (Optometry)  Indicate any recent Medical Services you may have received from other than [facility] providers in the past year (date may be approximate).     Assessment:   This is a routine wellness examination for [place].  Hearing/Vision screen Hearing Screening - Comments:: C/o mild hearing difficulties - declines hearing aids Vision Screening - Comments:: Wears rx glasses - up to date with routine eye exams with [facility]  Dietary issues and exercise activities discussed: Current Exercise Habits: The patient does not participate in regular exercise at present, Exercise limited by: cardiac condition(s);respiratory conditions(s);orthopedic condition(s)   Goals Addressed             This Visit's Progress    Prevent falls   Not on track      Depression Screen    12/14/2021    8:27 AM 09/27/2021   11:05 AM 08/24/2021   12:40 PM 07/15/2021    8:10 AM 04/14/2021    9:07 AM 02/26/2021    9:43 AM 01/15/2021   11:05 AM  PHQ 2/9 Scores  PHQ - 2 Score 0 0 2 0 3 2 6   PHQ- 9 Score  0 10 1 11 8 19     Fall Risk    12/14/2021    8:22 AM 09/27/2021   11:05 AM 08/24/2021   12:42 PM 07/15/2021    8:10  AM 02/26/2021  9:42 AM  Fall Risk   Falls in the past year? 1 0 1 1 1   Number falls in past yr: 1  1 1 1   Injury with Fall? 1  0 0 0  Risk for fall due to : History of fall(s);Impaired balance/gait;Orthopedic patient      Follow up Education provided;Falls prevention discussed   Falls prevention discussed Falls prevention discussed    FALL RISK PREVENTION PERTAINING TO THE HOME:  Any stairs in or around the home? Yes  If so, are there any without handrails? No  Home free of loose throw rugs in walkways, pet beds, electrical cords, etc? Yes  Adequate lighting in your home to reduce risk of falls? Yes   ASSISTIVE DEVICES UTILIZED TO PREVENT FALLS:  Life alert? Yes  - he doesn't like to wear it Use of a cane, walker or w/c? Yes  Grab bars in the bathroom? No  Shower chair or bench in shower? No  Elevated toilet seat or a handicapped toilet? No   TIMED UP AND GO:  Was the test performed? No . Telephonic visit  Cognitive Function:        12/14/2021    8:35 AM 12/11/2020   11:44 AM 07/10/2019    8:36 AM  6CIT Screen  What Year? 0 points 0 points 0 points  What month? 0 points 0 points 0 points  What time? 0 points 0 points 0 points  Count back from 20 0 points 0 points 0 points  Months in reverse 4 points 4 points 4 points  Repeat phrase 6 points 6 points 2 points  Total Score 10 points 10 points 6 points    Immunizations Immunization History  Administered Date(s) Administered   Fluad Quad(high Dose 65+) 03/08/2017, 02/25/2019, 02/26/2021   Influenza Split 04/10/2015   Influenza, Seasonal, Injecte, Preservative Fre 03/11/2003   Influenza-Unspecified 04/24/2002, 03/08/2017, 02/25/2019, 03/25/2020   Moderna SARS-COV2 Booster Vaccination 03/28/2020   Moderna Sars-Covid-2 Vaccination 06/15/2019, 07/18/2019, 03/28/2020   PNEUMOCOCCAL CONJUGATE-20 04/14/2021   Pneumococcal Conjugate-13 08/09/2016   Pneumococcal Polysaccharide-23 11/10/2008, 08/25/2017, 01/23/2018    Pneumococcal-Unspecified 08/09/2016   Tdap 03/12/2019   Zoster Recombinat (Shingrix) 01/04/2019, 04/06/2019    TDAP status: Up to date  Flu Vaccine status: Up to date  Pneumococcal vaccine status: Up to date  Covid-19 vaccine status: Completed vaccines  Qualifies for Shingles Vaccine? Yes   Zostavax completed Yes   Shingrix Completed?: Yes  Screening Tests Health Maintenance  Topic Date Due   COVID-19 Vaccine (4 - Booster for Moderna series) 05/23/2020   INFLUENZA VACCINE  12/21/2021   TETANUS/TDAP  03/11/2029   Pneumonia Vaccine 68+ Years old  Completed   Zoster Vaccines- Shingrix  Completed   HPV VACCINES  Aged Out    Health Maintenance  Health Maintenance Due  Topic Date Due   COVID-19 Vaccine (4 - Booster for Moderna series) 05/23/2020    Colorectal cancer screening: No longer required.   Lung Cancer Screening: (Low Dose CT Chest recommended if Age 43-80 years, 30 pack-year currently smoking OR have quit w/in 15years.) does not qualify.   Additional Screening:  Hepatitis C Screening: does not qualify  Vision Screening: Recommended annual ophthalmology exams for early detection of glaucoma and other disorders of the eye. Is the patient up to date with their annual eye exam?  Yes  Who is the provider or what is the name of the office in which the patient attends annual eye exams? [facility] If pt is  not established with a provider, would they like to be referred to a provider to establish care? No .   Dental Screening: Recommended annual dental exams for proper oral hygiene  Community Resource Referral / Chronic Care Management: CRR required this visit?  No   CCM required this visit?  No      Plan:     I have personally reviewed and noted the following in the patient's chart:   Medical and social history Use of alcohol, tobacco or illicit drugs  Current medications and supplements including opioid prescriptions. Patient is not currently  taking opioid prescriptions. Functional ability and status Nutritional status Physical activity Advanced directives List of other physicians Hospitalizations, surgeries, and ER visits in previous 12 months Vitals Screenings to include cognitive, depression, and falls Referrals and appointments  In addition, I have reviewed and discussed with patient certain preventive protocols, quality metrics, and best practice recommendations. A written personalized care plan for preventive services as well as general preventive health recommendations were provided to patient.     Sandrea Hammond, LPN   [id number]   Nurse Notes: July 11, he was diagnosed with oral cancer - has appt with specialist at [facility] tomorrow - he will keep [place] posted

## 2021-12-14 NOTE — Patient Instructions (Signed)
Mr. Potempa , Thank you for taking time to come for your Medicare Wellness Visit. I appreciate your ongoing commitment to your health goals. Please review the following plan we discussed and let me know if I can assist you in the future.   Screening recommendations/referrals: Colonoscopy: no longer required Recommended yearly ophthalmology/optometry visit for glaucoma screening and checkup Recommended yearly dental visit for hygiene and checkup  Vaccinations: Influenza vaccine: Done 02/26/2021 - Repeat annually  Pneumococcal vaccine: Done 08/09/2016, 01/23/2018, 04/14/2021     Tdap vaccine: Done 03/12/2019 - Repeat in 10 years  Shingles vaccine: Done   01/04/2019 & 04/06/2019  Covid-19: Done 06/15/2019, 07/18/2019, 03/28/2020   Advanced directives: Please bring a copy of your health care power of attorney and living will to the office to be added to your chart at your convenience.   Conditions/risks identified: Aim for 4-6 glasses of water, plenty of protein in your diet and try to get up and walk/ stretch every hour for 5-10 minutes at a time.   Next appointment: Follow up in one year for your annual wellness visit.   Preventive Care 84 Years and Older, Male  Preventive care refers to lifestyle choices and visits with your health care provider that can promote health and wellness. What does preventive care include? A yearly physical exam. This is also called an annual well check. Dental exams once or twice a year. Routine eye exams. Ask your health care provider how often you should have your eyes checked. Personal lifestyle choices, including: Daily care of your teeth and gums. Regular physical activity. Eating a healthy diet. Avoiding tobacco and drug use. Limiting alcohol use. Practicing safe sex. Taking low doses of aspirin every day. Taking vitamin and mineral supplements as recommended by your health care provider. What happens during an annual well check? The services and  screenings done by your health care provider during your annual well check will depend on your age, overall health, lifestyle risk factors, and family history of disease. Counseling  Your health care provider may ask you questions about your: Alcohol use. Tobacco use. Drug use. Emotional well-being. Home and relationship well-being. Sexual activity. Eating habits. History of falls. Memory and ability to understand (cognition). Work and work Statistician. Screening  You may have the following tests or measurements: Height, weight, and BMI. Blood pressure. Lipid and cholesterol levels. These may be checked every 5 years, or more frequently if you are over 84 years old. Skin check. Lung cancer screening. You may have this screening every year starting at age 84 if you have a 30-pack-year history of smoking and currently smoke or have quit within the past 15 years. Fecal occult blood test (FOBT) of the stool. You may have this test every year starting at age 84. Flexible sigmoidoscopy or colonoscopy. You may have a sigmoidoscopy every 5 years or a colonoscopy every 10 years starting at age 84. Prostate cancer screening. Recommendations will vary depending on your family history and other risks. Hepatitis C blood test. Hepatitis B blood test. Sexually transmitted disease (STD) testing. Diabetes screening. This is done by checking your blood sugar (glucose) after you have not eaten for a while (fasting). You may have this done every 1-3 years. Abdominal aortic aneurysm (AAA) screening. You may need this if you are a current or former smoker. Osteoporosis. You may be screened starting at age 47 if you are at high risk. Talk with your health care provider about your test results, treatment options, and if necessary, the  need for more tests. Vaccines  Your health care provider may recommend certain vaccines, such as: Influenza vaccine. This is recommended every year. Tetanus, diphtheria, and  acellular pertussis (Tdap, Td) vaccine. You may need a Td booster every 10 years. Zoster vaccine. You may need this after age 84. Pneumococcal 13-valent conjugate (PCV13) vaccine. One dose is recommended after age 84. Pneumococcal polysaccharide (PPSV23) vaccine. One dose is recommended after age 84. Talk to your health care provider about which screenings and vaccines you need and how often you need them. This information is not intended to replace advice given to you by your health care provider. Make sure you discuss any questions you have with your health care provider. Document Released: 06/05/2015 Document Revised: 01/27/2016 Document Reviewed: 03/10/2015 Elsevier Interactive Patient Education  2017 [employer] Prevention in the Home Falls can cause injuries. They can happen to people of all ages. There are many things you can do to make your home safe and to help prevent falls. What can I do on the outside of my home? Regularly fix the edges of walkways and driveways and fix any cracks. Remove anything that might make you trip as you walk through a door, such as a raised step or threshold. Trim any bushes or trees on the path to your home. Use bright outdoor lighting. Clear any walking paths of anything that might make someone trip, such as rocks or tools. Regularly check to see if handrails are loose or broken. Make sure that both sides of any steps have handrails. Any raised decks and porches should have guardrails on the edges. Have any leaves, snow, or ice cleared regularly. Use sand or salt on walking paths during winter. Clean up any spills in your garage right away. This includes oil or grease spills. What can I do in the bathroom? Use night lights. Install grab bars by the toilet and in the tub and shower. Do not use towel bars as grab bars. Use non-skid mats or decals in the tub or shower. If you need to sit down in the shower, use a plastic, non-slip stool. Keep  the floor dry. Clean up any water that spills on the floor as soon as it happens. Remove soap buildup in the tub or shower regularly. Attach bath mats securely with double-sided non-slip rug tape. Do not have throw rugs and other things on the floor that can make you trip. What can I do in the bedroom? Use night lights. Make sure that you have a light by your bed that is easy to reach. Do not use any sheets or blankets that are too big for your bed. They should not hang down onto the floor. Have a firm chair that has side arms. You can use this for support while you get dressed. Do not have throw rugs and other things on the floor that can make you trip. What can I do in the kitchen? Clean up any spills right away. Avoid walking on wet floors. Keep items that you use a lot in easy-to-reach places. If you need to reach something above you, use a strong step stool that has a grab bar. Keep electrical cords out of the way. Do not use floor polish or wax that makes floors slippery. If you must use wax, use non-skid floor wax. Do not have throw rugs and other things on the floor that can make you trip. What can I do with my stairs? Do not leave any items on the  stairs. Make sure that there are handrails on both sides of the stairs and use them. Fix handrails that are broken or loose. Make sure that handrails are as long as the stairways. Check any carpeting to make sure that it is firmly attached to the stairs. Fix any carpet that is loose or worn. Avoid having throw rugs at the top or bottom of the stairs. If you do have throw rugs, attach them to the floor with carpet tape. Make sure that you have a light switch at the top of the stairs and the bottom of the stairs. If you do not have them, ask someone to add them for you. What else can I do to help prevent falls? Wear shoes that: Do not have high heels. Have rubber bottoms. Are comfortable and fit you well. Are closed at the toe. Do not  wear sandals. If you use a stepladder: Make sure that it is fully opened. Do not climb a closed stepladder. Make sure that both sides of the stepladder are locked into place. Ask someone to hold it for you, if possible. Clearly mark and make sure that you can see: Any grab bars or handrails. First and last steps. Where the edge of each step is. Use tools that help you move around (mobility aids) if they are needed. These include: Canes. Walkers. Scooters. Crutches. Turn on the lights when you go into a dark area. Replace any light bulbs as soon as they burn out. Set up your furniture so you have a clear path. Avoid moving your furniture around. If any of your floors are uneven, fix them. If there are any pets around you, be aware of where they are. Review your medicines with your doctor. Some medicines can make you feel dizzy. This can increase your chance of falling. Ask your doctor what other things that you can do to help prevent falls. This information is not intended to replace advice given to you by your health care provider. Make sure you discuss any questions you have with your health care provider. Document Released: 03/05/2009 Document Revised: 10/15/2015 Document Reviewed: 06/13/2014 Elsevier Interactive Patient Education  2017 [employer].

## 2021-12-28 ENCOUNTER — Other Ambulatory Visit: Payer: Self-pay | Admitting: Family Medicine

## 2021-12-28 DIAGNOSIS — J449 Chronic obstructive pulmonary disease, unspecified: Secondary | ICD-10-CM

## 2022-01-12 ENCOUNTER — Telehealth: Payer: Self-pay | Admitting: Family Medicine

## 2022-01-13 ENCOUNTER — Ambulatory Visit: Payer: Medicare Other | Admitting: Family Medicine

## 2022-01-25 ENCOUNTER — Ambulatory Visit: Payer: Medicare Other | Admitting: Family

## 2022-01-25 ENCOUNTER — Other Ambulatory Visit: Payer: Medicare Other

## 2022-03-23 ENCOUNTER — Other Ambulatory Visit: Payer: Self-pay | Admitting: *Deleted

## 2022-03-23 NOTE — Patient Outreach (Signed)
Per [facility] Mr. Domingo resides in [place]. Screening for potential [facility] care coordination services as benefit of insurance plan and PCP.   Secure communication sent to [facility] social worker to make aware writer is following for potential [facility] care coordination needs and transition plans.   Will continue to follow.    Marthenia Rolling, MSN, RN,BSN [place] Acute Care Coordinator [phone] (Direct dial)

## 2022-04-22 DEATH — deceased
